# Patient Record
Sex: Male | Born: 1970
Health system: Southern US, Community
[De-identification: ages and names within clinical notes are randomized; demographics above are authoritative.]

## PROBLEM LIST (undated history)

## (undated) DIAGNOSIS — E119 Type 2 diabetes mellitus without complications: Secondary | ICD-10-CM

## (undated) DIAGNOSIS — F32A Depression, unspecified: Secondary | ICD-10-CM

## (undated) DIAGNOSIS — T7840XA Allergy, unspecified, initial encounter: Secondary | ICD-10-CM

## (undated) DIAGNOSIS — E785 Hyperlipidemia, unspecified: Secondary | ICD-10-CM

## (undated) DIAGNOSIS — F419 Anxiety disorder, unspecified: Secondary | ICD-10-CM

## (undated) DIAGNOSIS — G43909 Migraine, unspecified, not intractable, without status migrainosus: Secondary | ICD-10-CM

## (undated) DIAGNOSIS — F329 Major depressive disorder, single episode, unspecified: Secondary | ICD-10-CM

## (undated) DIAGNOSIS — F4000 Agoraphobia, unspecified: Secondary | ICD-10-CM

## (undated) HISTORY — DX: Migraine, unspecified, not intractable, without status migrainosus: G43.909

## (undated) HISTORY — DX: Allergy, unspecified, initial encounter: T78.40XA

## (undated) HISTORY — DX: Agoraphobia, unspecified: F40.00

## (undated) HISTORY — DX: Hyperlipidemia, unspecified: E78.5

## (undated) HISTORY — DX: Depression, unspecified: F32.A

## (undated) HISTORY — DX: Major depressive disorder, single episode, unspecified: F32.9

## (undated) HISTORY — DX: Anxiety disorder, unspecified: F41.9

## (undated) HISTORY — PX: TONSILLECTOMY: SUR1361

## (undated) HISTORY — DX: Type 2 diabetes mellitus without complications: E11.9

---

## 2013-11-13 ENCOUNTER — Ambulatory Visit: Payer: Self-pay | Admitting: Family Medicine

## 2014-10-04 ENCOUNTER — Ambulatory Visit (INDEPENDENT_AMBULATORY_CARE_PROVIDER_SITE_OTHER): Payer: BLUE CROSS/BLUE SHIELD | Admitting: Family Medicine

## 2014-10-04 ENCOUNTER — Encounter: Payer: Self-pay | Admitting: Family Medicine

## 2014-10-04 VITALS — BP 126/81 | HR 64 | Temp 98.7°F | Ht 71.5 in | Wt 263.0 lb

## 2014-10-04 DIAGNOSIS — M79601 Pain in right arm: Secondary | ICD-10-CM

## 2014-10-04 DIAGNOSIS — E669 Obesity, unspecified: Secondary | ICD-10-CM

## 2014-10-04 DIAGNOSIS — E785 Hyperlipidemia, unspecified: Secondary | ICD-10-CM | POA: Insufficient documentation

## 2014-10-04 DIAGNOSIS — E1165 Type 2 diabetes mellitus with hyperglycemia: Secondary | ICD-10-CM | POA: Insufficient documentation

## 2014-10-04 DIAGNOSIS — E119 Type 2 diabetes mellitus without complications: Secondary | ICD-10-CM

## 2014-10-04 DIAGNOSIS — E1129 Type 2 diabetes mellitus with other diabetic kidney complication: Secondary | ICD-10-CM | POA: Insufficient documentation

## 2014-10-04 MED ORDER — ESCITALOPRAM OXALATE 20 MG PO TABS: 20.0000 mg | ORAL_TABLET | Freq: Every day | ORAL | Status: DC

## 2014-10-04 MED ORDER — ALPRAZOLAM 0.5 MG PO TABS: 0.5000 mg | ORAL_TABLET | Freq: Two times a day (BID) | ORAL | Status: DC | PRN

## 2014-10-04 MED ORDER — TRAZODONE HCL 50 MG PO TABS: 50.0000 mg | ORAL_TABLET | Freq: Every day | ORAL | Status: DC

## 2014-10-04 NOTE — Assessment & Plan Note (Signed)
Pt does not want medicine, even though he's aware chol higher than goal; he wants to try weight loss and activity; check lipids at next appt

## 2014-10-04 NOTE — Assessment & Plan Note (Signed)
He will aim for ten pounds of weight loss by next visit

## 2014-10-04 NOTE — Patient Instructions (Addendum)
Check out the relaxation response by Dr. Remonia Richter [email or web address] Do tell your eye doctor about your new diagnosis of diabetes Activity goal:  You've told me you are going to start walking so good luck with that and stay motivated Aim for 6,000 steps a day (if you have a FitBit or other device) or walk 1.5 miles a day Let's have you return for visit and labs in 3 months Do check your feet every night Aim for ten pounds of weight loss between now and your next visit, that's less than 1 pound per week Good luck! I'll refer you to physical therapy for the right shoulder/arm for evaluation and teaching  Diabetes Mellitus and Food It is important for you to manage your blood sugar (glucose) level. Your blood glucose level can be greatly affected by what you eat. Eating healthier foods in the appropriate amounts throughout the day at about the same time each day will help you control your blood glucose level. It can also help slow or prevent worsening of your diabetes mellitus. Healthy eating may even help you improve the level of your blood pressure and reach or maintain a healthy weight.  HOW CAN FOOD AFFECT ME? Carbohydrates Carbohydrates affect your blood glucose level more than any other type of food. Your dietitian will help you determine how many carbohydrates to eat at each meal and teach you how to count carbohydrates. Counting carbohydrates is important to keep your blood glucose at a healthy level, especially if you are using insulin or taking certain medicines for diabetes mellitus. Alcohol Alcohol can cause sudden decreases in blood glucose (hypoglycemia), especially if you use insulin or take certain medicines for diabetes mellitus. Hypoglycemia can be a life-threatening condition. Symptoms of hypoglycemia (sleepiness, dizziness, and disorientation) are similar to symptoms of having too much alcohol.  If your health care provider has given you approval to  drink alcohol, do so in moderation and use the following guidelines:  Women should not have more than one drink per day, and men should not have more than two drinks per day. One drink is equal to:  12 oz of beer.  5 oz of wine.  1 oz of hard liquor.  Do not drink on an empty stomach.  Keep yourself hydrated. Have water, diet soda, or unsweetened iced tea.  Regular soda, juice, and other mixers might contain a lot of carbohydrates and should be counted. WHAT FOODS ARE NOT RECOMMENDED? As you make food choices, it is important to remember that all foods are not the same. Some foods have fewer nutrients per serving than other foods, even though they might have the same number of calories or carbohydrates. It is difficult to get your body what it needs when you eat foods with fewer nutrients. Examples of foods that you should avoid that are high in calories and carbohydrates but low in nutrients include:  Trans fats (most processed foods list trans fats on the Nutrition Facts label).  Regular soda.  Juice.  Candy.  Sweets, such as cake, pie, doughnuts, and cookies.  Fried foods. WHAT FOODS CAN I EAT? Have nutrient-rich foods, which will nourish your body and keep you healthy. The food you should eat also will depend on several factors, including:  The calories you need.  The medicines you take.  Your weight.  Your blood glucose level.  Your blood pressure level.  Your cholesterol level. You also should eat a variety of foods, including:  Protein, such as meat, poultry, fish, tofu,  nuts, and seeds (lean animal proteins are best).  Fruits.  Vegetables.  Dairy products, such as milk, cheese, and yogurt (low fat is best).  Breads, grains, pasta, cereal, rice, and beans.  Fats such as olive oil, trans fat-free margarine, canola oil, avocado, and olives. DOES EVERYONE WITH DIABETES MELLITUS HAVE THE SAME MEAL PLAN? Because every person with diabetes mellitus is  different, there is not one meal plan that works for everyone. It is very important that you meet with a dietitian who will help you create a meal plan that is just right for you. Document Released: 01/01/2005 Document Revised: 04/11/2013 Document Reviewed: 03/03/2013 [facility] Patient Information 2015 [place], [place]. This information is not intended to replace advice given to you by your health care provider. Make sure you discuss any questions you have with your health care provider.

## 2014-10-04 NOTE — Assessment & Plan Note (Signed)
Importance of foot exam discussed; foot exam by MD today done; pt will see his eye doctor in Sept or Oct and let him/her know of new diagnosis; A1C at next visit; he'll work on weight loss and activity and healthier eating

## 2014-10-04 NOTE — Progress Notes (Signed)
BP 126/81 mmHg  Pulse 64  Temp(Src) 98.7 F (37.1 C)  Ht 5' 11.5" (1.816 m)  Wt 263 lb (119.296 kg)  BMI 36.17 kg/m2  SpO2 96%   Subjective:    Patient ID: Cipriano Mile, male    DOB: 1971-04-13, 44 y.o.   MRN: [id number]  HPI: Raesean Bartoletti is a 44 y.o. male  Chief Complaint  Patient presents with  . Diabetes  . Depression   Diabetes mellitus type 2 Patient has had diabetes since March 2016 Does patient feel additional teaching/training would be helpful?  yes but expensive so he never went to the diabetic educator Trying to limit white bread, white rice, white potatoes, sweets?  no, it's hit or miss, likes mashed potatoes Trying to limit sweetened drinks like iced tea, soft drinks, sports drinks, fruit juices?  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 and doesn't want to work Last eye exam:  Wears contacts, due in Sept or October Diabetes-associated complications:  none Struggles include eating out, back to eating what he wants  High cholesterol; he does not want medicine; he wants to try weight loss and healthier eating  Anxiety; used to have a serious phobia of needles and didn't even have doctor's visits for over a decade; this past week with the sunshine was fine; was having more anxiety with working short-handed and more responsibility at work; went two or three days without medicine  Relevant past medical, surgical, family and social history reviewed and updated as indicated. Interim medical history since our last visit reviewed. Allergies and medications reviewed and updated.  Review of Systems  Constitutional: Negative for unexpected weight change.  Musculoskeletal:       Pain in the right arm, head of the tricep, sore with certain movements; going on for months; happened a  long time ago and hurt for a year; can't reach behind his back; can't reach up ahead; last time it happened was 2006 or so  Neurological: Negative for numbness.  Psychiatric/Behavioral: The patient is nervous/anxious.    Per HPI unless specifically indicated above     Objective:    BP 126/81 mmHg  Pulse 64  Temp(Src) 98.7 F (37.1 C)  Ht 5' 11.5" (1.816 m)  Wt 263 lb (119.296 kg)  BMI 36.17 kg/m2  SpO2 96%  Wt Readings from Last 3 Encounters:  10/04/14 263 lb (119.296 kg)  November 13, 2068 261 lb (118.389 kg) (100 %*, Z = 48.47)   * Growth percentiles are based on WHO (Boys, 0-2 years) data.    Body mass index is 36.17 kg/(m^2). Body mass index is 36.17 kg/(m^2). Body mass index is 36.17 kg/(m^2). Body mass index is 36.17 kg/(m^2).  Physical Exam  Constitutional: He appears well-developed and well-nourished. No distress.  HENT:  Head: Normocephalic and atraumatic.  Nose: No rhinorrhea.  Mouth/Throat: Mucous membranes are normal.  Eyes: EOM are normal. No scleral icterus.  Neck: No JVD present.  Cardiovascular: Normal rate, regular rhythm and normal heart sounds.   No murmur heard. Pulmonary/Chest: Effort normal and breath sounds normal. No respiratory distress. He has no wheezes.  Abdominal: Soft. Bowel sounds are normal. He exhibits no distension. There is no tenderness.  Musculoskeletal: He exhibits no edema.  Limited ROM with abduction of right arm and reaching behind back  Neurological: He is alert.  He displays no tremor. He exhibits normal muscle tone.  Strength UE 5/5  Skin: Skin is warm and dry. No rash noted. He is not diaphoretic. No cyanosis.  Psychiatric: He has a normal mood and affect. His behavior is normal. Judgment and thought content normal.  Nursing note and vitals reviewed.  Diabetic Foot Form - Detailed   Diabetic Foot Exam - detailed  Diabetic Foot exam was performed with the following findings:  Yes 10/04/2014  9:13 AM  Visual Foot Exam completed.:  Yes   Are the toenails long?:  No  Are the toenails thick?:  No    Pulse Foot Exam completed.:  Yes  Right Dorsalis Pedis:  Present Left Dorsalis Pedis:  Present  Sensory Foot Exam Completed.:  Yes  Semmes-Weinstein Monofilament Test  R Site 1-Great Toe:  Pos L Site 1-Great Toe:  Pos    Comments:  Normal to vibration testing as well     No results found for this or any previous visit.    Assessment & Plan:   Problem List Items Addressed This Visit      Endocrine   Type 2 diabetes mellitus - Primary    Importance of foot exam discussed; foot exam by MD today done; pt will see his eye doctor in Sept or Oct and let him/her know of new diagnosis; A1C at next visit; he'll work on weight loss and activity and healthier eating        Other   Hyperlipidemia    Pt does not want medicine, even though he's aware chol higher than goal; he wants to try weight loss and activity; check lipids at next appt      Obesity    He will aim for ten pounds of weight loss by next visit       Other Visit Diagnoses    Right arm pain        likely rotator cuff pathology; recommended referral to PT for evaluation and learning home exercises    Relevant Orders    Ambulatory referral to Physical Therapy        Follow up plan: Return in about 3 months (around 01/04/2015) for fasting labs and visit with Dr. Sanda Klein.

## 2014-10-31 ENCOUNTER — Ambulatory Visit: Payer: BLUE CROSS/BLUE SHIELD

## 2014-11-06 ENCOUNTER — Ambulatory Visit: Payer: BLUE CROSS/BLUE SHIELD

## 2015-01-03 ENCOUNTER — Encounter: Payer: Self-pay | Admitting: Family Medicine

## 2015-01-03 ENCOUNTER — Ambulatory Visit (INDEPENDENT_AMBULATORY_CARE_PROVIDER_SITE_OTHER): Payer: BLUE CROSS/BLUE SHIELD | Admitting: Family Medicine

## 2015-01-03 VITALS — BP 119/76 | HR 63 | Temp 98.4°F | Ht 70.3 in | Wt 258.0 lb

## 2015-01-03 DIAGNOSIS — E119 Type 2 diabetes mellitus without complications: Secondary | ICD-10-CM

## 2015-01-03 DIAGNOSIS — F419 Anxiety disorder, unspecified: Secondary | ICD-10-CM | POA: Diagnosis not present

## 2015-01-03 DIAGNOSIS — R0982 Postnasal drip: Secondary | ICD-10-CM

## 2015-01-03 DIAGNOSIS — B001 Herpesviral vesicular dermatitis: Secondary | ICD-10-CM

## 2015-01-03 DIAGNOSIS — E785 Hyperlipidemia, unspecified: Secondary | ICD-10-CM

## 2015-01-03 DIAGNOSIS — E669 Obesity, unspecified: Secondary | ICD-10-CM

## 2015-01-03 DIAGNOSIS — R059 Cough, unspecified: Secondary | ICD-10-CM

## 2015-01-03 DIAGNOSIS — Z23 Encounter for immunization: Secondary | ICD-10-CM

## 2015-01-03 DIAGNOSIS — Z5181 Encounter for therapeutic drug level monitoring: Secondary | ICD-10-CM

## 2015-01-03 DIAGNOSIS — R05 Cough: Secondary | ICD-10-CM | POA: Diagnosis not present

## 2015-01-03 MED ORDER — ALPRAZOLAM 0.5 MG PO TABS: 0.5000 mg | ORAL_TABLET | Freq: Two times a day (BID) | ORAL | Status: DC | PRN

## 2015-01-03 NOTE — Assessment & Plan Note (Signed)
Lingering after sinus infection; try nasal corticosteroid, but call if cough persists after 1-2 weeks and we'll get CXR; patient agrees

## 2015-01-03 NOTE — Assessment & Plan Note (Signed)
Congratulated patient on weight loss; five pounds since last visit; keep up the great job

## 2015-01-03 NOTE — Assessment & Plan Note (Signed)
Check lipids today; avoid saturated fats 

## 2015-01-03 NOTE — Assessment & Plan Note (Signed)
Consider nasal corticosteroid

## 2015-01-03 NOTE — Assessment & Plan Note (Signed)
Offered topical; he declined and will use up what he has from before

## 2015-01-03 NOTE — Assessment & Plan Note (Signed)
Continue SSRI and intermittent benzo; no concern at this time for misuse or diversion; no alcohol with benzo; may return in 6 months

## 2015-01-03 NOTE — Assessment & Plan Note (Addendum)
Check A1C today; weight loss encouraged; foot exam done by MD today; limit sweets and simple carbs; yearly eye exam; offered and gave flu shot and PPSV-23

## 2015-01-03 NOTE — Progress Notes (Signed)
BP 119/76 mmHg  Pulse 63  Temp(Src) 98.4 F (36.9 C)  Ht 5' 10.3" (1.786 m)  Wt 258 lb (117.028 kg)  BMI 36.69 kg/m2  SpO2 96%   Subjective:    Patient ID: Glenn Flynn, male    DOB: 03/06/1971, 44 y.o.   MRN: [id number]  HPI: Glenn Flynn is a 44 y.o. male  Chief Complaint  Patient presents with  . Diabetes  . Hyperlipidemia   Patient is here for follow-up of diabetes Diabetes mellitus type 2 Checking sugars?  no Does patient feel additional teaching/training would be helpful?  no; he just needs to discipline himself Trying to limit white bread, white rice, white potatoes, sweets?  yes, but girlfriend has a sweet tooth Trying to limit sweetened drinks like iced tea, soft drinks, sports drinks, fruit juices?  no; drinks sweet tea, that's his biggest thing he says, occasional soft drink Last eye exam:  One year ago, he'll schedule Diabetes-associated complications:  none  Both parents are diabetic  High cholesterol Dietary intake: Do you eat more than 3 eggs per week  no Do you try to limit saturated fats in your diet?  yes; does a variety of meals through the week, but does eat some a few times a week Exercise: Exercise/activity level:  seldom (a few times a month) Does high cholesterol run in your family?   YES, probably Weight:  If overweight or obese, are you trying to lose weight?  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 Interim medical history since our last visit  reviewed. Allergies and medications reviewed and updated.  Review of Systems  Constitutional: Negative for unexpected weight change.  HENT: Positive for postnasal drip.   Respiratory: Positive for cough.   Cardiovascular: Negative for chest pain.  Neurological: Negative for numbness.   Per HPI unless specifically indicated above     Objective:    BP 119/76 mmHg  Pulse 63  Temp(Src) 98.4 F (36.9 C)  Ht 5' 10.3" (1.786 m)  Wt 258 lb (117.028 kg)  BMI 36.69 kg/m2  SpO2 96%  Wt Readings from Last 3 Encounters:  01/03/15 258 lb (117.028 kg)  10/04/14 263 lb (119.296 kg)  03/09/69 261 lb (118.389 kg) (100 %*, Z = 48.47)   * Growth percentiles are based on WHO (Boys, 0-2 years) data.    Physical Exam  Constitutional: He appears well-developed and well-nourished. No distress.  HENT:  Head: Normocephalic and atraumatic.  Eyes: EOM are normal. No scleral icterus.  Neck: No thyromegaly present.  Cardiovascular: Normal rate and regular rhythm.   Pulmonary/Chest: Effort normal and breath sounds normal.  Abdominal: Soft. Bowel sounds are normal. He exhibits no distension.  Musculoskeletal: He exhibits no edema.  Neurological: Coordination normal.  Skin: Skin is warm and dry. Lesion (scabbed lesion left side lower lip) noted. No pallor.  Psychiatric: He has a normal mood and affect. His behavior is normal. Judgment and thought content normal.   Diabetic Foot Form - Detailed  Diabetic Foot Exam - detailed  Diabetic Foot exam was performed with the following findings:  Yes 01/03/2015  9:03 AM  Visual Foot Exam completed.:  Yes  Are the shoes appropriate in style and fit?:  Yes  Are the toenails long?:  No  Are the toenails thick?:  No  Is there a claw toe deformity?:  No  Are the toenails ingrown?:  No    Pulse Foot Exam completed.:  Yes  Right Dorsalis Pedis:  Present Left Dorsalis Pedis:  Present  Sensory Foot Exam Completed.:  Yes  Swelling:  No  Semmes-Weinstein  Monofilament Test  R Site 1-Great Toe:  Pos L Site 1-Great Toe:  Pos  R Site 4:  Pos L Site 4:  Pos  R Site 5:  Pos L Site 5:  Pos        No results found for this or any previous visit.    Assessment & Plan:   Problem List Items Addressed This Visit      Digestive   Fever blister    Offered topical; he declined and will use up what he has from before        Endocrine   Type 2 diabetes mellitus - Primary    Check A1C today; weight loss encouraged; foot exam done by MD today; limit sweets and simple carbs; yearly eye exam; offered and gave flu shot and PPSV-23      Relevant Orders   Hgb A1c w/o eAG     Other   Hyperlipidemia    Check lipids today; avoid saturated fats      Relevant Orders   Lipid Panel w/o Chol/HDL Ratio   Obesity    Congratulated patient on weight loss; five pounds since last visit; keep up the great job      Cough    Lingering after sinus infection; try nasal corticosteroid, but call if cough persists after 1-2 weeks and we'll get CXR; patient agrees      Postnasal drip    Consider nasal corticosteroid      Anxiety disorder    Continue SSRI and intermittent benzo; no concern at this time for misuse or diversion; no alcohol with benzo; may return in 6 months       Other Visit Diagnoses    Immunization due        Relevant Orders    Flu vaccine greater than or equal to 3yo preservative free IM (Completed)    Pneumococcal polysaccharide vaccine 23-valent greater than or equal to 2yo subcutaneous/IM (Completed)    Medication monitoring encounter        Relevant Orders    Comprehensive metabolic panel    Lipid Panel w/o Chol/HDL Ratio       Meds ordered this encounter  Medications  . ALPRAZolam (XANAX) 0.5 MG tablet    Sig: Take 1 tablet (0.5 mg total) by mouth 2 (two) times daily as needed for anxiety.    Dispense:  30 tablet    Refill:  2     Follow up plan: Return in about 6 months (around 07/03/2015) for diabetes, cholesterol,  anxiety.

## 2015-01-03 NOTE — Patient Instructions (Addendum)
PPSV-23 given today; pneumonia vaccine; you will not need another pneumonia vaccine until you turn 65 Flu shot given today; next due next fall, 2017 We'll contact you about the labs Keep up the good efforts and healthy lifestyle change Do schedule an eye exam and see your eye doctor yearly Call me for a chest xray if not getting better in 1-2 weeks Consider using nasal spray in case you are having postnasal drip Do not drink any alcohol within four hours of using your anxiety medicine (alprazolam) and do not mix that EVER with any narcotic pain medicine (in case you are given a narcotic for orthopaedic, dental, surgical issue); mixing benzodiazepines like alprazolam and narcotic pain medicine can result in respiratory depression and death Try to increase your activity level, building up gradually to 150 minutes per week of moderate intensity activity Return in 6 months

## 2015-01-04 LAB — COMPREHENSIVE METABOLIC PANEL
A/G RATIO: 1.6 (ref 1.1–2.5)
ALBUMIN: 4.2 g/dL (ref 3.5–5.5)
ALT: 41 IU/L (ref 0–44)
AST: 24 IU/L (ref 0–40)
Alkaline Phosphatase: 68 IU/L (ref 39–117)
BUN / CREAT RATIO: 19 (ref 9–20)
BUN: 18 mg/dL (ref 6–24)
Bilirubin Total: 0.6 mg/dL (ref 0.0–1.2)
CALCIUM: 9.7 mg/dL (ref 8.7–10.2)
CO2: 23 mmol/L (ref 18–29)
CREATININE: 0.93 mg/dL (ref 0.76–1.27)
Chloride: 100 mmol/L (ref 97–108)
GFR, EST AFRICAN AMERICAN: 114 mL/min/{1.73_m2} (ref >59)
GFR, EST NON AFRICAN AMERICAN: 99 mL/min/{1.73_m2} (ref >59)
GLOBULIN, TOTAL: 2.6 g/dL (ref 1.5–4.5)
Glucose: 142 mg/dL — ABNORMAL HIGH (ref 65–99)
POTASSIUM: 5.1 mmol/L (ref 3.5–5.2)
SODIUM: 138 mmol/L (ref 134–144)
Total Protein: 6.8 g/dL (ref 6.0–8.5)

## 2015-01-04 LAB — LIPID PANEL W/O CHOL/HDL RATIO
Cholesterol, Total: 220 mg/dL — ABNORMAL HIGH (ref 100–199)
HDL: 45 mg/dL (ref >39)
LDL CALC: 142 mg/dL — AB (ref 0–99)
TRIGLYCERIDES: 166 mg/dL — AB (ref 0–149)
VLDL Cholesterol Cal: 33 mg/dL (ref 5–40)

## 2015-01-04 LAB — HGB A1C W/O EAG: HEMOGLOBIN A1C: 6.7 % — AB (ref 4.8–5.6)

## 2015-01-15 ENCOUNTER — Telehealth: Payer: Self-pay | Admitting: Family Medicine

## 2015-01-15 NOTE — Telephone Encounter (Signed)
I called to discuss lab results; reached identified voicemail; will call him another time A1C shows good control at 6.7; suggest metformin to help with weight loss, glucose control LDL too high; suggest starting statin, LDL 142 CMP okay except for glucose

## 2015-01-23 NOTE — Telephone Encounter (Signed)
I left another message 

## 2015-01-25 NOTE — Telephone Encounter (Signed)
I left another message I keep missing him, so I asked him to call me Monday here at the office and staff may pull me from a room

## 2015-01-29 ENCOUNTER — Encounter: Payer: Self-pay | Admitting: Family Medicine

## 2015-01-29 MED ORDER — ATORVASTATIN CALCIUM 20 MG PO TABS: 20.0000 mg | ORAL_TABLET | Freq: Every day | ORAL | Status: DC

## 2015-01-29 MED ORDER — METFORMIN HCL ER 500 MG PO TB24: 500.0000 mg | ORAL_TABLET | Freq: Every day | ORAL | Status: DC

## 2015-01-29 NOTE — Telephone Encounter (Signed)
I have not heard back from this patient, so I am going to send him a letter

## 2015-02-01 NOTE — Telephone Encounter (Signed)
Left message; will call in rx incase he did not get the letter.

## 2015-02-01 NOTE — Telephone Encounter (Signed)
Pt would like to try the medication that was suggested and would like it to go to [facility].

## 2015-02-01 NOTE — Telephone Encounter (Signed)
I stapled the prescription to the back of his letter personally, so I'm not sure what happened; you can call it in to pharmacy if something happened to that Rx

## 2015-02-01 NOTE — Telephone Encounter (Signed)
Dr. Sanda Klein, patient called back, would like to start medicine.

## 2015-02-01 NOTE — Telephone Encounter (Signed)
rx called into pharmacy

## 2015-04-03 ENCOUNTER — Encounter: Payer: Self-pay | Admitting: Family Medicine

## 2015-04-03 NOTE — Telephone Encounter (Signed)
Routing to provider  

## 2015-04-18 ENCOUNTER — Ambulatory Visit (INDEPENDENT_AMBULATORY_CARE_PROVIDER_SITE_OTHER): Payer: BLUE CROSS/BLUE SHIELD | Admitting: Family Medicine

## 2015-04-18 ENCOUNTER — Encounter: Payer: Self-pay | Admitting: Family Medicine

## 2015-04-18 VITALS — BP 131/84 | HR 72 | Temp 97.2°F | Ht 71.0 in | Wt 263.0 lb

## 2015-04-18 DIAGNOSIS — E119 Type 2 diabetes mellitus without complications: Secondary | ICD-10-CM

## 2015-04-18 DIAGNOSIS — R200 Anesthesia of skin: Secondary | ICD-10-CM

## 2015-04-18 DIAGNOSIS — E785 Hyperlipidemia, unspecified: Secondary | ICD-10-CM

## 2015-04-18 DIAGNOSIS — E669 Obesity, unspecified: Secondary | ICD-10-CM

## 2015-04-18 DIAGNOSIS — F411 Generalized anxiety disorder: Secondary | ICD-10-CM

## 2015-04-18 DIAGNOSIS — R202 Paresthesia of skin: Secondary | ICD-10-CM

## 2015-04-18 MED ORDER — METFORMIN HCL ER 500 MG PO TB24: 500.0000 mg | ORAL_TABLET | Freq: Every day | ORAL | Status: DC

## 2015-04-18 MED ORDER — ALPRAZOLAM 0.5 MG PO TABS: 0.5000 mg | ORAL_TABLET | Freq: Two times a day (BID) | ORAL | Status: DC | PRN

## 2015-04-18 NOTE — Progress Notes (Signed)
BP 131/84 mmHg  Pulse 72  Temp(Src) 97.2 F (36.2 C)  Ht 5\' 11"  (1.803 m)  Wt 263 lb (119.296 kg)  BMI 36.70 kg/m2  SpO2 97%   Subjective:    Patient ID: Glenn Flynn, male    DOB: 05/04/1970, 44 y.o.   MRN: [id number]  HPI: Glenn Flynn is a 44 y.o. male  Chief Complaint  Patient presents with  . Diabetes    I need refills on my meds  . Hyperlipidemia  . Anxiety   Diabetes; not much dry mouth; not getting up at night to urinate; no nocturia; does try to limit sweets, but still drinks sweet tea with dinner, coffee with sugar substitutes, milk and carnation instant breakfast; not much bread, fiance buys the bread, he can go either way; we offered referral to diabetic educator, but he politely declined because of cost; taking metformin but forgets sometimes  High cholesterol; reviewed last labs; occasional eggs on the weekend; he did not refill the cholesterol medicine; he'd like to see the next labs  Tingling in the feet at times, might be positional; it seems to happen when he is sitting in higher chairs, may be more pressure against the backs of his legs/knees; no tingling around the mouth  Anxiety;  GAD 7 : Generalized Anxiety Score 04/18/2015  Nervous, Anxious, on Edge 3  Control/stop worrying 1  Worry too much - different things 1  Trouble relaxing 0  Restless 0  Easily annoyed or irritable 0  Afraid - awful might happen 0  Total GAD 7 Score 5  Anxiety Difficulty Somewhat difficult  anxiety seems to be holding steady, not any better, not any worse; sometimes dreading going to work, interactions with people He has tried counseling, did not work as he hoped He actually tried the rapid eye movement treatment, did not have good connection Taking medicine, no side effects from that Takes medicine every morning; he thinks it is as good as it can be now Using lexapro and trazodone and those are working well  Relevant past medical, surgical, family and social  history reviewed and updated as indicated. Interim medical history since our last visit reviewed. Allergies and medications reviewed and updated.  Review of Systems Per HPI unless specifically indicated above     Objective:    BP 131/84 mmHg  Pulse 72  Temp(Src) 97.2 F (36.2 C)  Ht 5\' 11"  (1.803 m)  Wt 263 lb (119.296 kg)  BMI 36.70 kg/m2  SpO2 97%  Wt Readings from Last 3 Encounters:  04/18/15 263 lb (119.296 kg)  01/03/15 258 lb (117.028 kg)  10/04/14 263 lb (119.296 kg)    Physical Exam  Constitutional: He appears well-developed and well-nourished. No distress.  Obese, weight gain of 5 pounds in last 3 months  HENT:  Head: Normocephalic and atraumatic.  Mouth/Throat: Mucous membranes are normal.  Eyes: EOM are normal. No scleral icterus.  Neck: No thyromegaly present.  Cardiovascular: Normal rate and regular rhythm.   Pulmonary/Chest: Effort normal and breath sounds normal.  Abdominal: Soft. Bowel sounds are normal. He exhibits no distension.  Musculoskeletal: He exhibits no edema.  Neurological: He displays no tremor. Coordination normal.  Skin: Skin is warm and dry. No rash noted. No pallor.  Psychiatric: He has a normal mood and affect. His behavior is normal. Judgment and thought content normal.  Good eye contact with examiner   Diabetic Foot Form - Detailed   Diabetic Foot Exam - detailed  Diabetic Foot exam  was performed with the following findings:  Yes 04/18/2015  5:13 PM  Visual Foot Exam completed.:  Yes  Are the toenails long?:  No  Are the toenails thick?:  No  Is there a claw toe deformity?:  No  Is there elevated skin temparature?:  No  Is there limited skin dorsiflexion?:  No  Are the toenails ingrown?:  No  Normal Range of Motion:  Yes    Pulse Foot Exam completed.:  Yes  Right Dorsalis Pedis:  Present Left Dorsalis Pedis:  Present  Sensory Foot Exam Completed.:  Yes  Swelling:  No  Semmes-Weinstein Monofilament Test  R Site 1-Great Toe:   Pos L Site 1-Great Toe:  Pos  R Site 4:  Pos L Site 4:  Pos  R Site 5:  Pos L Site 5:  Pos       Results for orders placed or performed in visit on 01/03/15  Comprehensive metabolic panel  Result Value Ref Range   Glucose 142 (H) 65 - 99 mg/dL   BUN 18 6 - 24 mg/dL   Creatinine, Ser 0.93 0.76 - 1.27 mg/dL   GFR calc non Af Amer 99 >59 mL/min/1.73   GFR calc Af Amer 114 >59 mL/min/1.73   BUN/Creatinine Ratio 19 9 - 20   Sodium 138 134 - 144 mmol/L   Potassium 5.1 3.5 - 5.2 mmol/L   Chloride 100 97 - 108 mmol/L   CO2 23 18 - 29 mmol/L   Calcium 9.7 8.7 - 10.2 mg/dL   Total Protein 6.8 6.0 - 8.5 g/dL   Albumin 4.2 3.5 - 5.5 g/dL   Globulin, Total 2.6 1.5 - 4.5 g/dL   Albumin/Globulin Ratio 1.6 1.1 - 2.5   Bilirubin Total 0.6 0.0 - 1.2 mg/dL   Alkaline Phosphatase 68 39 - 117 IU/L   AST 24 0 - 40 IU/L   ALT 41 0 - 44 IU/L  Lipid Panel w/o Chol/HDL Ratio  Result Value Ref Range   Cholesterol, Total 220 (H) 100 - 199 mg/dL   Triglycerides 166 (H) 0 - 149 mg/dL   HDL 45 >39 mg/dL   VLDL Cholesterol Cal 33 5 - 40 mg/dL   LDL Calculated 142 (H) 0 - 99 mg/dL  Hgb A1c w/o eAG  Result Value Ref Range   Hgb A1c MFr Bld 6.7 (H) 4.8 - 5.6 %      Assessment & Plan:   Problem List Items Addressed This Visit      Endocrine   Type 2 diabetes mellitus ([place])    Foot exam by MD today; patient to check feet nightly; start aspirin therapy 81 mg coated daily (msg left for patient about this, realized he was not on aspirin after visit was over); last A1c reviewed; next A1c in 3 months; weight loss and healthy eating encouraged      Relevant Medications   metFORMIN (GLUCOPHAGE-XR) 500 MG 24 hr tablet   aspirin EC 81 MG tablet     Other   Hyperlipidemia    Check fasting lipids at f/u appt in 3 months; goal LDL under 100 at least; goal HDL 40+, goal TG less than 150; weight loss and healthier eating should help      Relevant Medications   aspirin EC 81 MG tablet   Obesity     Encouragement given for patient to start to get his weight back down; if he begins to pick up his activity, start low and build up slowly  Relevant Medications   metFORMIN (GLUCOPHAGE-XR) 500 MG 24 hr tablet   Anxiety disorder - Primary    Patient seems to be stable on current medicine regimen; he is not particular interested in counseling; suggested other options such as yoga; okay for refills prior to next appt      Numbness and tingling of both legs below knees    This sounds positional, as it occurs after seated in higher chairs; distal sensation intact in both feet; not typical for diabetic neuropathy; try changing positions, see if improves         Follow up plan: Return in 3 months (on 07/04/2015) for thirty minute follow-up with fasting labs.  Meds ordered this encounter  Medications  . ALPRAZolam (XANAX) 0.5 MG tablet    Sig: Take 1 tablet (0.5 mg total) by mouth 2 (two) times daily as needed for anxiety.    Dispense:  30 tablet    Refill:  2  . metFORMIN (GLUCOPHAGE-XR) 500 MG 24 hr tablet    Sig: Take 1 tablet (500 mg total) by mouth daily with supper.    Dispense:  30 tablet    Refill:  2  . aspirin EC 81 MG tablet    Sig: Take 1 tablet (81 mg total) by mouth daily.

## 2015-04-18 NOTE — Patient Instructions (Addendum)
Try to limit saturated fats in your diet (bologna, hot dogs, barbeque, cheeseburgers, hamburgers, steak, bacon, sausage, cheese, etc.) and get more fresh fruits, vegetables, and whole grains Limit eggs to no more than three per week if you can Return for fasting labs and visit on July 04, 2015

## 2015-04-20 DIAGNOSIS — R202 Paresthesia of skin: Secondary | ICD-10-CM

## 2015-04-20 DIAGNOSIS — R200 Anesthesia of skin: Secondary | ICD-10-CM | POA: Insufficient documentation

## 2015-04-20 MED ORDER — ASPIRIN EC 81 MG PO TBEC: 81.0000 mg | DELAYED_RELEASE_TABLET | Freq: Every day | ORAL | Status: DC

## 2015-04-20 NOTE — Assessment & Plan Note (Signed)
Encouragement given for patient to start to get his weight back down; if he begins to pick up his activity, start low and build up slowly

## 2015-04-20 NOTE — Assessment & Plan Note (Signed)
This sounds positional, as it occurs after seated in higher chairs; distal sensation intact in both feet; not typical for diabetic neuropathy; try changing positions, see if improves

## 2015-04-20 NOTE — Assessment & Plan Note (Signed)
Foot exam by MD today; patient to check feet nightly; start aspirin therapy 81 mg coated daily (msg left for patient about this, realized he was not on aspirin after visit was over); last A1c reviewed; next A1c in 3 months; weight loss and healthy eating encouraged

## 2015-04-20 NOTE — Assessment & Plan Note (Signed)
Patient seems to be stable on current medicine regimen; he is not particular interested in counseling; suggested other options such as yoga; okay for refills prior to next appt

## 2015-04-20 NOTE — Assessment & Plan Note (Signed)
Check fasting lipids at f/u appt in 3 months; goal LDL under 100 at least; goal HDL 40+, goal TG less than 150; weight loss and healthier eating should help

## 2015-07-04 ENCOUNTER — Encounter: Payer: Self-pay | Admitting: Family Medicine

## 2015-07-04 ENCOUNTER — Ambulatory Visit (INDEPENDENT_AMBULATORY_CARE_PROVIDER_SITE_OTHER): Payer: BLUE CROSS/BLUE SHIELD | Admitting: Family Medicine

## 2015-07-04 VITALS — BP 135/83 | HR 67 | Temp 97.5°F | Ht 70.5 in | Wt 269.0 lb

## 2015-07-04 DIAGNOSIS — Z5181 Encounter for therapeutic drug level monitoring: Secondary | ICD-10-CM | POA: Diagnosis not present

## 2015-07-04 DIAGNOSIS — F411 Generalized anxiety disorder: Secondary | ICD-10-CM | POA: Diagnosis not present

## 2015-07-04 DIAGNOSIS — E119 Type 2 diabetes mellitus without complications: Secondary | ICD-10-CM | POA: Diagnosis not present

## 2015-07-04 DIAGNOSIS — E785 Hyperlipidemia, unspecified: Secondary | ICD-10-CM

## 2015-07-04 DIAGNOSIS — G4700 Insomnia, unspecified: Secondary | ICD-10-CM | POA: Diagnosis not present

## 2015-07-04 DIAGNOSIS — R5383 Other fatigue: Secondary | ICD-10-CM | POA: Diagnosis not present

## 2015-07-04 DIAGNOSIS — E669 Obesity, unspecified: Secondary | ICD-10-CM | POA: Diagnosis not present

## 2015-07-04 DIAGNOSIS — Z79899 Other long term (current) drug therapy: Secondary | ICD-10-CM | POA: Insufficient documentation

## 2015-07-04 MED ORDER — ALPRAZOLAM 0.5 MG PO TABS: 0.5000 mg | ORAL_TABLET | Freq: Two times a day (BID) | ORAL | Status: DC | PRN

## 2015-07-04 MED ORDER — TRAZODONE HCL 100 MG PO TABS: 100.0000 mg | ORAL_TABLET | Freq: Every day | ORAL | Status: DC

## 2015-07-04 NOTE — Assessment & Plan Note (Signed)
Continue SSRI and benzo

## 2015-07-04 NOTE — Assessment & Plan Note (Signed)
He will try to lose weight and increase activity level

## 2015-07-04 NOTE — Assessment & Plan Note (Signed)
Will check TSH, CBC, vit D; he has already had sleep study without significant weight gain he says; he'll check with fiance to see if any snoring or sleep apnea

## 2015-07-04 NOTE — Progress Notes (Signed)
BP 135/83 mmHg  Pulse 67  Temp(Src) 97.5 F (36.4 C)  Ht 5' 10.5" (1.791 m)  Wt 269 lb (122.018 kg)  BMI 38.04 kg/m2  SpO2 95%   Subjective:    Patient ID: Glenn Flynn, male    DOB: 1970/11/06, 45 y.o.   MRN: [id number]  HPI: Glenn Flynn is a 45 y.o. male  Chief Complaint  Patient presents with  . Diabetes    follow up and labs  . Hyperlipidemia    follow up and labs  . Obesity    he's gained 6 lbs since his last visit   Type 2 diabetes; not checking FSBS; he did not do the education classes; he is not doing a good job with eating; he thinks it is just ingrained and orders and buys what he wants; started on aspirin; no problems with  Blood pressure has gone up; does not add salt to anything much; does not look at sodium content; more of a hot sauce and pepper; not exercising regularly; will try to get out walking out again  Depression and anxiety have been elevated; not sure if putting on weight because of anxiety and depression, or added weight worsening the anxiety; stomach has been bothering him, more frequent stools, not eating healthy; on escitalopram 20 mg daily; takes the alprazolam every morning (works in [employer]); takes the second one only if needed; not sedating at all, very gradual; he has tried counseling in the past, and he'd like to work that back into the schedule; didn't get much out of it before; he has never slept well, trouble as a kid keeping his eyes open getting on the bus; trouble staying awake 1st period classes; trazodone helps but never wakes up rested; he has not been told he snores, but then says his fiance has mentioned it; he has already had a sleep study; did not find sleep apnea, no rapid leg movement, no difficulty breathing; more mental he says  Relevant past medical, surgical, family and social history reviewed and updated as indicated. Interim medical history since our last visit reviewed. Allergies and medications reviewed and  updated.  Review of Systems Per HPI unless specifically indicated above     Objective:    BP 135/83 mmHg  Pulse 67  Temp(Src) 97.5 F (36.4 C)  Ht 5' 10.5" (1.791 m)  Wt 269 lb (122.018 kg)  BMI 38.04 kg/m2  SpO2 95%  Wt Readings from Last 3 Encounters:  07/04/15 269 lb (122.018 kg)  04/18/15 263 lb (119.296 kg)  01/03/15 258 lb (117.028 kg)    Physical Exam  Constitutional: He appears well-developed and well-nourished. No distress.  HENT:  Head: Normocephalic and atraumatic.  Eyes: EOM are normal. No scleral icterus.  Neck: No thyromegaly present.  Cardiovascular: Normal rate and regular rhythm.   Pulmonary/Chest: Effort normal and breath sounds normal.  Abdominal: Soft. Bowel sounds are normal. He exhibits no distension.  Musculoskeletal: He exhibits no edema.  Neurological: Coordination normal.  Skin: Skin is warm and dry. No pallor.  Psychiatric: He has a normal mood and affect. His behavior is normal. Judgment and thought content normal.   Diabetic Foot Form - Detailed   Diabetic Foot Exam - detailed  Diabetic Foot exam was performed with the following findings:  Yes 07/04/2015  8:42 AM  Visual Foot Exam completed.:  Yes  Are the toenails long?:  No  Are the toenails thick?:  No  Are the toenails ingrown?:  No  Normal Range of Motion:  Yes    Pulse Foot Exam completed.:  Yes  Right Dorsalis Pedis:  Present Left Dorsalis Pedis:  Present  Sensory Foot Exam Completed.:  Yes  Swelling:  No  Semmes-Weinstein Monofilament Test  R Site 1-Great Toe:  Pos L Site 1-Great Toe:  Pos  R Site 4:  Pos L Site 4:  Pos  R Site 5:  Pos L Site 5:  Pos        Results for orders placed or performed in visit on 01/03/15  Comprehensive metabolic panel  Result Value Ref Range   Glucose 142 (H) 65 - 99 mg/dL   BUN 18 6 - 24 mg/dL   Creatinine, Ser 0.93 0.76 - 1.27 mg/dL   GFR calc non Af Amer 99 >59 mL/min/1.73   GFR calc Af Amer 114 >59 mL/min/1.73   BUN/Creatinine Ratio 19  9 - 20   Sodium 138 134 - 144 mmol/L   Potassium 5.1 3.5 - 5.2 mmol/L   Chloride 100 97 - 108 mmol/L   CO2 23 18 - 29 mmol/L   Calcium 9.7 8.7 - 10.2 mg/dL   Total Protein 6.8 6.0 - 8.5 g/dL   Albumin 4.2 3.5 - 5.5 g/dL   Globulin, Total 2.6 1.5 - 4.5 g/dL   Albumin/Globulin Ratio 1.6 1.1 - 2.5   Bilirubin Total 0.6 0.0 - 1.2 mg/dL   Alkaline Phosphatase 68 39 - 117 IU/L   AST 24 0 - 40 IU/L   ALT 41 0 - 44 IU/L  Lipid Panel w/o Chol/HDL Ratio  Result Value Ref Range   Cholesterol, Total 220 (H) 100 - 199 mg/dL   Triglycerides 166 (H) 0 - 149 mg/dL   HDL 45 >39 mg/dL   VLDL Cholesterol Cal 33 5 - 40 mg/dL   LDL Calculated 142 (H) 0 - 99 mg/dL  Hgb A1c w/o eAG  Result Value Ref Range   Hgb A1c MFr Bld 6.7 (H) 4.8 - 5.6 %      Assessment & Plan:   Problem List Items Addressed This Visit      Endocrine   Type 2 diabetes mellitus ([facility]) - Primary    Check A1c and microalbumin:creatinine; weight loss encouraged; he does not want to add ACE-I at this time; he declined offer for diabetic education      Relevant Orders   Hgb A1c w/o eAG     Other   Hyperlipidemia    Check lipids; limit saturated fats; he will try to lose weight and increase his activity level      Relevant Orders   Lipid Panel w/o Chol/HDL Ratio   Obesity    He will try to lose weight and increase activity level      Anxiety disorder    Continue SSRI and benzo      Insomnia    Increase trazodone; consider another sleep study      Fatigue    Will check TSH, CBC, vit D; he has already had sleep study without significant weight gain he says; he'll check with fiance to see if any snoring or sleep apnea      Relevant Orders   CBC with Differential/Platelet   TSH   VITAMIN D 25 Hydroxy (Vit-D Deficiency, Fractures)   Medication monitoring encounter    Check creatinine, liver function      Relevant Orders   Comprehensive metabolic panel   Controlled substance agreement signed    For benzo use;  no concern  for misuse or diversion; Rx given for three month supply of alprazolam         Follow up plan: Return in about 3 months (around 10/04/2015) for diabetes, blood pressure, etc.  An after-visit summary was printed and given to the patient at [name].  Please see the patient instructions which may contain other information and recommendations beyond what is mentioned above in the assessment and plan.  Orders Placed This Encounter  Procedures  . Lipid Panel w/o Chol/HDL Ratio  . CBC with Differential/Platelet  . Hgb A1c w/o eAG  . Comprehensive metabolic panel  . TSH  . VITAMIN D 25 Hydroxy (Vit-D Deficiency, Fractures)   Meds ordered this encounter  Medications  . traZODone (DESYREL) 100 MG tablet    Sig: Take 1 tablet (100 mg total) by mouth at bedtime.    Dispense:  30 tablet    Refill:  5    Pharmacist -- we're increasing the dose  . ALPRAZolam (XANAX) 0.5 MG tablet    Sig: Take 1 tablet (0.5 mg total) by mouth 2 (two) times daily as needed for anxiety.    Dispense:  30 tablet    Refill:  2

## 2015-07-04 NOTE — Patient Instructions (Addendum)
Check out diabetes information from American Diabetes Association and familydoctor.org Consider agave syrup in your coffee instead of sugar, for example or artificial sweeteners Let me know if you'd like to attend the education classes (recommended) Check out the information at familydoctor.org entitled "What It Takes to Lose Weight" Try to lose between 1-2 pounds per week by taking in fewer calories and burning off more calories You can succeed by limiting portions, limiting foods dense in calories and fat, becoming more active, and drinking 8 glasses of water a day (64 ounces) Don't skip meals, especially breakfast, as skipping meals may alter your metabolism Do not use over-the-counter weight loss pills or gimmicks that claim rapid weight loss A healthy BMI (or body mass index) is between 18.5 and 24.9 You can calculate your ideal BMI at the [place] website ClubMonetize.fr Check out http://vang.com/ if you want to try a calorie count Let's get labs today If you have not heard anything from my staff in a week about any orders/referrals/studies from today, please contact us here to follow-up [phone] Increase the trazodone to 100 mg at bedtime Your goal blood pressure is less than 130 mmHg on top. Try to follow the DASH guidelines (DASH stands for Dietary Approaches to Stop Hypertension) Try to limit the sodium in your diet.  Ideally, consume less than 1.5 grams (less than 1,500mg ) per day. Do not add salt when cooking or at the table.  Check the sodium amount on labels when shopping, and choose items lower in sodium when given a choice. Avoid or limit foods that already contain a lot of sodium. Eat a diet rich in fruits and vegetables and whole grains. Call if BP staying above 130 on top Try to limit saturated fats in your diet (bologna, hot dogs, barbeque, cheeseburgers, hamburgers, steak, bacon, sausage, cheese, etc.) and get more fresh  fruits, vegetables, and whole grains

## 2015-07-04 NOTE — Assessment & Plan Note (Signed)
Check creatinine, liver function

## 2015-07-04 NOTE — Assessment & Plan Note (Addendum)
Check A1c and microalbumin:creatinine; weight loss encouraged; he does not want to add ACE-I at this time; he declined offer for diabetic education

## 2015-07-04 NOTE — Assessment & Plan Note (Addendum)
Check lipids; limit saturated fats; he will try to lose weight and increase his activity level

## 2015-07-04 NOTE — Assessment & Plan Note (Signed)
For benzo use; no concern for misuse or diversion; Rx given for three month supply of alprazolam

## 2015-07-04 NOTE — Assessment & Plan Note (Signed)
Increase trazodone; consider another sleep study

## 2015-07-05 LAB — CBC WITH DIFFERENTIAL/PLATELET
Basophils Absolute: 0 10*3/uL (ref 0.0–0.2)
Basos: 0 %
EOS (ABSOLUTE): 0.4 10*3/uL (ref 0.0–0.4)
EOS: 5 %
HEMATOCRIT: 45.4 % (ref 37.5–51.0)
HEMOGLOBIN: 15.1 g/dL (ref 12.6–17.7)
IMMATURE GRANS (ABS): 0 10*3/uL (ref 0.0–0.1)
Immature Granulocytes: 0 %
LYMPHS ABS: 2.5 10*3/uL (ref 0.7–3.1)
Lymphs: 29 %
MCH: 29.4 pg (ref 26.6–33.0)
MCHC: 33.3 g/dL (ref 31.5–35.7)
MCV: 89 fL (ref 79–97)
MONOCYTES: 7 %
Monocytes Absolute: 0.6 10*3/uL (ref 0.1–0.9)
NEUTROS ABS: 4.9 10*3/uL (ref 1.4–7.0)
Neutrophils: 59 %
Platelets: 286 10*3/uL (ref 150–379)
RBC: 5.13 x10E6/uL (ref 4.14–5.80)
RDW: 13.7 % (ref 12.3–15.4)
WBC: 8.4 10*3/uL (ref 3.4–10.8)

## 2015-07-05 LAB — COMPREHENSIVE METABOLIC PANEL
A/G RATIO: 1.6 (ref 1.2–2.2)
ALBUMIN: 4.2 g/dL (ref 3.5–5.5)
ALT: 33 IU/L (ref 0–44)
AST: 18 IU/L (ref 0–40)
Alkaline Phosphatase: 66 IU/L (ref 39–117)
BILIRUBIN TOTAL: 0.3 mg/dL (ref 0.0–1.2)
BUN / CREAT RATIO: 20 (ref 9–20)
BUN: 18 mg/dL (ref 6–24)
CALCIUM: 9.6 mg/dL (ref 8.7–10.2)
CHLORIDE: 101 mmol/L (ref 96–106)
CO2: 23 mmol/L (ref 18–29)
Creatinine, Ser: 0.92 mg/dL (ref 0.76–1.27)
GFR, EST AFRICAN AMERICAN: 115 mL/min/{1.73_m2} (ref >59)
GFR, EST NON AFRICAN AMERICAN: 99 mL/min/{1.73_m2} (ref >59)
GLUCOSE: 169 mg/dL — AB (ref 65–99)
Globulin, Total: 2.7 g/dL (ref 1.5–4.5)
Potassium: 5.1 mmol/L (ref 3.5–5.2)
Sodium: 140 mmol/L (ref 134–144)
TOTAL PROTEIN: 6.9 g/dL (ref 6.0–8.5)

## 2015-07-05 LAB — TSH: TSH: 1.92 u[IU]/mL (ref 0.450–4.500)

## 2015-07-05 LAB — LIPID PANEL W/O CHOL/HDL RATIO
Cholesterol, Total: 183 mg/dL (ref 100–199)
HDL: 41 mg/dL (ref >39)
LDL Calculated: 106 mg/dL — ABNORMAL HIGH (ref 0–99)
Triglycerides: 179 mg/dL — ABNORMAL HIGH (ref 0–149)
VLDL CHOLESTEROL CAL: 36 mg/dL (ref 5–40)

## 2015-07-05 LAB — VITAMIN D 25 HYDROXY (VIT D DEFICIENCY, FRACTURES): Vit D, 25-Hydroxy: 18.5 ng/mL — ABNORMAL LOW (ref 30.0–100.0)

## 2015-07-05 LAB — HGB A1C W/O EAG: Hgb A1c MFr Bld: 7.4 % — ABNORMAL HIGH (ref 4.8–5.6)

## 2015-07-09 ENCOUNTER — Encounter: Payer: Self-pay | Admitting: Unknown Physician Specialty

## 2015-07-09 ENCOUNTER — Ambulatory Visit (INDEPENDENT_AMBULATORY_CARE_PROVIDER_SITE_OTHER): Payer: BLUE CROSS/BLUE SHIELD | Admitting: Unknown Physician Specialty

## 2015-07-09 VITALS — BP 123/80 | HR 73 | Temp 98.3°F | Ht 70.3 in | Wt 269.8 lb

## 2015-07-09 DIAGNOSIS — H5789 Other specified disorders of eye and adnexa: Secondary | ICD-10-CM

## 2015-07-09 DIAGNOSIS — H578 Other specified disorders of eye and adnexa: Secondary | ICD-10-CM

## 2015-07-09 NOTE — Progress Notes (Signed)
BP 123/80 mmHg  Pulse 73  Temp(Src) 98.3 F (36.8 C)  Ht 5' 10.3" (1.786 m)  Wt 269 lb 12.8 oz (122.38 kg)  BMI 38.37 kg/m2  SpO2 94%   Subjective:    Patient ID: Glenn Flynn, male    DOB: 07/02/1970, 45 y.o.   MRN: [id number]  HPI: Glenn Flynn is a 45 y.o. male  Chief Complaint  Patient presents with  . Eye Problem    pt states he felt pressure behind left eye Saturday and states it was red when he woke up Sunday   Pt states that he has an eye that is sensitive to light.  Hurts to move his eye.  This began with pressure behind his eye 4 days ago and redness started the next day.  No change in symptoms for the next 3 days    Lab review shows recent blood sugar is stable.    Relevant past medical, surgical, family and social history reviewed and updated as indicated. Interim medical history since our last visit reviewed. Allergies and medications reviewed and updated.  Review of Systems  Per HPI unless specifically indicated above     Objective:    BP 123/80 mmHg  Pulse 73  Temp(Src) 98.3 F (36.8 C)  Ht 5' 10.3" (1.786 m)  Wt 269 lb 12.8 oz (122.38 kg)  BMI 38.37 kg/m2  SpO2 94%  Wt Readings from Last 3 Encounters:  07/09/15 269 lb 12.8 oz (122.38 kg)  07/04/15 269 lb (122.018 kg)  04/18/15 263 lb (119.296 kg)    Physical Exam  Constitutional: He is oriented to person, place, and time. He appears well-developed and well-nourished. No distress.  HENT:  Head: Normocephalic and atraumatic.  Eyes: EOM are normal. Pupils are equal, round, and reactive to light. Right eye exhibits no discharge. Left eye exhibits discharge. Left conjunctiva is injected. No scleral icterus.  Cardiovascular: Normal rate.   Pulmonary/Chest: Effort normal.  Abdominal: Normal appearance. There is no splenomegaly or hepatomegaly.  Musculoskeletal: Normal range of motion.  Neurological: He is alert and oriented to person, place, and time.  Skin: Skin is intact. No rash  noted. No pallor.  Psychiatric: He has a normal mood and affect. His behavior is normal. Judgment and thought content normal.    Results for orders placed or performed in visit on 07/04/15  Lipid Panel w/o Chol/HDL Ratio  Result Value Ref Range   Cholesterol, Total 183 100 - 199 mg/dL   Triglycerides 179 (H) 0 - 149 mg/dL   HDL 41 >39 mg/dL   VLDL Cholesterol Cal 36 5 - 40 mg/dL   LDL Calculated 106 (H) 0 - 99 mg/dL  CBC with Differential/Platelet  Result Value Ref Range   WBC 8.4 3.4 - 10.8 x10E3/uL   RBC 5.13 4.14 - 5.80 x10E6/uL   Hemoglobin 15.1 12.6 - 17.7 g/dL   Hematocrit 45.4 37.5 - 51.0 %   MCV 89 79 - 97 fL   MCH 29.4 26.6 - 33.0 pg   MCHC 33.3 31.5 - 35.7 g/dL   RDW 13.7 12.3 - 15.4 %   Platelets 286 150 - 379 x10E3/uL   Neutrophils 59 %   Lymphs 29 %   Monocytes 7 %   Eos 5 %   Basos 0 %   Neutrophils Absolute 4.9 1.4 - 7.0 x10E3/uL   Lymphocytes Absolute 2.5 0.7 - 3.1 x10E3/uL   Monocytes Absolute 0.6 0.1 - 0.9 x10E3/uL   EOS (ABSOLUTE) 0.4 0.0 - 0.4  x10E3/uL   Basophils Absolute 0.0 0.0 - 0.2 x10E3/uL   Immature Granulocytes 0 %   Immature Grans (Abs) 0.0 0.0 - 0.1 x10E3/uL  Hgb A1c w/o eAG  Result Value Ref Range   Hgb A1c MFr Bld 7.4 (H) 4.8 - 5.6 %  Comprehensive metabolic panel  Result Value Ref Range   Glucose 169 (H) 65 - 99 mg/dL   BUN 18 6 - 24 mg/dL   Creatinine, Ser 0.92 0.76 - 1.27 mg/dL   GFR calc non Af Amer 99 >59 mL/min/1.73   GFR calc Af Amer 115 >59 mL/min/1.73   BUN/Creatinine Ratio 20 9 - 20   Sodium 140 134 - 144 mmol/L   Potassium 5.1 3.5 - 5.2 mmol/L   Chloride 101 96 - 106 mmol/L   CO2 23 18 - 29 mmol/L   Calcium 9.6 8.7 - 10.2 mg/dL   Total Protein 6.9 6.0 - 8.5 g/dL   Albumin 4.2 3.5 - 5.5 g/dL   Globulin, Total 2.7 1.5 - 4.5 g/dL   Albumin/Globulin Ratio 1.6 1.2 - 2.2   Bilirubin Total 0.3 0.0 - 1.2 mg/dL   Alkaline Phosphatase 66 39 - 117 IU/L   AST 18 0 - 40 IU/L   ALT 33 0 - 44 IU/L  TSH  Result Value Ref Range    TSH 1.920 0.450 - 4.500 uIU/mL  VITAMIN D 25 Hydroxy (Vit-D Deficiency, Fractures)  Result Value Ref Range   Vit D, 25-Hydroxy 18.5 (L) 30.0 - 100.0 ng/mL      Assessment & Plan:   Problem List Items Addressed This Visit    None    Visit Diagnoses    Redness of eye, left    -  Primary    Painful eye to light and accomodation.  Suspect acute glaucome with pressure behind eye.  Appt with [facility] today at 1 p        Follow up plan: Appt with [facility] today

## 2015-07-10 ENCOUNTER — Other Ambulatory Visit: Payer: Self-pay | Admitting: Family Medicine

## 2015-07-10 MED ORDER — METFORMIN HCL ER 500 MG PO TB24: 1000.0000 mg | ORAL_TABLET | Freq: Every day | ORAL | Status: DC

## 2015-10-08 ENCOUNTER — Ambulatory Visit (INDEPENDENT_AMBULATORY_CARE_PROVIDER_SITE_OTHER): Payer: BLUE CROSS/BLUE SHIELD | Admitting: Family Medicine

## 2015-10-08 ENCOUNTER — Encounter: Payer: Self-pay | Admitting: Family Medicine

## 2015-10-08 VITALS — BP 133/87 | HR 63 | Temp 98.5°F | Ht 70.7 in | Wt 268.0 lb

## 2015-10-08 DIAGNOSIS — Z79899 Other long term (current) drug therapy: Secondary | ICD-10-CM

## 2015-10-08 DIAGNOSIS — E119 Type 2 diabetes mellitus without complications: Secondary | ICD-10-CM | POA: Diagnosis not present

## 2015-10-08 DIAGNOSIS — F411 Generalized anxiety disorder: Secondary | ICD-10-CM

## 2015-10-08 LAB — MICROALBUMIN, URINE WAIVED
CREATININE, URINE WAIVED: 100 mg/dL (ref 10–300)
Microalb, Ur Waived: 30 mg/L — ABNORMAL HIGH (ref 0–19)

## 2015-10-08 LAB — BAYER DCA HB A1C WAIVED: HB A1C: 7.3 % — AB (ref <7.0)

## 2015-10-08 MED ORDER — ESCITALOPRAM OXALATE 20 MG PO TABS: 20.0000 mg | ORAL_TABLET | Freq: Every day | ORAL | Status: DC

## 2015-10-08 MED ORDER — ALPRAZOLAM 0.5 MG PO TABS: 0.5000 mg | ORAL_TABLET | Freq: Two times a day (BID) | ORAL | Status: DC | PRN

## 2015-10-08 MED ORDER — METFORMIN HCL ER 500 MG PO TB24: 500.0000 mg | ORAL_TABLET | Freq: Every day | ORAL | Status: DC

## 2015-10-08 NOTE — Assessment & Plan Note (Signed)
A1c slightly better at 7.3. Has been forgetting his medicine. Will take it in the AM. Will continue on 500mg  as that is all he had been taking. Recheck in 3 months and adjust as needed. Work on diet and exercise.

## 2015-10-08 NOTE — Assessment & Plan Note (Signed)
Stable on current regimen. Continue current regimen. Continue to monitor. Recheck 3 months.

## 2015-10-08 NOTE — Progress Notes (Signed)
BP 133/87 mmHg  Pulse 63  Temp(Src) 98.5 F (36.9 C)  Ht 5' 10.7" (1.796 m)  Wt 268 lb (121.564 kg)  BMI 37.69 kg/m2  SpO2 96%   Subjective:    Patient ID: Glenn Flynn, male    DOB: [birth year], 45 y.o.   MRN: [id number]  HPI: Glenn Flynn is a 45 y.o. male who presents today changing providers within the office.   Chief Complaint  Patient presents with  . Anxiety  . Diabetes   DIABETES- A1c going in the wrong direction last time, up to 7.4. Has not been paying attention to his sugars. Has not been taking his medicine all the time because he forgets. 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 He notes that he is waiting for it to get better. Wakes up anxious and then gets better as the day goes on. Takes the xanax daily during the week, Very rarely takes two daily Duration: Chronic Anxious mood: yes  Excessive worrying: yes Irritability: no  Sweating: no Nausea: no Palpitations:yes Hyperventilation: yes Panic attacks: yes Agoraphobia: no  Obscessions/compulsions: no Depressed mood: no   GAD 7 : Generalized Anxiety Score 10/08/2015 04/18/2015  Nervous, Anxious, on Edge 3 3  Control/stop worrying 2 1  Worry too much - different things 2 1  Trouble relaxing 0 0  Restless 0 0  Easily annoyed or irritable 0 0  Afraid - awful might happen 0 0  Total GAD 7 Score 7 5  Anxiety Difficulty Not difficult at all Somewhat difficult   Anhedonia: no Weight changes:  no Insomnia: no   Hypersomnia: no Fatigue/loss of energy: no Feelings of worthlessness: no Feelings of guilt: no Impaired concentration/indecisiveness: no Suicidal ideations: no  Crying spells: no Recent Stressors/Life Changes: yes   Relationship problems: no   Family stress: yes     Financial stress: no    Job stress: yes    Recent death/loss: no  Relevant past medical, surgical, family and social history reviewed and updated as indicated. Interim medical history since our last visit reviewed. Allergies and medications reviewed and updated.  Review of Systems  Constitutional: Negative.   Respiratory: Negative.   Cardiovascular: Negative.   Psychiatric/Behavioral: Negative.     Per HPI unless specifically indicated above     Objective:    BP 133/87 mmHg  Pulse 63  Temp(Src) 98.5 F (36.9 C)  Ht 5' 10.7" (1.796 m)  Wt 268 lb (121.564 kg)  BMI 37.69 kg/m2  SpO2 96%  Wt Readings from Last 3 Encounters:  10/08/15 268 lb (121.564 kg)  07/09/15 269 lb 12.8 oz (122.38 kg)  07/04/15 269 lb (122.018 kg)    Physical Exam  Constitutional: He is oriented to person, place, and time. He appears well-developed and well-nourished. No distress.  HENT:  Head: Normocephalic and atraumatic.  Right Ear: Hearing normal.  Left Ear: Hearing normal.  Nose: Nose normal.  Eyes: Conjunctivae and lids are normal. Right eye exhibits no discharge. Left eye exhibits no discharge. No scleral icterus.  Pulmonary/Chest: Effort normal. No  respiratory distress.  Musculoskeletal: Normal range of motion.  Neurological: He is alert and oriented to person, place, and time.  Skin: Skin is warm, dry and intact. No rash noted. No erythema. No pallor.  Psychiatric: He has a normal mood and affect. His speech is normal and behavior is normal. Judgment and thought content normal. Cognition and memory are normal.  Nursing note and vitals reviewed.   Results for orders placed or performed in visit on  07/04/15  Lipid Panel w/o Chol/HDL Ratio  Result Value Ref Range   Cholesterol, Total 183 100 - 199 mg/dL   Triglycerides 179 (H) 0 - 149 mg/dL   HDL 41 >39 mg/dL   VLDL Cholesterol Cal 36 5 - 40 mg/dL   LDL Calculated 106 (H) 0 - 99 mg/dL  CBC with Differential/Platelet  Result Value Ref Range   WBC 8.4 3.4 - 10.8 x10E3/uL   RBC 5.13 4.14 - 5.80 x10E6/uL   Hemoglobin 15.1 12.6 - 17.7 g/dL   Hematocrit 45.4 37.5 - 51.0 %   MCV 89 79 - 97 fL   MCH 29.4 26.6 - 33.0 pg   MCHC 33.3 31.5 - 35.7 g/dL   RDW 13.7 12.3 - 15.4 %   Platelets 286 150 - 379 x10E3/uL   Neutrophils 59 %   Lymphs 29 %   Monocytes 7 %   Eos 5 %   Basos 0 %   Neutrophils Absolute 4.9 1.4 - 7.0 x10E3/uL   Lymphocytes Absolute 2.5 0.7 - 3.1 x10E3/uL   Monocytes Absolute 0.6 0.1 - 0.9 x10E3/uL   EOS (ABSOLUTE) 0.4 0.0 - 0.4 x10E3/uL   Basophils Absolute 0.0 0.0 - 0.2 x10E3/uL   Immature Granulocytes 0 %   Immature Grans (Abs) 0.0 0.0 - 0.1 x10E3/uL  Hgb A1c w/o eAG  Result Value Ref Range   Hgb A1c MFr Bld 7.4 (H) 4.8 - 5.6 %  Comprehensive metabolic panel  Result Value Ref Range   Glucose 169 (H) 65 - 99 mg/dL   BUN 18 6 - 24 mg/dL   Creatinine, Ser 0.92 0.76 - 1.27 mg/dL   GFR calc non Af Amer 99 >59 mL/min/1.73   GFR calc Af Amer 115 >59 mL/min/1.73   BUN/Creatinine Ratio 20 9 - 20   Sodium 140 134 - 144 mmol/L   Potassium 5.1 3.5 - 5.2 mmol/L   Chloride 101 96 - 106 mmol/L   CO2 23 18 - 29 mmol/L   Calcium 9.6 8.7 - 10.2 mg/dL   Total Protein 6.9 6.0 - 8.5 g/dL   Albumin 4.2 3.5 - 5.5 g/dL   Globulin, Total 2.7 1.5 - 4.5 g/dL   Albumin/Globulin Ratio 1.6 1.2 - 2.2   Bilirubin Total 0.3 0.0 - 1.2 mg/dL   Alkaline Phosphatase 66 39 - 117 IU/L   AST 18 0 - 40 IU/L   ALT 33 0 - 44 IU/L  TSH  Result Value Ref Range   TSH 1.920 0.450 - 4.500 uIU/mL  VITAMIN D 25 Hydroxy (Vit-D Deficiency, Fractures)  Result Value Ref Range   Vit D, 25-Hydroxy 18.5 (L) 30.0 - 100.0 ng/mL      Assessment & Plan:    Problem List Items Addressed This Visit      Endocrine   Type 2 diabetes mellitus (HCC) - Primary    A1c slightly better at 7.3. Has been forgetting his medicine. Will take it in the AM. Will continue on 500mg  as that is all he had been taking. Recheck in 3  months and adjust as needed. Work on diet and exercise.       Relevant Medications   metFORMIN (GLUCOPHAGE-XR) 500 MG 24 hr tablet   Other Relevant Orders   Bayer DCA Hb A1c Waived   Microalbumin, Urine Waived     Other   Anxiety disorder    Stable on current regimen. Continue current regimen. Continue to monitor. Recheck 3 months.       Controlled substance agreement signed    Resigned today with me as prescriber.           Follow up plan: Return in about 3 months (around 01/08/2016) for Physical.

## 2015-10-08 NOTE — Assessment & Plan Note (Signed)
Resigned today with me as prescriber.

## 2016-01-14 ENCOUNTER — Ambulatory Visit: Payer: BLUE CROSS/BLUE SHIELD | Admitting: Family Medicine

## 2016-01-16 ENCOUNTER — Encounter: Payer: Self-pay | Admitting: Family Medicine

## 2016-01-16 ENCOUNTER — Ambulatory Visit (INDEPENDENT_AMBULATORY_CARE_PROVIDER_SITE_OTHER): Payer: BLUE CROSS/BLUE SHIELD | Admitting: Family Medicine

## 2016-01-16 VITALS — BP 128/85 | HR 65 | Temp 99.1°F | Ht 70.5 in | Wt 264.0 lb

## 2016-01-16 DIAGNOSIS — E785 Hyperlipidemia, unspecified: Secondary | ICD-10-CM

## 2016-01-16 DIAGNOSIS — F411 Generalized anxiety disorder: Secondary | ICD-10-CM

## 2016-01-16 DIAGNOSIS — E119 Type 2 diabetes mellitus without complications: Secondary | ICD-10-CM | POA: Diagnosis not present

## 2016-01-16 DIAGNOSIS — G4700 Insomnia, unspecified: Secondary | ICD-10-CM

## 2016-01-16 DIAGNOSIS — Z0000 Encounter for general adult medical examination without abnormal findings: Secondary | ICD-10-CM

## 2016-01-16 DIAGNOSIS — Z23 Encounter for immunization: Secondary | ICD-10-CM | POA: Diagnosis not present

## 2016-01-16 LAB — UA/M W/RFLX CULTURE, ROUTINE
Bilirubin, UA: NEGATIVE
GLUCOSE, UA: NEGATIVE
KETONES UA: NEGATIVE
Leukocytes, UA: NEGATIVE
NITRITE UA: NEGATIVE
SPEC GRAV UA: 1.025 (ref 1.005–1.030)
UUROB: 0.2 mg/dL (ref 0.2–1.0)
pH, UA: 5 (ref 5.0–7.5)

## 2016-01-16 LAB — MICROALBUMIN, URINE WAIVED
Creatinine, Urine Waived: 200 mg/dL (ref 10–300)
Microalb, Ur Waived: 80 mg/L — ABNORMAL HIGH (ref 0–19)

## 2016-01-16 LAB — MICROSCOPIC EXAMINATION

## 2016-01-16 LAB — HEMOGLOBIN A1C: HEMOGLOBIN A1C: 7.2

## 2016-01-16 LAB — BAYER DCA HB A1C WAIVED: HB A1C: 7.2 % — AB (ref <7.0)

## 2016-01-16 LAB — MICROALBUMIN, URINE: Microalb, Ur: 80

## 2016-01-16 MED ORDER — ALPRAZOLAM 0.5 MG PO TABS: 0.5000 mg | ORAL_TABLET | Freq: Two times a day (BID) | ORAL | 2 refills | Status: DC | PRN

## 2016-01-16 MED ORDER — TRAZODONE HCL 100 MG PO TABS: 100.0000 mg | ORAL_TABLET | Freq: Every day | ORAL | 6 refills | Status: DC

## 2016-01-16 NOTE — Assessment & Plan Note (Signed)
Stable. Refills of medicine given today. Follow up 3 months.

## 2016-01-16 NOTE — Progress Notes (Addendum)
BP 128/85 (BP Location: Left Arm, Patient Position: Sitting, Cuff Size: Large)   Pulse 65   Temp 99.1 F (37.3 C)   Ht 5' 10.5" (1.791 m)   Wt 264 lb (119.7 kg)   SpO2 96%   BMI 37.34 kg/m    Subjective:    Patient ID: Glenn Flynn, male    DOB: October 26, 1970, 44 y.o.   MRN: [id number]  HPI: Glenn Flynn is a 45 y.o. male presenting on 01/16/2016 for comprehensive medical examination. Current medical complaints include:  DIABETES Hypoglycemic episodes:no Polydipsia/polyuria: no Visual disturbance: no Chest pain: no Paresthesias: no Glucose Monitoring: no Taking Insulin?: no Blood Pressure Monitoring: not checking Retinal Examination: Up to Date Foot Exam: Up to Date Diabetic Education: Completed Pneumovax: Up to Date Influenza: Up to Date Aspirin: no  ANXIETY/STRESS Duration:stable Anxious mood: yes  Excessive worrying: yes Irritability: no  Sweating: yes Nausea: no Palpitations:no Hyperventilation: no Panic attacks: no Agoraphobia: no  Obscessions/compulsions: no Depressed mood: no Depression screen PHQ 2/9 01/16/2016  Decreased Interest 0  Down, Depressed, Hopeless 0  PHQ - 2 Score 0   Anhedonia: no Weight changes: no Insomnia: yes hard to stay asleep  Hypersomnia: no Fatigue/loss of energy: yes Feelings of worthlessness: no Feelings of guilt: no Impaired concentration/indecisiveness: no Suicidal ideations: no  Crying spells: no Recent Stressors/Life Changes: no  He currently lives with: wife Interim Problems from his last visit: no  Past Medical History:  Past Medical History:  Diagnosis Date  . Agoraphobia   . Allergy   . Anxiety   . Depression   . Diabetes mellitus without complication ([place])   . Hyperlipidemia   . Migraines     Surgical History:  Past Surgical History:  Procedure Laterality Date  . TONSILLECTOMY  Age 73    Medications:  Current Outpatient Prescriptions on File Prior to Visit  Medication Sig  .  escitalopram (LEXAPRO) 20 MG tablet Take 1 tablet (20 mg total) by mouth daily.  Marland Kitchen glucose monitoring kit (FREESTYLE) monitoring kit 1 each by Does not apply route as needed for other.  . metFORMIN (GLUCOPHAGE-XR) 500 MG 24 hr tablet Take 1 tablet (500 mg total) by mouth daily with supper.   No current facility-administered medications on file prior to visit.     Allergies:  No Known Allergies  Social History:  Social History   Social History  . Marital status: Single    Spouse name: N/A  . Number of children: N/A  . Years of education: N/A   Occupational History  . Not on file.   Social History Main Topics  . Smoking status: Never Smoker  . Smokeless tobacco: Never Used  . Alcohol use No     Comment: rare  . Drug use: No  . Sexual activity: Not on file   Other Topics Concern  . Not on file   Social History Narrative  . No narrative on file   History  Smoking Status  . Never Smoker  Smokeless Tobacco  . Never Used   History  Alcohol Use No    Comment: rare    Family History:  Family History  Problem Relation Age of Onset  . Diabetes Mother   . Hypertension Mother   . Diabetes Father   . Hypertension Father   . Heart disease Maternal Grandmother   . Heart disease Maternal Grandfather   . Stroke Paternal Grandfather   . Cancer Neg Hx   . COPD Neg Hx  Past medical history, surgical history, medications, allergies, family history and social history reviewed with patient today and changes made to appropriate areas of the chart.   Review of Systems  Constitutional: Negative.        Has less hunger at night, no early satiety- still eating normal portions   HENT: Negative.   Eyes: Negative.   Respiratory: Positive for cough (was around for 2 months, now resolved). Negative for hemoptysis, sputum production, shortness of breath and wheezing.   Cardiovascular: Negative.   Gastrointestinal: Positive for diarrhea and heartburn (usually with coffee ).  Negative for abdominal pain, blood in stool, constipation, melena, nausea and vomiting.  Genitourinary: Negative.   Musculoskeletal: Negative.   Skin: Negative.   Neurological: Negative.   Endo/Heme/Allergies: Positive for environmental allergies. Negative for polydipsia. Does not bruise/bleed easily.  Psychiatric/Behavioral: Negative.     All other ROS negative except what is listed above and in the HPI.      Objective:    BP 128/85 (BP Location: Left Arm, Patient Position: Sitting, Cuff Size: Large)   Pulse 65   Temp 99.1 F (37.3 C)   Ht 5' 10.5" (1.791 m)   Wt 264 lb (119.7 kg)   SpO2 96%   BMI 37.34 kg/m   Wt Readings from Last 3 Encounters:  01/16/16 264 lb (119.7 kg)  10/08/15 268 lb (121.6 kg)  07/09/15 269 lb 12.8 oz (122.4 kg)    Physical Exam  Constitutional: He is oriented to person, place, and time. He appears well-developed and well-nourished. No distress.  HENT:  Head: Normocephalic and atraumatic.  Right Ear: Hearing, tympanic membrane, external ear and ear canal normal.  Left Ear: Hearing, tympanic membrane, external ear and ear canal normal.  Nose: Nose normal.  Mouth/Throat: Uvula is midline, oropharynx is clear and moist and mucous membranes are normal. No oropharyngeal exudate.  Eyes: Conjunctivae, EOM and lids are normal. Pupils are equal, round, and reactive to light. Right eye exhibits no discharge. Left eye exhibits no discharge. No scleral icterus.  Neck: Normal range of motion. Neck supple. No JVD present. No tracheal deviation present. No thyromegaly present.  Cardiovascular: Normal rate, regular rhythm, normal heart sounds and intact distal pulses.  Exam reveals no gallop and no friction rub.   No murmur heard. Pulmonary/Chest: Effort normal. No stridor. No respiratory distress. He has no wheezes. He has no rales. He exhibits no tenderness.  Abdominal: Soft. Bowel sounds are normal. He exhibits no distension and no mass. There is no tenderness.  There is no rebound and no guarding. Hernia confirmed negative in the right inguinal area and confirmed negative in the left inguinal area.  Genitourinary: Testes normal and penis normal. Right testis shows no mass, no swelling and no tenderness. Right testis is descended. Cremasteric reflex is not absent on the right side. Left testis shows no mass, no swelling and no tenderness. Left testis is descended. Cremasteric reflex is not absent on the left side. Circumcised. No penile tenderness.  Musculoskeletal: Normal range of motion. He exhibits no edema, tenderness or deformity.  Lymphadenopathy:    He has no cervical adenopathy.  Neurological: He is alert and oriented to person, place, and time. He has normal reflexes. He displays normal reflexes. No cranial nerve deficit. He exhibits normal muscle tone. Coordination normal.  Skin: Skin is warm, dry and intact. No rash noted. He is not diaphoretic. No erythema. No pallor.  Psychiatric: He has a normal mood and affect. His speech is normal and behavior  is normal. Judgment and thought content normal. Cognition and memory are normal.  Nursing note and vitals reviewed.   Results for orders placed or performed in visit on 01/16/16  Hemoglobin A1c  Result Value Ref Range   Hemoglobin A1C 7.2       Assessment & Plan:   Problem List Items Addressed This Visit      Endocrine   Type 2 diabetes mellitus (HCC)    A1c down to 7.2. Continue to work on diet and exercise. Continue current regimen.       Relevant Orders   CBC with Differential/Platelet   Comprehensive metabolic panel   Microalbumin, Urine Waived   UA/M w/rflx Culture, Routine   Bayer DCA Hb A1c Waived     Other   Hyperlipidemia    Rechecking labs today. Await results. Treat as needed.       Relevant Orders   CBC with Differential/Platelet   Comprehensive metabolic panel   Lipid Panel w/o Chol/HDL Ratio   Anxiety disorder    Stable. Refills of medicine given today. Follow up  3 months.       Relevant Orders   CBC with Differential/Platelet   Comprehensive metabolic panel   TSH   Insomnia    Stable on his trazodone. Refill given today. Continue to monitor.        Other Visit Diagnoses    Routine general medical examination at a health care facility    -  Primary   Up to date on vaccines. Screening labs checked today. Continue diet and exercise.    Relevant Orders   CBC with Differential/Platelet   Comprehensive metabolic panel   Lipid Panel w/o Chol/HDL Ratio   Microalbumin, Urine Waived   TSH   UA/M w/rflx Culture, Routine   Bayer DCA Hb A1c Waived   Immunization due       Relevant Orders   Flu Vaccine QUAD 36+ mos PF IM (Fluarix & Fluzone Quad PF) (Completed)       Discussed aspirin prophylaxis for myocardial infarction prevention and decision was it was not indicated  LABORATORY TESTING:  Health maintenance labs ordered today as discussed above.   IMMUNIZATIONS:   - Tdap: Tetanus vaccination status reviewed: last tetanus booster within 10 years. - Influenza: Administered today - Pneumovax: Up to date - Prevnar: Not applicable - Zostavax vaccine: Not applicable  PATIENT COUNSELING:    Sexuality: Discussed sexually transmitted diseases, partner selection, use of condoms, avoidance of unintended pregnancy  and contraceptive alternatives.   Advised to avoid cigarette smoking.  I discussed with the patient that most people either abstain from alcohol or drink within safe limits (<=14/week and <=4 drinks/occasion for males, <=7/weeks and <= 3 drinks/occasion for females) and that the risk for alcohol disorders and other health effects rises proportionally with the number of drinks per week and how often a drinker exceeds daily limits.  Discussed cessation/primary prevention of drug use and availability of treatment for abuse.   Diet: Encouraged to adjust caloric intake to maintain  or achieve ideal body weight, to reduce intake of dietary  saturated fat and total fat, to limit sodium intake by avoiding high sodium foods and not adding table salt, and to maintain adequate dietary potassium and calcium preferably from fresh fruits, vegetables, and low-fat dairy products.    stressed the importance of regular exercise  Injury prevention: Discussed safety belts, safety helmets, smoke detector, smoking near bedding or upholstery.   Dental health: Discussed importance of regular tooth brushing,  flossing, and dental visits.   Follow up plan: NEXT PREVENTATIVE PHYSICAL DUE IN 1 YEAR. Return in about 3 months (around 04/16/2016) for DM and Anxiety.

## 2016-01-16 NOTE — Assessment & Plan Note (Signed)
Stable on his trazodone. Refill given today. Continue to monitor.

## 2016-01-16 NOTE — Assessment & Plan Note (Signed)
A1c down to 7.2. Continue to work on diet and exercise. Continue current regimen.

## 2016-01-16 NOTE — Patient Instructions (Addendum)
Health Maintenance, Male A healthy lifestyle and preventative care can promote health and wellness.  Maintain regular health, dental, and eye exams.  Eat a healthy diet. Foods like vegetables, fruits, whole grains, low-fat dairy products, and lean protein foods contain the nutrients you need and are low in calories. Decrease your intake of foods high in solid fats, added sugars, and salt. Get information about a proper diet from your health care provider, if necessary.  Regular physical exercise is one of the most important things you can do for your health. Most adults should get at least 150 minutes of moderate-intensity exercise (any activity that increases your heart rate and causes you to sweat) each week. In addition, most adults need muscle-strengthening exercises on 2 or more days a week.   Maintain a healthy weight. The body mass index (BMI) is a screening tool to identify possible weight problems. It provides an estimate of body fat based on height and weight. Your health care provider can find your BMI and can help you achieve or maintain a healthy weight. For males 20 years and older:  A BMI below 18.5 is considered underweight.  A BMI of 18.5 to 24.9 is normal.  A BMI of 25 to 29.9 is considered overweight.  A BMI of 30 and above is considered obese.  Maintain normal blood lipids and cholesterol by exercising and minimizing your intake of saturated fat. Eat a balanced diet with plenty of fruits and vegetables. Blood tests for lipids and cholesterol should begin at age 20 and be repeated every 5 years. If your lipid or cholesterol levels are high, you are over age 50, or you are at high risk for heart disease, you may need your cholesterol levels checked more frequently.Ongoing high lipid and cholesterol levels should be treated with medicines if diet and exercise are not working.  If you smoke, find out from your health care provider how to quit. If you do not use tobacco, do not  start.  Lung cancer screening is recommended for adults aged 55-80 years who are at high risk for developing lung cancer because of a history of smoking. A yearly low-dose CT scan of the lungs is recommended for people who have at least a 30-pack-year history of smoking and are current smokers or have quit within the past 15 years. A pack year of smoking is smoking an average of 1 pack of cigarettes a day for 1 year (for example, a 30-pack-year history of smoking could mean smoking 1 pack a day for 30 years or 2 packs a day for 15 years). Yearly screening should continue until the smoker has stopped smoking for at least 15 years. Yearly screening should be stopped for people who develop a health problem that would prevent them from having lung cancer treatment.  If you choose to drink alcohol, do not have more than 2 drinks per day. One drink is considered to be 12 oz (360 mL) of beer, 5 oz (150 mL) of wine, or 1.5 oz (45 mL) of liquor.  Avoid the use of street drugs. Do not share needles with anyone. Ask for help if you need support or instructions about stopping the use of drugs.  High blood pressure causes heart disease and increases the risk of stroke. High blood pressure is more likely to develop in:  People who have blood pressure in the end of the normal range (100-139/85-89 mm Hg).  People who are overweight or obese.  People who are African American.    If you are 18-39 years of age, have your blood pressure checked every 3-5 years. If you are 40 years of age or older, have your blood pressure checked every year. You should have your blood pressure measured twice--once when you are at a hospital or clinic, and once when you are not at a hospital or clinic. Record the average of the two measurements. To check your blood pressure when you are not at a hospital or clinic, you can use:  An automated blood pressure machine at a pharmacy.  A home blood pressure monitor.  If you are 45-79 years  old, ask your health care provider if you should take aspirin to prevent heart disease.  Diabetes screening involves taking a blood sample to check your fasting blood sugar level. This should be done once every 3 years after age 45 if you are at a normal weight and without risk factors for diabetes. Testing should be considered at a younger age or be carried out more frequently if you are overweight and have at least 1 risk factor for diabetes.  Colorectal cancer can be detected and often prevented. Most routine colorectal cancer screening begins at the age of 50 and continues through age 75. However, your health care provider may recommend screening at an earlier age if you have risk factors for colon cancer. On a yearly basis, your health care provider may provide home test kits to check for hidden blood in the stool. A small camera at the end of a tube may be used to directly examine the colon (sigmoidoscopy or colonoscopy) to detect the earliest forms of colorectal cancer. Talk to your health care provider about this at age 50 when routine screening begins. A direct exam of the colon should be repeated every 5-10 years through age 75, unless early forms of precancerous polyps or small growths are found.  People who are at an increased risk for hepatitis B should be screened for this virus. You are considered at high risk for hepatitis B if:  You were born in a country where hepatitis B occurs often. Talk with your health care provider about which countries are considered high risk.  Your parents were born in a high-risk country and you have not received a shot to protect against hepatitis B (hepatitis B vaccine).  You have HIV or AIDS.  You use needles to inject street drugs.  You live with, or have sex with, someone who has hepatitis B.  You are a man who has sex with other men (MSM).  You get hemodialysis treatment.  You take certain medicines for conditions like cancer, organ  transplantation, and autoimmune conditions.  Hepatitis C blood testing is recommended for all people born from 1945 through 1965 and any individual with known risk factors for hepatitis C.  Healthy men should no longer receive prostate-specific antigen (PSA) blood tests as part of routine cancer screening. Talk to your health care provider about prostate cancer screening.  Testicular cancer screening is not recommended for adolescents or adult males who have no symptoms. Screening includes self-exam, a health care provider exam, and other screening tests. Consult with your health care provider about any symptoms you have or any concerns you have about testicular cancer.  Practice safe sex. Use condoms and avoid high-risk sexual practices to reduce the spread of sexually transmitted infections (STIs).  You should be screened for STIs, including gonorrhea and chlamydia if:  You are sexually active and are younger than 24 years.  You   are older than 24 years, and your health care provider tells you that you are at risk for this type of infection.  Your sexual activity has changed since you were last screened, and you are at an increased risk for chlamydia or gonorrhea. Ask your health care provider if you are at risk.  If you are at risk of being infected with HIV, it is recommended that you take a prescription medicine daily to prevent HIV infection. This is called pre-exposure prophylaxis (PrEP). You are considered at risk if:  You are a man who has sex with other men (MSM).  You are a heterosexual man who is sexually active with multiple partners.  You take drugs by injection.  You are sexually active with a partner who has HIV.  Talk with your health care provider about whether you are at high risk of being infected with HIV. If you choose to begin PrEP, you should first be tested for HIV. You should then be tested every 3 months for as long as you are taking PrEP.  Use sunscreen. Apply  sunscreen liberally and repeatedly throughout the day. You should seek shade when your shadow is shorter than you. Protect yourself by wearing long sleeves, pants, a wide-brimmed hat, and sunglasses year round whenever you are outdoors.  Tell your health care provider of new moles or changes in moles, especially if there is a change in shape or color. Also, tell your health care provider if a mole is larger than the size of a pencil eraser.  A one-time screening for abdominal aortic aneurysm (AAA) and surgical repair of large AAAs by ultrasound is recommended for men aged 68-75 years who are current or former smokers.  Stay current with your vaccines (immunizations).   This information is not intended to replace advice given to you by your health care provider. Make sure you discuss any questions you have with your health care provider.   Document Released: 10/03/2007 Document Revised: 04/27/2014 Document Reviewed: 09/01/2010 Elsevier Interactive Patient Education 2016 Elsevier Inc. Influenza (Flu) Vaccine (Inactivated or Recombinant):  1. Why get vaccinated? Influenza ("flu") is a contagious disease that spreads around the [place] every year, usually between October and May. Flu is caused by influenza viruses, and is spread mainly by coughing, sneezing, and close contact. Anyone can get flu. Flu strikes suddenly and can last several days. Symptoms vary by age, but can include:  fever/chills  sore throat  muscle aches  fatigue  cough  headache  runny or stuffy nose Flu can also lead to pneumonia and blood infections, and cause diarrhea and seizures in children. If you have a medical condition, such as heart or lung disease, flu can make it worse. Flu is more dangerous for some people. Infants and young children, people 24 years of age and older, pregnant women, and people with certain health conditions or a weakened immune system are at greatest risk. Each year thousands of  people in the [place] die from flu, and many more are hospitalized. Flu vaccine can:  keep you from getting flu,  make flu less severe if you do get it, and  keep you from spreading flu to your family and other people. 2. Inactivated and recombinant flu vaccines A dose of flu vaccine is recommended every flu season. Children 6 months through 60 years of age may need two doses during the same flu season. Everyone else needs only one dose each flu season. Some inactivated flu vaccines contain a very  small amount of a mercury-based preservative called thimerosal. Studies have not shown thimerosal in vaccines to be harmful, but flu vaccines that do not contain thimerosal are available. There is no live flu virus in flu shots. They cannot cause the flu. There are many flu viruses, and they are always changing. Each year a new flu vaccine is made to protect against three or four viruses that are likely to cause disease in the upcoming flu season. But even when the vaccine doesn't exactly match these viruses, it may still provide some protection. Flu vaccine cannot prevent:  flu that is caused by a virus not covered by the vaccine, or  illnesses that look like flu but are not. It takes about 2 weeks for protection to develop after vaccination, and protection lasts through the flu season. 3. Some people should not get this vaccine Tell the person who is giving you the vaccine:  If you have any severe, life-threatening allergies. If you ever had a life-threatening allergic reaction after a dose of flu vaccine, or have a severe allergy to any part of this vaccine, you may be advised not to get vaccinated. Most, but not all, types of flu vaccine contain a small amount of egg protein.  If you ever had Guillain-Barre Syndrome (also called GBS). Some people with a history of GBS should not get this vaccine. This should be discussed with your doctor.  If you are not feeling well. It is usually okay  to get flu vaccine when you have a mild illness, but you might be asked to come back when you feel better. 4. Risks of a vaccine reaction With any medicine, including vaccines, there is a chance of reactions. These are usually mild and go away on their own, but serious reactions are also possible. Most people who get a flu shot do not have any problems with it. Minor problems following a flu shot include:  soreness, redness, or swelling where the shot was given  hoarseness  sore, red or itchy eyes  cough  fever  aches  headache  itching  fatigue If these problems occur, they usually begin soon after the shot and last 1 or 2 days. More serious problems following a flu shot can include the following:  There may be a small increased risk of Guillain-Barre Syndrome (GBS) after inactivated flu vaccine. This risk has been estimated at 1 or 2 additional cases per million people vaccinated. This is much lower than the risk of severe complications from flu, which can be prevented by flu vaccine.  Young children who get the flu shot along with pneumococcal vaccine (PCV13) and/or DTaP vaccine at the same time might be slightly more likely to have a seizure caused by fever. Ask your doctor for more information. Tell your doctor if a child who is getting flu vaccine has ever had a seizure. Problems that could happen after any injected vaccine:  People sometimes faint after a medical procedure, including vaccination. Sitting or lying down for about 15 minutes can help prevent fainting, and injuries caused by a fall. Tell your doctor if you feel dizzy, or have vision changes or ringing in the ears.  Some people get severe pain in the shoulder and have difficulty moving the arm where a shot was given. This happens very rarely.  Any medication can cause a severe allergic reaction. Such reactions from a vaccine are very rare, estimated at about 1 in a million doses, and would happen within a few  minutes  to a few hours after the vaccination. As with any medicine, there is a very remote chance of a vaccine causing a serious injury or death. The safety of vaccines is always being monitored. For more information, visit: http://www.aguilar.org/ 5. What if there is a serious reaction? What should I look for?  Look for anything that concerns you, such as signs of a severe allergic reaction, very high fever, or unusual behavior. Signs of a severe allergic reaction can include hives, swelling of the face and throat, difficulty breathing, a fast heartbeat, dizziness, and weakness. These would start a few minutes to a few hours after the vaccination. What should I do?  If you think it is a severe allergic reaction or other emergency that can't wait, call 9-1-1 and get the person to the nearest hospital. Otherwise, call your doctor.  Reactions should be reported to the Vaccine Adverse Event Reporting System (VAERS). Your doctor should file this report, or you can do it yourself through the VAERS web site at www.vaers.SamedayNews.es, or by calling 850-277-1596. VAERS does not give medical advice. 6. The National Vaccine Injury Compensation Program The [employer] Vaccine Injury Compensation Program (VICP) is a federal program that was created to compensate people who may have been injured by certain vaccines. Persons who believe they may have been injured by a vaccine can learn about the program and about filing a claim by calling 4126492020 or visiting the [place] website at GoldCloset.com.ee. There is a time limit to file a claim for compensation. 7. How can I learn more?  Ask your healthcare provider. He or she can give you the vaccine package insert or suggest other sources of information.  Call your local or state health department.  Contact the Centers for Disease Control and Prevention (CDC):  Call 873-533-4037 (1-800-CDC-INFO) or  Visit CDC's website at  https://gibson.com/ Vaccine Information Statement Inactivated Influenza Vaccine (11/24/2013)   This information is not intended to replace advice given to you by your health care provider. Make sure you discuss any questions you have with your health care provider.   Document Released: 01/29/2006 Document Revised: 04/27/2014 Document Reviewed: 11/27/2013 Elsevier Interactive Patient Education [employer].

## 2016-01-16 NOTE — Assessment & Plan Note (Signed)
Rechecking labs today. Await results. Treat as needed.  °

## 2016-01-17 ENCOUNTER — Encounter: Payer: Self-pay | Admitting: Family Medicine

## 2016-01-17 LAB — COMPREHENSIVE METABOLIC PANEL
A/G RATIO: 1.4 (ref 1.2–2.2)
ALBUMIN: 4.2 g/dL (ref 3.5–5.5)
ALT: 48 IU/L — ABNORMAL HIGH (ref 0–44)
AST: 20 IU/L (ref 0–40)
Alkaline Phosphatase: 71 IU/L (ref 39–117)
BILIRUBIN TOTAL: 0.5 mg/dL (ref 0.0–1.2)
BUN / CREAT RATIO: 21 — AB (ref 9–20)
BUN: 21 mg/dL (ref 6–24)
CALCIUM: 9.6 mg/dL (ref 8.7–10.2)
CHLORIDE: 99 mmol/L (ref 96–106)
CO2: 24 mmol/L (ref 18–29)
Creatinine, Ser: 1 mg/dL (ref 0.76–1.27)
GFR, EST AFRICAN AMERICAN: 104 mL/min/{1.73_m2} (ref >59)
GFR, EST NON AFRICAN AMERICAN: 90 mL/min/{1.73_m2} (ref >59)
GLUCOSE: 168 mg/dL — AB (ref 65–99)
Globulin, Total: 3 g/dL (ref 1.5–4.5)
Potassium: 5.3 mmol/L — ABNORMAL HIGH (ref 3.5–5.2)
Sodium: 137 mmol/L (ref 134–144)
TOTAL PROTEIN: 7.2 g/dL (ref 6.0–8.5)

## 2016-01-17 LAB — CBC WITH DIFFERENTIAL/PLATELET
Basophils Absolute: 0 10*3/uL (ref 0.0–0.2)
Basos: 0 %
EOS (ABSOLUTE): 0.4 10*3/uL (ref 0.0–0.4)
EOS: 6 %
HEMOGLOBIN: 15.7 g/dL (ref 12.6–17.7)
Hematocrit: 47.2 % (ref 37.5–51.0)
IMMATURE GRANS (ABS): 0 10*3/uL (ref 0.0–0.1)
IMMATURE GRANULOCYTES: 0 %
Lymphocytes Absolute: 2.6 10*3/uL (ref 0.7–3.1)
Lymphs: 34 %
MCH: 29.5 pg (ref 26.6–33.0)
MCHC: 33.3 g/dL (ref 31.5–35.7)
MCV: 89 fL (ref 79–97)
MONOS ABS: 0.8 10*3/uL (ref 0.1–0.9)
Monocytes: 10 %
NEUTROS PCT: 50 %
Neutrophils Absolute: 3.9 10*3/uL (ref 1.4–7.0)
Platelets: 270 10*3/uL (ref 150–379)
RBC: 5.33 x10E6/uL (ref 4.14–5.80)
RDW: 14.3 % (ref 12.3–15.4)
WBC: 7.8 10*3/uL (ref 3.4–10.8)

## 2016-01-17 LAB — TSH: TSH: 2.5 u[IU]/mL (ref 0.450–4.500)

## 2016-01-17 LAB — LIPID PANEL W/O CHOL/HDL RATIO
Cholesterol, Total: 203 mg/dL — ABNORMAL HIGH (ref 100–199)
HDL: 42 mg/dL (ref >39)
LDL Calculated: 113 mg/dL — ABNORMAL HIGH (ref 0–99)
Triglycerides: 242 mg/dL — ABNORMAL HIGH (ref 0–149)
VLDL Cholesterol Cal: 48 mg/dL — ABNORMAL HIGH (ref 5–40)

## 2016-01-20 LAB — HM DIABETES EYE EXAM

## 2016-04-13 ENCOUNTER — Other Ambulatory Visit: Payer: Self-pay | Admitting: Family Medicine

## 2016-04-15 NOTE — Telephone Encounter (Signed)
I'm seeing him tomorrow- does he need this today?

## 2016-04-15 NOTE — Telephone Encounter (Signed)
Left message letting patient know that this medication will be refilled at his appt scheduled for 04/16/16, and to call me with any questions or concerns.

## 2016-04-16 ENCOUNTER — Encounter: Payer: Self-pay | Admitting: Family Medicine

## 2016-04-16 ENCOUNTER — Ambulatory Visit (INDEPENDENT_AMBULATORY_CARE_PROVIDER_SITE_OTHER): Payer: BLUE CROSS/BLUE SHIELD | Admitting: Family Medicine

## 2016-04-16 VITALS — BP 138/84 | HR 66 | Temp 98.3°F | Wt 267.8 lb

## 2016-04-16 DIAGNOSIS — E119 Type 2 diabetes mellitus without complications: Secondary | ICD-10-CM | POA: Diagnosis not present

## 2016-04-16 DIAGNOSIS — F411 Generalized anxiety disorder: Secondary | ICD-10-CM | POA: Diagnosis not present

## 2016-04-16 LAB — BAYER DCA HB A1C WAIVED: HB A1C (BAYER DCA - WAIVED): 8.4 % — ABNORMAL HIGH (ref <7.0)

## 2016-04-16 LAB — HEMOGLOBIN A1C: HEMOGLOBIN A1C: 8.4

## 2016-04-16 MED ORDER — METFORMIN HCL ER 500 MG PO TB24: 500.0000 mg | ORAL_TABLET | Freq: Two times a day (BID) | ORAL | 1 refills | Status: DC

## 2016-04-16 MED ORDER — TRAZODONE HCL 100 MG PO TABS: 100.0000 mg | ORAL_TABLET | Freq: Every day | ORAL | 1 refills | Status: DC

## 2016-04-16 MED ORDER — ALPRAZOLAM 0.5 MG PO TABS: 0.5000 mg | ORAL_TABLET | Freq: Two times a day (BID) | ORAL | 2 refills | Status: DC | PRN

## 2016-04-16 MED ORDER — ESCITALOPRAM OXALATE 20 MG PO TABS: 20.0000 mg | ORAL_TABLET | Freq: Every day | ORAL | 1 refills | Status: DC

## 2016-04-16 NOTE — Assessment & Plan Note (Signed)
Stable. Under good control. Continue current regimen. Continue to monitor. Call with any concerns.

## 2016-04-16 NOTE — Patient Instructions (Addendum)
How to Avoid Diabetes Mellitus Problems You can take action to prevent or slow down problems that are caused by diabetes (diabetes mellitus). Following your diabetes plan and taking care of yourself can reduce your risk of serious or life-threatening complications. Manage your diabetes  Follow instructions from your health care providers about managing your diabetes. Your diabetes may be managed by a team of health care providers who can teach you how to care for yourself and can answer questions that you have.  Educate yourself about your condition so you can make healthy choices about eating and physical activity.  Check your blood sugar (glucose) levels as often as directed. Your health care provider will help you decide how often to check your blood glucose level depending on your treatment goals and how well you are meeting them.  Ask your health care provider if you should take low-dose aspirin daily and what dose is recommended for you. Taking low-dose aspirin daily is recommended to help prevent cardiovascular disease. Do not use nicotine or tobacco Do not use any products that contain nicotine or tobacco, such as cigarettes and e-cigarettes. If you need help quitting, ask your health care provider. Nicotine raises your risk for diabetes problems. If you quit using nicotine:  You will lower your risk for heart attack, stroke, nerve disease, and kidney disease.  Your cholesterol and blood pressure may improve.  Your blood circulation will improve. Keep your blood pressure under control To control your blood pressure:  Follow instructions from your health care provider about meal planning, exercise, and medicines.  Make sure your health care provider checks your blood pressure at every medical visit. A blood pressure reading consists of two numbers. Generally, the goal is to keep your top number (systolic pressure) at or below 140, and your bottom number (diastolic pressure) at or  below 90. Your health care provider may recommend a lower target blood pressure. Your individualized target blood pressure is determined based on:  Your age.  Your medicines.  How long you have had diabetes.  Any other medical conditions you have. Keep your cholesterol under control To control your cholesterol:  Follow instructions from your health care provider about meal planning, exercise, and medicines.  Have your cholesterol checked at least once a year.  You may be prescribed medicine to lower cholesterol (statin). If you are not taking a statin, ask your health care provider if you should be. Controlling your cholesterol may:  Help prevent heart disease and stroke. These are the most common health problems for people with diabetes.  Improve your blood flow. Schedule and keep yearly physical exams and eye exams Your health care provider will tell you how often you need medical visits depending on your diabetes management plan. Keep all follow-up visits as directed. This is important so possible problems can be identified early and complications can be avoided or treated.  Every visit with your health care provider should include measuring your:  Weight.  Blood pressure.  Blood glucose control.  Your A1c (hemoglobin A1c) level should be checked:  At least 2 times a year, if you are meeting your treatment goals.  4 times a year, if you are not meeting treatment goals or if your treatment goals have changed.  Your blood lipids (lipid profile) should be checked yearly. You should also be checked yearly for protein in your urine (urine microalbumin).  If you have type 1 diabetes, get an eye exam 3-5 years after you are diagnosed, and then once  a year after your first exam.  If you have type 2 diabetes, get an eye exam as soon as you are diagnosed, and then once a year after your first exam. Keep your vaccines current It is recommended that you receive:  A flu  (influenza) vaccine every year.  A pneumonia (pneumococcal) vaccine and a hepatitis B vaccine. If you are age 85 or older, you may get the pneumonia vaccine as a series of two separate shots. Ask your health care provider which other vaccines may be recommended. Take care of your feet Diabetes may cause you to have poor blood circulation to your legs and feet. Because of this, taking care of your feet is very important. Diabetes can cause:  The skin on the feet to get thinner, break more easily, and heal more slowly.  Nerve damage in your legs and feet, which results in decreased feeling. You may not notice minor injuries that could lead to serious problems. To avoid foot problems:  Check your skin and feet every day for cuts, bruises, redness, blisters, or sores.  Schedule a foot exam with your health care provider once every year. This exam includes:  Inspecting of the structure and skin of your feet.  Checking the pulses and sensation in your feet.  Make sure that your health care provider performs a visual foot exam at every medical visit. Take care of your teeth People with poorly controlled diabetes are more likely to have gum (periodontal) disease. Diabetes can make periodontal diseases harder to control. If not treated, periodontal diseases can lead to tooth loss. To prevent this:  Brush your teeth twice a day.  Floss at least once a day.  Visit your dentist 2 times a year. Drink responsibly Limit alcohol intake to no more than 1 drink a day for nonpregnant women and 2 drinks a day for men. One drink equals 12 oz of beer, 5 oz of wine, or 1 oz of hard liquor. It is important to eat food when you drink alcohol to avoid low blood glucose (hypoglycemia). Avoid alcohol if you:  Have a history of alcohol abuse or dependence.  Are pregnant.  Have liver disease, pancreatitis, advanced neuropathy, or severe hypertriglyceridemia. Lessen stress Living with diabetes can be  stressful. When you are experiencing stress, your blood glucose may be affected in two ways:  Stress hormones may cause your blood glucose to rise.  You may be distracted from taking good care of yourself. Be aware of your stress level and make changes to help you manage challenging situations. To lower your stress levels:  Consider joining a support group.  Do planned relaxation or meditation.  Do a hobby that you enjoy.  Maintain healthy relationships.  Exercise regularly.  Work with your health care provider or a mental health professional. Summary  You can take action to prevent or slow down problems that are caused by diabetes (diabetes mellitus). Following your diabetes plan and taking care of yourself can reduce your risk of serious or life-threatening complications.  Follow instructions from your health care providers about managing your diabetes. Your diabetes may be managed by a team of health care providers who can teach you how to care for yourself and can answer questions that you have.  Your health care provider will tell you how often you need medical visits depending on your diabetes management plan. Keep all follow-up visits as directed. This is important so possible problems can be identified early and complications can be avoided or  treated. This information is not intended to replace advice given to you by your health care provider. Make sure you discuss any questions you have with your health care provider. Document Released: 12/23/2010 Document Revised: 01/04/2016 Document Reviewed: 01/04/2016 Elsevier Interactive Patient Education  2017 [employer].

## 2016-04-16 NOTE — Assessment & Plan Note (Signed)
A1c going in wrong direction. Up to 8.4 will increase metformin to BID and recheck 3 months. Watch diet and exercise.

## 2016-04-16 NOTE — Progress Notes (Signed)
BP 138/84   Pulse 66   Temp 98.3 F (36.8 C)   Wt 267 lb 12.8 oz (121.5 kg)   SpO2 94%   BMI 37.88 kg/m    Subjective:    Patient ID: Glenn Flynn, male    DOB: 1970/10/29, 45 y.o.   MRN: [id number]  HPI: Glenn Flynn is a 45 y.o. male  Chief Complaint  Patient presents with  . Diabetes  . Anxiety   DIABETES Hypoglycemic episodes:no Polydipsia/polyuria: no Visual disturbance: yes Chest pain: no Paresthesias: no Glucose Monitoring: no Taking Insulin?: no Blood Pressure Monitoring: not checking Retinal Examination: Not up to Date Foot Exam: Up to Date Diabetic Education: Completed Pneumovax: Up to Date Influenza: Up to Date Aspirin: no  ANXIETY/STRESS Duration:stable Anxious mood: yes  Excessive worrying: yes Irritability: no  Sweating: no Nausea: no Palpitations:no Hyperventilation: no Panic attacks: no Agoraphobia: no  Obscessions/compulsions: no Depressed mood: no Depression screen [facility] 2/9 01/16/2016  Decreased Interest 0  Down, Depressed, Hopeless 0  PHQ - 2 Score 0   GAD 7 : Generalized Anxiety Score 04/16/2016 01/16/2016 10/08/2015 04/18/2015  Nervous, Anxious, on Edge 3 3 3 3   Control/stop worrying 1 1 2 1   Worry too much - different things 1 1 2 1   Trouble relaxing 2 1 0 0  Restless 0 0 0 0  Easily annoyed or irritable 0 0 0 0  Afraid - awful might happen 0 0 0 0  Total GAD 7 Score 7 6 7 5   Anxiety Difficulty Somewhat difficult Somewhat difficult Not difficult at all Somewhat difficult   Anhedonia: no Weight changes: no Insomnia: no   Hypersomnia: no Fatigue/loss of energy: yes Feelings of worthlessness: no Feelings of guilt: no Impaired concentration/indecisiveness: no Suicidal ideations: no  Crying spells: no Recent Stressors/Life Changes: no  Relevant past medical, surgical, family and social history reviewed and updated as indicated. Interim medical history since our last visit reviewed. Allergies and medications  reviewed and updated.  Review of Systems  Constitutional: Negative.   Respiratory: Negative.   Cardiovascular: Negative.   Psychiatric/Behavioral: Negative for agitation, behavioral problems, confusion, decreased concentration, dysphoric mood, hallucinations, self-injury, sleep disturbance and suicidal ideas. The patient is nervous/anxious. The patient is not hyperactive.     Per HPI unless specifically indicated above     Objective:    BP 138/84   Pulse 66   Temp 98.3 F (36.8 C)   Wt 267 lb 12.8 oz (121.5 kg)   SpO2 94%   BMI 37.88 kg/m   Wt Readings from Last 3 Encounters:  04/16/16 267 lb 12.8 oz (121.5 kg)  01/16/16 264 lb (119.7 kg)  10/08/15 268 lb (121.6 kg)    Physical Exam  Constitutional: He is oriented to person, place, and time. He appears well-developed and well-nourished. No distress.  HENT:  Head: Normocephalic and atraumatic.  Right Ear: Hearing normal.  Left Ear: Hearing normal.  Nose: Nose normal.  Eyes: Conjunctivae and lids are normal. Right eye exhibits no discharge. Left eye exhibits no discharge. No scleral icterus.  Cardiovascular: Normal rate, regular rhythm, normal heart sounds and intact distal pulses.  Exam reveals no gallop and no friction rub.   No murmur heard. Pulmonary/Chest: Effort normal and breath sounds normal. No respiratory distress. He has no wheezes. He has no rales. He exhibits no tenderness.  Musculoskeletal: Normal range of motion.  Neurological: He is alert and oriented to person, place, and time.  Skin: Skin is warm, dry and intact.  No rash noted. He is not diaphoretic. No erythema. No pallor.  Psychiatric: He has a normal mood and affect. His speech is normal and behavior is normal. Judgment and thought content normal. Cognition and memory are normal.  Nursing note and vitals reviewed.   Results for orders placed or performed in visit on 04/16/16  Hemoglobin A1c  Result Value Ref Range   Hemoglobin A1C 8.4         Assessment & Plan:   Problem List Items Addressed This Visit      Endocrine   Type 2 diabetes mellitus ([place]) - Primary    A1c going in wrong direction. Up to 8.4 will increase metformin to BID and recheck 3 months. Watch diet and exercise.       Relevant Medications   metFORMIN (GLUCOPHAGE-XR) 500 MG 24 hr tablet   Other Relevant Orders   Bayer DCA Hb A1c Waived     Other   Anxiety disorder    Stable. Under good control. Continue current regimen. Continue to monitor. Call with any concerns.           Follow up plan: Return in about 3 months (around 07/15/2016) for DM/Chol/Anxiety follow up.

## 2016-05-12 ENCOUNTER — Other Ambulatory Visit: Payer: Self-pay | Admitting: Family Medicine

## 2016-05-15 ENCOUNTER — Telehealth: Payer: Self-pay | Admitting: Family Medicine

## 2016-05-15 NOTE — Telephone Encounter (Signed)
Dieu-ha called from [place] to let Dr Wynetta Emery know that the script for this patients Metformin from 12/28 they filled incorrectly but the error was found and they have corrected it and wanted to let Dr Wynetta Emery know so she would be aware of this situation.  Santiago Glad

## 2016-05-15 NOTE — Telephone Encounter (Signed)
Forward to provider

## 2016-07-15 ENCOUNTER — Encounter: Payer: Self-pay | Admitting: Family Medicine

## 2016-07-15 ENCOUNTER — Ambulatory Visit (INDEPENDENT_AMBULATORY_CARE_PROVIDER_SITE_OTHER): Payer: BLUE CROSS/BLUE SHIELD | Admitting: Family Medicine

## 2016-07-15 VITALS — BP 129/80 | HR 70 | Temp 98.3°F | Resp 17 | Ht 70.5 in | Wt 266.0 lb

## 2016-07-15 DIAGNOSIS — E119 Type 2 diabetes mellitus without complications: Secondary | ICD-10-CM | POA: Diagnosis not present

## 2016-07-15 DIAGNOSIS — E782 Mixed hyperlipidemia: Secondary | ICD-10-CM

## 2016-07-15 DIAGNOSIS — F411 Generalized anxiety disorder: Secondary | ICD-10-CM | POA: Diagnosis not present

## 2016-07-15 DIAGNOSIS — R002 Palpitations: Secondary | ICD-10-CM

## 2016-07-15 LAB — BAYER DCA HB A1C WAIVED: HB A1C (BAYER DCA - WAIVED): 8.1 % — ABNORMAL HIGH (ref <7.0)

## 2016-07-15 MED ORDER — ALPRAZOLAM 0.5 MG PO TABS: 0.5000 mg | ORAL_TABLET | Freq: Two times a day (BID) | ORAL | 2 refills | Status: DC | PRN

## 2016-07-15 MED ORDER — TRAZODONE HCL 100 MG PO TABS: 100.0000 mg | ORAL_TABLET | Freq: Every day | ORAL | 1 refills | Status: DC

## 2016-07-15 MED ORDER — METFORMIN HCL ER 500 MG PO TB24: 1000.0000 mg | ORAL_TABLET | Freq: Two times a day (BID) | ORAL | 1 refills | Status: DC

## 2016-07-15 MED ORDER — ESCITALOPRAM OXALATE 20 MG PO TABS: 20.0000 mg | ORAL_TABLET | Freq: Every day | ORAL | 1 refills | Status: DC

## 2016-07-15 NOTE — Assessment & Plan Note (Signed)
A1c down to 8.1- but still elevated. Will increase metformin to 1000mg  BID and recheck in 3 months.

## 2016-07-15 NOTE — Patient Instructions (Addendum)
Palpitations A palpitation is the feeling that your heartbeat is irregular or is faster than normal. It may feel like your heart is fluttering or skipping a beat. Palpitations are usually not a serious problem. They may be caused by many things, including smoking, caffeine, alcohol, stress, and certain medicines. Although most causes of palpitations are not serious, palpitations can be a sign of a serious medical problem. In some cases, you may need further medical evaluation. Follow these instructions at home: Pay attention to any changes in your symptoms. Take these actions to help with your condition:  Avoid the following: ? Caffeinated coffee, tea, soft drinks, diet pills, and energy drinks. ? Chocolate. ? Alcohol.  Do not use any tobacco products, such as cigarettes, chewing tobacco, and e-cigarettes. If you need help quitting, ask your health care provider.  Try to reduce your stress and anxiety. Things that can help you relax include: ? Yoga. ? Meditation. ? Physical activity, such as swimming, jogging, or walking. ? Biofeedback. This is a method that helps you learn to use your mind to control things in your body, such as your heartbeats.  Get plenty of rest and sleep.  Take over-the-counter and prescription medicines only as told by your health care provider.  Keep all follow-up visits as told by your health care provider. This is important.  Contact a health care provider if:  You continue to have a fast or irregular heartbeat after 24 hours.  Your palpitations occur more often. Get help right away if:  You have chest pain or shortness of breath.  You have a severe headache.  You feel dizzy or you faint. This information is not intended to replace advice given to you by your health care provider. Make sure you discuss any questions you have with your health care provider. Document Released: 04/03/2000 Document Revised: 09/09/2015 Document Reviewed: 12/20/2014 Elsevier  Interactive Patient Education  2017 [employer] Inc.  

## 2016-07-15 NOTE — Progress Notes (Signed)
Pt scheduled for Friday 8 a.m. @ [facility] @ [address] [place] [address] [facility]: [phone]) for 24 hour Holter monitor.

## 2016-07-15 NOTE — Assessment & Plan Note (Signed)
Rechecking levels today. Await results and treat as needed.  

## 2016-07-15 NOTE — Progress Notes (Signed)
BP 129/80 (BP Location: Left Arm, Patient Position: Sitting, Cuff Size: Large)   Pulse 70   Temp 98.3 F (36.8 C) (Oral)   Resp 17   Ht 5' 10.5" (1.791 m)   Wt 266 lb (120.7 kg)   SpO2 97%   BMI 37.63 kg/m    Subjective:    Patient ID: Glenn Flynn, male    DOB: 02/17/1971, 46 y.o.   MRN: [id number]  HPI: Glenn Flynn is a 46 y.o. male  Chief Complaint  Patient presents with  . Diabetes  . Hyperlipidemia  . Anxiety   PALPITATIONS Duration: Couple of months Symptom description: pounding or pulsing in his chest going into his neck Duration of episode: few minutes to 1/2 hour usually- yesterday made it last 1-2 hours Frequency: recurrent Activity when event occurred: at random times, occasionally after eating,  Related to exertion: no Dyspnea: yes Chest pain: no Syncope: no Anxiety/stress: yes Nausea/vomiting: no Diaphoresis: no Coronary artery disease: no Congestive heart failure: no Arrhythmia:no Thyroid disease: no Caffeine intake: 1-2 cups of coffee a day, 1/2 cup of tea at lunch and dinner Status:  worse Treatments attempted:none  DIABETES Hypoglycemic episodes:no Polydipsia/polyuria: no Visual disturbance: no Chest pain: no Paresthesias: no Glucose Monitoring: no Taking Insulin?: no Blood Pressure Monitoring: not checking Retinal Examination: Up to Date Foot Exam: Up to Date Diabetic Education: Completed Pneumovax: Up to Date Influenza: Up to Date Aspirin: yes  HYPERLIPIDEMIA Hyperlipidemia status: Stable Satisfied with current treatment?  yes Side effects:  Not on anything Past cholesterol meds: none Supplements: none Chest pain:  no Coronary artery disease:  no Family history CAD:  yes  ANXIETY/STRESS- hasn't been taking his xanax because he hasn't needed it Duration:better Anxious mood: yes  Excessive worrying: no Irritability: no  Sweating: no Nausea: yes Palpitations:yes Hyperventilation: no Panic attacks:  no Agoraphobia: no  Obscessions/compulsions: no Depressed mood: no Depression screen PHQ 2/9 01/16/2016  Decreased Interest 0  Down, Depressed, Hopeless 0  PHQ - 2 Score 0   Anhedonia: no Weight changes: no Insomnia: no   Hypersomnia: no Fatigue/loss of energy: yes Feelings of worthlessness: no Feelings of guilt: no Impaired concentration/indecisiveness: no Suicidal ideations: no  Crying spells: no Recent Stressors/Life Changes: no   Relevant past medical, surgical, family and social history reviewed and updated as indicated. Interim medical history since our last visit reviewed. Allergies and medications reviewed and updated.  Review of Systems  Constitutional: Negative.   Respiratory: Negative.   Cardiovascular: Positive for palpitations. Negative for chest pain and leg swelling.  Gastrointestinal: Positive for diarrhea and nausea. Negative for abdominal distention, abdominal pain, anal bleeding, blood in stool, constipation, rectal pain and vomiting.  Neurological: Positive for dizziness and light-headedness.  Psychiatric/Behavioral: Negative for agitation, behavioral problems, confusion, decreased concentration, dysphoric mood, hallucinations, self-injury, sleep disturbance and suicidal ideas. The patient is nervous/anxious. The patient is not hyperactive.     Per HPI unless specifically indicated above     Objective:    BP 129/80 (BP Location: Left Arm, Patient Position: Sitting, Cuff Size: Large)   Pulse 70   Temp 98.3 F (36.8 C) (Oral)   Resp 17   Ht 5' 10.5" (1.791 m)   Wt 266 lb (120.7 kg)   SpO2 97%   BMI 37.63 kg/m   Wt Readings from Last 3 Encounters:  07/15/16 266 lb (120.7 kg)  04/16/16 267 lb 12.8 oz (121.5 kg)  01/16/16 264 lb (119.7 kg)    Physical Exam  Constitutional:  He is oriented to person, place, and time. He appears well-developed and well-nourished. No distress.  HENT:  Head: Normocephalic and atraumatic.  Right Ear: Hearing normal.   Left Ear: Hearing normal.  Nose: Nose normal.  Eyes: Conjunctivae and lids are normal. Right eye exhibits no discharge. Left eye exhibits no discharge. No scleral icterus.  Cardiovascular: Normal rate, regular rhythm, normal heart sounds and intact distal pulses.  Exam reveals no gallop and no friction rub.   No murmur heard. Pulmonary/Chest: Effort normal and breath sounds normal. No respiratory distress. He has no wheezes. He has no rales. He exhibits no tenderness.  Musculoskeletal: Normal range of motion.  Neurological: He is alert and oriented to person, place, and time.  Skin: Skin is warm, dry and intact. No rash noted. No erythema. No pallor.  Psychiatric: He has a normal mood and affect. His speech is normal and behavior is normal. Judgment and thought content normal. Cognition and memory are normal.  Nursing note and vitals reviewed.   Results for orders placed or performed in visit on 07/15/16  Microalbumin, urine  Result Value Ref Range   Microalb, Ur 80      EKG reviewed- NSR mildly bradycardic. Normal.   Assessment & Plan:   Problem List Items Addressed This Visit      Endocrine   Type 2 diabetes mellitus ([place])    A1c down to 8.1- but still elevated. Will increase metformin to 1000mg  BID and recheck in 3 months.       Relevant Medications   aspirin EC 81 MG tablet   metFORMIN (GLUCOPHAGE-XR) 500 MG 24 hr tablet   Other Relevant Orders   Bayer DCA Hb A1c Waived   Comprehensive metabolic panel     Other   Hyperlipidemia    Rechecking levels today. Await results and treat as needed.       Relevant Medications   aspirin EC 81 MG tablet   Other Relevant Orders   Lipid Panel w/o Chol/HDL Ratio   Comprehensive metabolic panel   Anxiety disorder    Improved- he is feeling better, but now with palpitations. Continue current regimen, keep journal of palpitations. Refill given. Recheck 3 months.       Relevant Orders   Thyroid Panel With TSH    Other Visit  Diagnoses    Heart palpitations    -  Primary   EKG normal. ?PVCs with anxiety. Will keep journal and arrange for holter monitor. Call with any concerns.    Relevant Orders   Bayer DCA Hb A1c Waived   Comprehensive metabolic panel   Thyroid Panel With TSH   EKG 12-Lead (Completed)   Holter monitor - 24 hour       Follow up plan: Return in about 3 months (around 10/15/2016) for DM and anxiety follow up.

## 2016-07-15 NOTE — Assessment & Plan Note (Signed)
Improved- he is feeling better, but now with palpitations. Continue current regimen, keep journal of palpitations. Refill given. Recheck 3 months.

## 2016-07-16 ENCOUNTER — Encounter: Payer: Self-pay | Admitting: Family Medicine

## 2016-07-16 LAB — THYROID PANEL WITH TSH
Free Thyroxine Index: 1.8 (ref 1.2–4.9)
T3 Uptake Ratio: 24 % (ref 24–39)
T4, Total: 7.3 ug/dL (ref 4.5–12.0)
TSH: 2.13 u[IU]/mL (ref 0.450–4.500)

## 2016-07-16 LAB — COMPREHENSIVE METABOLIC PANEL
A/G RATIO: 1.5 (ref 1.2–2.2)
ALBUMIN: 4.3 g/dL (ref 3.5–5.5)
ALT: 45 IU/L — ABNORMAL HIGH (ref 0–44)
AST: 22 IU/L (ref 0–40)
Alkaline Phosphatase: 67 IU/L (ref 39–117)
BUN/Creatinine Ratio: 17 (ref 9–20)
BUN: 17 mg/dL (ref 6–24)
Bilirubin Total: 0.5 mg/dL (ref 0.0–1.2)
CALCIUM: 9.8 mg/dL (ref 8.7–10.2)
CO2: 22 mmol/L (ref 18–29)
Chloride: 99 mmol/L (ref 96–106)
Creatinine, Ser: 1.02 mg/dL (ref 0.76–1.27)
GFR, EST AFRICAN AMERICAN: 101 mL/min/{1.73_m2} (ref >59)
GFR, EST NON AFRICAN AMERICAN: 87 mL/min/{1.73_m2} (ref >59)
GLOBULIN, TOTAL: 2.8 g/dL (ref 1.5–4.5)
Glucose: 173 mg/dL — ABNORMAL HIGH (ref 65–99)
POTASSIUM: 4.8 mmol/L (ref 3.5–5.2)
SODIUM: 140 mmol/L (ref 134–144)
TOTAL PROTEIN: 7.1 g/dL (ref 6.0–8.5)

## 2016-07-16 LAB — LIPID PANEL W/O CHOL/HDL RATIO
Cholesterol, Total: 192 mg/dL (ref 100–199)
HDL: 42 mg/dL (ref >39)
LDL CALC: 108 mg/dL — AB (ref 0–99)
Triglycerides: 208 mg/dL — ABNORMAL HIGH (ref 0–149)
VLDL Cholesterol Cal: 42 mg/dL — ABNORMAL HIGH (ref 5–40)

## 2016-07-17 ENCOUNTER — Ambulatory Visit (INDEPENDENT_AMBULATORY_CARE_PROVIDER_SITE_OTHER): Payer: BLUE CROSS/BLUE SHIELD

## 2016-07-17 DIAGNOSIS — R002 Palpitations: Secondary | ICD-10-CM | POA: Diagnosis not present

## 2016-07-24 ENCOUNTER — Ambulatory Visit
Admission: RE | Admit: 2016-07-24 | Discharge: 2016-07-24 | Disposition: A | Discharge status: Home / Self Care | Payer: BLUE CROSS/BLUE SHIELD | Source: Ambulatory Visit | Attending: Family Medicine | Admitting: Family Medicine

## 2016-07-24 DIAGNOSIS — R002 Palpitations: Secondary | ICD-10-CM | POA: Insufficient documentation

## 2016-08-25 ENCOUNTER — Encounter: Payer: Self-pay | Admitting: Family Medicine

## 2016-08-25 MED ORDER — IBUPROFEN 800 MG PO TABS: 800.0000 mg | ORAL_TABLET | Freq: Three times a day (TID) | ORAL | 0 refills | Status: DC | PRN

## 2016-08-25 MED ORDER — CYCLOBENZAPRINE HCL 10 MG PO TABS: 10.0000 mg | ORAL_TABLET | Freq: Every day | ORAL | 0 refills | Status: DC

## 2016-08-25 NOTE — Telephone Encounter (Signed)
Routing to provider  

## 2016-10-15 ENCOUNTER — Ambulatory Visit: Payer: BLUE CROSS/BLUE SHIELD | Admitting: Family Medicine

## 2016-10-26 ENCOUNTER — Encounter: Payer: Self-pay | Admitting: Family Medicine

## 2016-10-26 ENCOUNTER — Ambulatory Visit (INDEPENDENT_AMBULATORY_CARE_PROVIDER_SITE_OTHER): Payer: BLUE CROSS/BLUE SHIELD | Admitting: Family Medicine

## 2016-10-26 VITALS — BP 122/82 | HR 58 | Temp 98.4°F | Wt 262.6 lb

## 2016-10-26 DIAGNOSIS — E782 Mixed hyperlipidemia: Secondary | ICD-10-CM

## 2016-10-26 DIAGNOSIS — F411 Generalized anxiety disorder: Secondary | ICD-10-CM | POA: Diagnosis not present

## 2016-10-26 DIAGNOSIS — E119 Type 2 diabetes mellitus without complications: Secondary | ICD-10-CM | POA: Diagnosis not present

## 2016-10-26 MED ORDER — ALPRAZOLAM 0.5 MG PO TABS: 0.5000 mg | ORAL_TABLET | Freq: Two times a day (BID) | ORAL | 2 refills | Status: DC | PRN

## 2016-10-26 NOTE — Progress Notes (Signed)
BP 122/82   Pulse (!) 58   Temp 98.4 F (36.9 C)   Wt 262 lb 9 oz (119.1 kg)   SpO2 97%   BMI 37.14 kg/m    Subjective:    Patient ID: Glenn Flynn, male    DOB: 27-Apr-1970, 46 y.o.   MRN: [id number]  HPI: Glenn Flynn is a 47 y.o. male  Chief Complaint  Patient presents with  . Diabetes  . Anxiety   DIABETES Hypoglycemic episodes:no Polydipsia/polyuria: no Visual disturbance: no Chest pain: no Paresthesias: no Glucose Monitoring: no Taking Insulin?: no Blood Pressure Monitoring: not checking Retinal Examination: Up to Date Foot Exam: Up to Date Diabetic Education: Completed Pneumovax: Up to Date Influenza: Up to Date Aspirin: yes  ANXIETY/STRESS Duration:exacerbated Anxious mood: yes  Excessive worrying: yes Irritability: no  Sweating: no Nausea: no Palpitations:no Hyperventilation: no Panic attacks: no Agoraphobia: no  Obscessions/compulsions: no Depressed mood: no Depression screen [facility] 2/9 10/26/2016 01/16/2016  Decreased Interest 0 0  Down, Depressed, Hopeless 1 0  PHQ - 2 Score 1 0  Altered sleeping 1 -  Tired, decreased energy 1 -  Change in appetite 1 -  Feeling bad or failure about yourself  1 -  Trouble concentrating 0 -  Moving slowly or fidgety/restless 0 -  Suicidal thoughts 0 -  PHQ-9 Score 5 -   GAD 7 : Generalized Anxiety Score 10/26/2016 04/16/2016 01/16/2016 10/08/2015  Nervous, Anxious, on Edge 2 3 3 3   Control/stop worrying 1 1 1 2   Worry too much - different things 1 1 1 2   Trouble relaxing 0 2 1 0  Restless 0 0 0 0  Easily annoyed or irritable 0 0 0 0  Afraid - awful might happen 0 0 0 0  Total GAD 7 Score 4 7 6 7   Anxiety Difficulty Not difficult at all Somewhat difficult Somewhat difficult Not difficult at all   Anhedonia: no Weight changes: no Insomnia: no   Hypersomnia: no Fatigue/loss of energy: no Feelings of worthlessness: no Feelings of guilt: no Impaired concentration/indecisiveness: no Suicidal  ideations: no  Crying spells: no Recent Stressors/Life Changes: yes   Relationship problems: no   Family stress: no     Financial stress: no    Job stress: yes    Recent death/loss: no   Relevant past medical, surgical, family and social history reviewed and updated as indicated. Interim medical history since our last visit reviewed. Allergies and medications reviewed and updated.  Review of Systems  Constitutional: Negative.   Respiratory: Negative.   Cardiovascular: Negative.   Gastrointestinal: Positive for abdominal distention (after he ate for a couple of weeks, feels like more of a tightness, started after riding bikes). Negative for abdominal pain, anal bleeding, blood in stool, constipation, diarrhea, nausea, rectal pain and vomiting.  Psychiatric/Behavioral: Negative.     Per HPI unless specifically indicated above     Objective:    BP 122/82   Pulse (!) 58   Temp 98.4 F (36.9 C)   Wt 262 lb 9 oz (119.1 kg)   SpO2 97%   BMI 37.14 kg/m   Wt Readings from Last 3 Encounters:  10/26/16 262 lb 9 oz (119.1 kg)  07/15/16 266 lb (120.7 kg)  04/16/16 267 lb 12.8 oz (121.5 kg)    Physical Exam  Constitutional: He is oriented to person, place, and time. He appears well-developed and well-nourished. No distress.  HENT:  Head: Normocephalic and atraumatic.  Right Ear: Hearing normal.  Left Ear: Hearing normal.  Nose: Nose normal.  Eyes: Conjunctivae and lids are normal. Right eye exhibits no discharge. Left eye exhibits no discharge. No scleral icterus.  Cardiovascular: Normal rate, regular rhythm, normal heart sounds and intact distal pulses.  Exam reveals no gallop and no friction rub.   No murmur heard. Pulmonary/Chest: Effort normal and breath sounds normal. No respiratory distress. He has no wheezes. He has no rales. He exhibits no tenderness.  Abdominal: Soft. Bowel sounds are normal. He exhibits no distension and no mass. There is no tenderness. There is no  rebound and no guarding.  Musculoskeletal: Normal range of motion.  Neurological: He is alert and oriented to person, place, and time.  Skin: Skin is warm, dry and intact. No rash noted. He is not diaphoretic. No erythema. No pallor.  Psychiatric: He has a normal mood and affect. His speech is normal and behavior is normal. Judgment and thought content normal. Cognition and memory are normal.  Nursing note and vitals reviewed.   Results for orders placed or performed in visit on 07/15/16  Bayer DCA Hb A1c Waived  Result Value Ref Range   Bayer DCA Hb A1c Waived 8.1 (H) <7.0 %  Thyroid Panel With TSH  Result Value Ref Range   TSH 2.130 0.450 - 4.500 uIU/mL   T4, Total 7.3 4.5 - 12.0 ug/dL   T3 Uptake Ratio 24 24 - 39 %   Free Thyroxine Index 1.8 1.2 - 4.9  Microalbumin, urine  Result Value Ref Range   Microalb, Ur 80   Comprehensive metabolic panel  Result Value Ref Range   Glucose 173 (H) 65 - 99 mg/dL   BUN 17 6 - 24 mg/dL   Creatinine, Ser 1.02 0.76 - 1.27 mg/dL   GFR calc non Af Amer 87 >59 mL/min/1.73   GFR calc Af Amer 101 >59 mL/min/1.73   BUN/Creatinine Ratio 17 9 - 20   Sodium 140 134 - 144 mmol/L   Potassium 4.8 3.5 - 5.2 mmol/L   Chloride 99 96 - 106 mmol/L   CO2 22 18 - 29 mmol/L   Calcium 9.8 8.7 - 10.2 mg/dL   Total Protein 7.1 6.0 - 8.5 g/dL   Albumin 4.3 3.5 - 5.5 g/dL   Globulin, Total 2.8 1.5 - 4.5 g/dL   Albumin/Globulin Ratio 1.5 1.2 - 2.2   Bilirubin Total 0.5 0.0 - 1.2 mg/dL   Alkaline Phosphatase 67 39 - 117 IU/L   AST 22 0 - 40 IU/L   ALT 45 (H) 0 - 44 IU/L  Lipid Panel w/o Chol/HDL Ratio  Result Value Ref Range   Cholesterol, Total 192 100 - 199 mg/dL   Triglycerides 208 (H) 0 - 149 mg/dL   HDL 42 >39 mg/dL   VLDL Cholesterol Cal 42 (H) 5 - 40 mg/dL   LDL Calculated 108 (H) 0 - 99 mg/dL      Assessment & Plan:   Problem List Items Addressed This Visit      Endocrine   Type 2 diabetes mellitus ([place]) - Primary    Improved with A1c of 7.1!  Continue current regimen. Recheck 3 months.       Relevant Orders   Bayer DCA Hb A1c Waived   Comprehensive metabolic panel     Other   Hyperlipidemia    Rechecking levels today. Await results. Call with any concerns.       Relevant Orders   Comprehensive metabolic panel   Lipid Panel w/o Chol/HDL Ratio  Anxiety disorder    Under good control. Taking xanax rarely. Refill given today. Call with any concerns. Continue current regimen.           Follow up plan: Return in about 3 months (around 01/26/2017) for Physical and DM visit.

## 2016-10-26 NOTE — Assessment & Plan Note (Signed)
Rechecking levels today. Await results. Call with any concerns.  

## 2016-10-26 NOTE — Assessment & Plan Note (Signed)
Improved with A1c of 7.1! Continue current regimen. Recheck 3 months.

## 2016-10-26 NOTE — Assessment & Plan Note (Signed)
Under good control. Taking xanax rarely. Refill given today. Call with any concerns. Continue current regimen.

## 2016-10-27 LAB — COMPREHENSIVE METABOLIC PANEL
A/G RATIO: 1.6 (ref 1.2–2.2)
ALBUMIN: 4.4 g/dL (ref 3.5–5.5)
ALT: 53 IU/L — AB (ref 0–44)
AST: 29 IU/L (ref 0–40)
Alkaline Phosphatase: 70 IU/L (ref 39–117)
BUN / CREAT RATIO: 19 (ref 9–20)
BUN: 17 mg/dL (ref 6–24)
Bilirubin Total: 0.4 mg/dL (ref 0.0–1.2)
CALCIUM: 10.1 mg/dL (ref 8.7–10.2)
CO2: 23 mmol/L (ref 20–29)
Chloride: 97 mmol/L (ref 96–106)
Creatinine, Ser: 0.91 mg/dL (ref 0.76–1.27)
GFR calc non Af Amer: 100 mL/min/{1.73_m2} (ref >59)
GFR, EST AFRICAN AMERICAN: 116 mL/min/{1.73_m2} (ref >59)
Globulin, Total: 2.8 g/dL (ref 1.5–4.5)
Glucose: 149 mg/dL — ABNORMAL HIGH (ref 65–99)
POTASSIUM: 4.9 mmol/L (ref 3.5–5.2)
Sodium: 136 mmol/L (ref 134–144)
TOTAL PROTEIN: 7.2 g/dL (ref 6.0–8.5)

## 2016-10-27 LAB — LIPID PANEL W/O CHOL/HDL RATIO
Cholesterol, Total: 190 mg/dL (ref 100–199)
HDL: 41 mg/dL (ref >39)
LDL Calculated: 88 mg/dL (ref 0–99)
Triglycerides: 306 mg/dL — ABNORMAL HIGH (ref 0–149)
VLDL Cholesterol Cal: 61 mg/dL — ABNORMAL HIGH (ref 5–40)

## 2016-10-27 LAB — BAYER DCA HB A1C WAIVED: HB A1C: 7.1 % — AB (ref <7.0)

## 2017-01-20 ENCOUNTER — Ambulatory Visit: Payer: BLUE CROSS/BLUE SHIELD | Admitting: Family Medicine

## 2017-01-22 ENCOUNTER — Ambulatory Visit (INDEPENDENT_AMBULATORY_CARE_PROVIDER_SITE_OTHER): Payer: BLUE CROSS/BLUE SHIELD | Admitting: Family Medicine

## 2017-01-22 ENCOUNTER — Encounter: Payer: Self-pay | Admitting: Family Medicine

## 2017-01-22 VITALS — BP 128/85 | HR 76 | Temp 98.7°F | Ht 70.7 in | Wt 259.4 lb

## 2017-01-22 DIAGNOSIS — E119 Type 2 diabetes mellitus without complications: Secondary | ICD-10-CM

## 2017-01-22 DIAGNOSIS — R3121 Asymptomatic microscopic hematuria: Secondary | ICD-10-CM

## 2017-01-22 DIAGNOSIS — Z23 Encounter for immunization: Secondary | ICD-10-CM

## 2017-01-22 DIAGNOSIS — F411 Generalized anxiety disorder: Secondary | ICD-10-CM

## 2017-01-22 DIAGNOSIS — G4700 Insomnia, unspecified: Secondary | ICD-10-CM | POA: Diagnosis not present

## 2017-01-22 DIAGNOSIS — E782 Mixed hyperlipidemia: Secondary | ICD-10-CM | POA: Diagnosis not present

## 2017-01-22 DIAGNOSIS — Z0000 Encounter for general adult medical examination without abnormal findings: Secondary | ICD-10-CM | POA: Diagnosis not present

## 2017-01-22 DIAGNOSIS — R0981 Nasal congestion: Secondary | ICD-10-CM

## 2017-01-22 LAB — BAYER DCA HB A1C WAIVED: HB A1C: 7.8 % — AB (ref <7.0)

## 2017-01-22 LAB — MICROSCOPIC EXAMINATION: BACTERIA UA: NONE SEEN

## 2017-01-22 LAB — UA/M W/RFLX CULTURE, ROUTINE
Bilirubin, UA: NEGATIVE
GLUCOSE, UA: NEGATIVE
LEUKOCYTES UA: NEGATIVE
NITRITE UA: NEGATIVE
PROTEIN UA: NEGATIVE
Urobilinogen, Ur: 0.2 mg/dL (ref 0.2–1.0)
pH, UA: 5 (ref 5.0–7.5)

## 2017-01-22 LAB — MICROALBUMIN, URINE WAIVED
CREATININE, URINE WAIVED: 300 mg/dL (ref 10–300)
MICROALB, UR WAIVED: 80 mg/L — AB (ref 0–19)

## 2017-01-22 MED ORDER — ESCITALOPRAM OXALATE 20 MG PO TABS: 20.0000 mg | ORAL_TABLET | Freq: Every day | ORAL | 1 refills | Status: DC

## 2017-01-22 MED ORDER — TRAZODONE HCL 100 MG PO TABS: 100.0000 mg | ORAL_TABLET | Freq: Every day | ORAL | 1 refills | Status: DC

## 2017-01-22 MED ORDER — PREDNISONE 50 MG PO TABS: 50.0000 mg | ORAL_TABLET | Freq: Every day | ORAL | 0 refills | Status: DC

## 2017-01-22 MED ORDER — METFORMIN HCL ER 500 MG PO TB24: 1000.0000 mg | ORAL_TABLET | Freq: Two times a day (BID) | ORAL | 1 refills | Status: DC

## 2017-01-22 MED ORDER — BUPROPION HCL ER (SR) 150 MG PO TB12: ORAL_TABLET | ORAL | 3 refills | Status: DC

## 2017-01-22 NOTE — Assessment & Plan Note (Signed)
Slightly exacerbated at this time. Will add wellbutrin and recheck in 1 month. Call with any concerns.

## 2017-01-22 NOTE — Assessment & Plan Note (Signed)
Under good control on current regimen. Continue current regimen. Call with any concerns.

## 2017-01-22 NOTE — Patient Instructions (Addendum)
Influenza (Flu) Vaccine (Inactivated or Recombinant): What You Need to Know 1. Why get vaccinated? Influenza ("flu") is a contagious disease that spreads around the [place] every year, usually between October and May. Flu is caused by influenza viruses, and is spread mainly by coughing, sneezing, and close contact. Anyone can get flu. Flu strikes suddenly and can last several days. Symptoms vary by age, but can include:  fever/chills  sore throat  muscle aches  fatigue  cough  headache  runny or stuffy nose  Flu can also lead to pneumonia and blood infections, and cause diarrhea and seizures in children. If you have a medical condition, such as heart or lung disease, flu can make it worse. Flu is more dangerous for some people. Infants and young children, people 23 years of age and older, pregnant women, and people with certain health conditions or a weakened immune system are at greatest risk. Each year thousands of people in the [place] die from flu, and many more are hospitalized. Flu vaccine can:  keep you from getting flu,  make flu less severe if you do get it, and  keep you from spreading flu to your family and other people. 2. Inactivated and recombinant flu vaccines A dose of flu vaccine is recommended every flu season. Children 6 months through 91 years of age may need two doses during the same flu season. Everyone else needs only one dose each flu season. Some inactivated flu vaccines contain a very small amount of a mercury-based preservative called thimerosal. Studies have not shown thimerosal in vaccines to be harmful, but flu vaccines that do not contain thimerosal are available. There is no live flu virus in flu shots. They cannot cause the flu. There are many flu viruses, and they are always changing. Each year a new flu vaccine is made to protect against three or four viruses that are likely to cause disease in the upcoming flu season. But even when the  vaccine doesn't exactly match these viruses, it may still provide some protection. Flu vaccine cannot prevent:  flu that is caused by a virus not covered by the vaccine, or  illnesses that look like flu but are not.  It takes about 2 weeks for protection to develop after vaccination, and protection lasts through the flu season. 3. Some people should not get this vaccine Tell the person who is giving you the vaccine:  If you have any severe, life-threatening allergies. If you ever had a life-threatening allergic reaction after a dose of flu vaccine, or have a severe allergy to any part of this vaccine, you may be advised not to get vaccinated. Most, but not all, types of flu vaccine contain a small amount of egg protein.  If you ever had Guillain-Barr Syndrome (also called GBS). Some people with a history of GBS should not get this vaccine. This should be discussed with your doctor.  If you are not feeling well. It is usually okay to get flu vaccine when you have a mild illness, but you might be asked to come back when you feel better.  4. Risks of a vaccine reaction With any medicine, including vaccines, there is a chance of reactions. These are usually mild and go away on their own, but serious reactions are also possible. Most people who get a flu shot do not have any problems with it. Minor problems following a flu shot include:  soreness, redness, or swelling where the shot was given  hoarseness  sore,  red or itchy eyes  cough  fever  aches  headache  itching  fatigue  If these problems occur, they usually begin soon after the shot and last 1 or 2 days. More serious problems following a flu shot can include the following:  There may be a small increased risk of Guillain-Barre Syndrome (GBS) after inactivated flu vaccine. This risk has been estimated at 1 or 2 additional cases per million people vaccinated. This is much lower than the risk of severe complications from  flu, which can be prevented by flu vaccine.  Young children who get the flu shot along with pneumococcal vaccine (PCV13) and/or DTaP vaccine at the same time might be slightly more likely to have a seizure caused by fever. Ask your doctor for more information. Tell your doctor if a child who is getting flu vaccine has ever had a seizure.  Problems that could happen after any injected vaccine:  People sometimes faint after a medical procedure, including vaccination. Sitting or lying down for about 15 minutes can help prevent fainting, and injuries caused by a fall. Tell your doctor if you feel dizzy, or have vision changes or ringing in the ears.  Some people get severe pain in the shoulder and have difficulty moving the arm where a shot was given. This happens very rarely.  Any medication can cause a severe allergic reaction. Such reactions from a vaccine are very rare, estimated at about 1 in a million doses, and would happen within a few minutes to a few hours after the vaccination. As with any medicine, there is a very remote chance of a vaccine causing a serious injury or death. The safety of vaccines is always being monitored. For more information, visit: http://www.aguilar.org/ 5. What if there is a serious reaction? What should I look for? Look for anything that concerns you, such as signs of a severe allergic reaction, very high fever, or unusual behavior. Signs of a severe allergic reaction can include hives, swelling of the face and throat, difficulty breathing, a fast heartbeat, dizziness, and weakness. These would start a few minutes to a few hours after the vaccination. What should I do?  If you think it is a severe allergic reaction or other emergency that can't wait, call 9-1-1 and get the person to the nearest hospital. Otherwise, call your doctor.  Reactions should be reported to the Vaccine Adverse Event Reporting System (VAERS). Your doctor should file this report, or you  can do it yourself through the VAERS web site at www.vaers.SamedayNews.es, or by calling 6094730752. ? VAERS does not give medical advice. 6. The National Vaccine Injury Compensation Program The [employer] Vaccine Injury Compensation Program (VICP) is a federal program that was created to compensate people who may have been injured by certain vaccines. Persons who believe they may have been injured by a vaccine can learn about the program and about filing a claim by calling 458-267-6070 or visiting the [employer] website at GoldCloset.com.ee. There is a time limit to file a claim for compensation. 7. How can I learn more?  Ask your healthcare provider. He or she can give you the vaccine package insert or suggest other sources of information.  Call your local or state health department.  Contact the Centers for Disease Control and Prevention (CDC): ? Call (540)164-9661 (1-800-CDC-INFO) or ? Visit CDC's website at https://gibson.com/ Vaccine Information Statement, Inactivated Influenza Vaccine (11/24/2013) This information is not intended to replace advice given to you by your health care provider. Make sure  you discuss any questions you have with your health care provider. Document Released: 01/29/2006 Document Revised: 12/26/2015 Document Reviewed: 12/26/2015 Elsevier Interactive Patient Education  2017 [employer] Inc.  Health Maintenance, Male A healthy lifestyle and preventive care is important for your health and wellness. Ask your health care provider about what schedule of regular examinations is right for you. What should I know about weight and diet? Eat a Healthy Diet  Eat plenty of vegetables, fruits, whole grains, low-fat dairy products, and lean protein.  Do not eat a lot of foods high in solid fats, added sugars, or salt.  Maintain a Healthy Weight Regular exercise can help you achieve or maintain a healthy weight. You should:  Do at least 150 minutes of exercise each  week. The exercise should increase your heart rate and make you sweat (moderate-intensity exercise).  Do strength-training exercises at least twice a week.  Watch Your Levels of Cholesterol and Blood Lipids  Have your blood tested for lipids and cholesterol every 5 years starting at 48 years of age. If you are at high risk for heart disease, you should start having your blood tested when you are 48 years old. You may need to have your cholesterol levels checked more often if: ? Your lipid or cholesterol levels are high. ? You are older than 48 years of age. ? You are at high risk for heart disease.  What should I know about cancer screening? Many types of cancers can be detected early and may often be prevented. Lung Cancer  You should be screened every year for lung cancer if: ? You are a current smoker who has smoked for at least 30 years. ? You are a former smoker who has quit within the past 15 years.  Talk to your health care provider about your screening options, when you should start screening, and how often you should be screened.  Colorectal Cancer  Routine colorectal cancer screening usually begins at 48 years of age and should be repeated every 5-10 years until you are 48 years old. You may need to be screened more often if early forms of precancerous polyps or small growths are found. Your health care provider may recommend screening at an earlier age if you have risk factors for colon cancer.  Your health care provider may recommend using home test kits to check for hidden blood in the stool.  A small camera at the end of a tube can be used to examine your colon (sigmoidoscopy or colonoscopy). This checks for the earliest forms of colorectal cancer.  Prostate and Testicular Cancer  Depending on your age and overall health, your health care provider may do certain tests to screen for prostate and testicular cancer.  Talk to your health care provider about any symptoms or  concerns you have about testicular or prostate cancer.  Skin Cancer  Check your skin from head to toe regularly.  Tell your health care provider about any new moles or changes in moles, especially if: ? There is a change in a mole's size, shape, or color. ? You have a mole that is larger than a pencil eraser.  Always use sunscreen. Apply sunscreen liberally and repeat throughout the day.  Protect yourself by wearing long sleeves, pants, a wide-brimmed hat, and sunglasses when outside.  What should I know about heart disease, diabetes, and high blood pressure?  If you are 18-39 years of age, have your blood pressure checked every 3-5 years. If you are 40 years   of age or older, have your blood pressure checked every year. You should have your blood pressure measured twice-once when you are at a hospital or clinic, and once when you are not at a hospital or clinic. Record the average of the two measurements. To check your blood pressure when you are not at a hospital or clinic, you can use: ? An automated blood pressure machine at a pharmacy. ? A home blood pressure monitor.  Talk to your health care provider about your target blood pressure.  If you are between 45-79 years old, ask your health care provider if you should take aspirin to prevent heart disease.  Have regular diabetes screenings by checking your fasting blood sugar level. ? If you are at a normal weight and have a low risk for diabetes, have this test once every three years after the age of 45. ? If you are overweight and have a high risk for diabetes, consider being tested at a younger age or more often.  A one-time screening for abdominal aortic aneurysm (AAA) by ultrasound is recommended for men aged 65-75 years who are current or former smokers. What should I know about preventing infection? Hepatitis B If you have a higher risk for hepatitis B, you should be screened for this virus. Talk with your health care provider  to find out if you are at risk for hepatitis B infection. Hepatitis C Blood testing is recommended for:  Everyone born from 1945 through 1965.  Anyone with known risk factors for hepatitis C.  Sexually Transmitted Diseases (STDs)  You should be screened each year for STDs including gonorrhea and chlamydia if: ? You are sexually active and are younger than 48 years of age. ? You are older than 48 years of age and your health care provider tells you that you are at risk for this type of infection. ? Your sexual activity has changed since you were last screened and you are at an increased risk for chlamydia or gonorrhea. Ask your health care provider if you are at risk.  Talk with your health care provider about whether you are at high risk of being infected with HIV. Your health care provider may recommend a prescription medicine to help prevent HIV infection.  What else can I do?  Schedule regular health, dental, and eye exams.  Stay current with your vaccines (immunizations).  Do not use any tobacco products, such as cigarettes, chewing tobacco, and e-cigarettes. If you need help quitting, ask your health care provider.  Limit alcohol intake to no more than 2 drinks per day. One drink equals 12 ounces of beer, 5 ounces of wine, or 1 ounces of hard liquor.  Do not use street drugs.  Do not share needles.  Ask your health care provider for help if you need support or information about quitting drugs.  Tell your health care provider if you often feel depressed.  Tell your health care provider if you have ever been abused or do not feel safe at home. This information is not intended to replace advice given to you by your health care provider. Make sure you discuss any questions you have with your health care provider. Document Released: 10/03/2007 Document Revised: 12/04/2015 Document Reviewed: 01/08/2015 Elsevier Interactive Patient Education  2018 [employer] Inc.  

## 2017-01-22 NOTE — Progress Notes (Signed)
BP 128/85 (BP Location: Left Arm, Patient Position: Sitting, Cuff Size: Large)   Pulse 76   Temp 98.7 F (37.1 C)   Ht 5' 10.7" (1.796 m)   Wt 259 lb 6 oz (117.7 kg)   SpO2 97%   BMI 36.48 kg/m    Subjective:    Patient ID: Glenn Flynn, male    DOB: July 30, 1970, 46 y.o.   MRN: [id number]  HPI: Glenn Flynn is a 47 y.o. male presenting on 01/22/2017 for comprehensive medical examination. Current medical complaints include:  DIABETES Hypoglycemic episodes:no Polydipsia/polyuria: no Visual disturbance: no Chest pain: no Paresthesias: no Glucose Monitoring: no  Accucheck frequency: Not Checking Taking Insulin?: no Blood Pressure Monitoring: not checking Retinal Examination: Up to Date Foot Exam: Up to Date Diabetic Education: Completed Pneumovax: Up to Date Influenza: Up to Date Aspirin: yes  HYPERLIPIDEMIA Hyperlipidemia status: Monitoring Satisfied with current treatment?  yes Side effects:  Not on anything Supplements: none Aspirin:  yes The 10-year ASCVD risk score [name], et al., 2013) is: 5.6%   Values used to calculate the score:     Age: 28 years     Sex: Male     Is Non-Hispanic African American: No     Diabetic: Yes     Tobacco smoker: No     Systolic Blood Pressure: 025 mmHg     Is BP treated: No     HDL Cholesterol: 41 mg/dL     Total Cholesterol: 190 mg/dL Chest pain:  no Coronary artery disease:  no Family history CAD:  yes  ANXIETY/STRESS- feels a little worse, work is tough, hasn't needed to take the xanax all that often Duration:exacerbated Anxious mood: yes  Excessive worrying: yes Irritability: no  Sweating: no Nausea: no Palpitations:no Hyperventilation: no Panic attacks: no Agoraphobia: no  Obscessions/compulsions: no Depressed mood: yes Depression screen [facility] 2/9 01/22/2017 10/26/2016 01/16/2016  Decreased Interest 1 0 0  Down, Depressed, Hopeless 1 1 0  PHQ - 2 Score 2 1 0  Altered sleeping 1 1 -  Tired,  decreased energy 2 1 -  Change in appetite 0 1 -  Feeling bad or failure about yourself  1 1 -  Trouble concentrating 1 0 -  Moving slowly or fidgety/restless 0 0 -  Suicidal thoughts 0 0 -  PHQ-9 Score 7 5 -  Difficult doing work/chores Somewhat difficult - -  Anhedonia: no Weight changes: no Insomnia: yes hard to fall asleep  Hypersomnia: no Fatigue/loss of energy: yes Feelings of worthlessness: no Feelings of guilt: no Impaired concentration/indecisiveness: no Suicidal ideations: no  Crying spells: no Recent Stressors/Life Changes: yes   Relationship problems: no   Family stress: no     Financial stress: no    Job stress: yes    Recent death/loss: no  UPPER RESPIRATORY TRACT INFECTION Duration 3-4 weeks Worst symptom: cough, congestion Fever: no Cough: yes Shortness of breath: no Wheezing: no Chest pain: no Chest tightness: no Chest congestion: yes Nasal congestion: yes Runny nose: yes Post nasal drip: no Sneezing: no Sore throat: yes Swollen glands: no Sinus pressure: yes Headache: no Face pain: no Toothache: no Ear pain:  no Ear pressure: no  Eyes red/itching:no Eye drainage/crusting: no  Vomiting: no Rash: no Fatigue: yes Sick contacts: no Strep contacts: no  Context: better Recurrent sinusitis: no Relief with OTC cold/cough medications: no  Treatments attempted: flonase   Interim Problems from his last visit: URI  Past Medical History:  Past Medical  History:  Diagnosis Date  . Agoraphobia   . Allergy   . Anxiety   . Depression   . Diabetes mellitus without complication ([place])   . Hyperlipidemia   . Migraines     Surgical History:  Past Surgical History:  Procedure Laterality Date  . TONSILLECTOMY  Age 56    Medications:  Current Outpatient Prescriptions on File Prior to Visit  Medication Sig  . ALPRAZolam (XANAX) 0.5 MG tablet Take 1 tablet (0.5 mg total) by mouth 2 (two) times daily as needed for anxiety.  Marland Kitchen aspirin EC 81 MG  tablet Take 81 mg by mouth daily.  . Multiple Vitamins-Minerals (MULTIVITAMIN WITH MINERALS) tablet Take 1 tablet by mouth daily.   No current facility-administered medications on file prior to visit.     Allergies:  No Known Allergies  Social History:  Social History   Social History  . Marital status: Single    Spouse name: N/A  . Number of children: N/A  . Years of education: N/A   Occupational History  . Not on file.   Social History Main Topics  . Smoking status: Never Smoker  . Smokeless tobacco: Never Used  . Alcohol use No     Comment: rare  . Drug use: No  . Sexual activity: Yes   Other Topics Concern  . Not on file   Social History Narrative  . No narrative on file   History  Smoking Status  . Never Smoker  Smokeless Tobacco  . Never Used   History  Alcohol Use No    Comment: rare    Family History:  Family History  Problem Relation Age of Onset  . Diabetes Mother   . Hypertension Mother   . Diabetes Father   . Hypertension Father   . Heart disease Maternal Grandmother   . Heart disease Maternal Grandfather   . Stroke Paternal Grandfather   . Cancer Neg Hx   . COPD Neg Hx     Past medical history, surgical history, medications, allergies, family history and social history reviewed with patient today and changes made to appropriate areas of the chart.   Review of Systems  Constitutional: Negative.   HENT: Negative.   Eyes: Negative.   Respiratory: Positive for cough. Negative for hemoptysis, sputum production, shortness of breath and wheezing.   Cardiovascular: Negative.   Gastrointestinal: Positive for diarrhea and heartburn. Negative for abdominal pain, blood in stool, constipation, melena, nausea and vomiting.  Genitourinary: Negative.   Musculoskeletal: Negative.   Skin: Negative.   Endo/Heme/Allergies: Negative.   Psychiatric/Behavioral: Positive for depression. Negative for hallucinations, memory loss, substance abuse and  suicidal ideas. The patient is nervous/anxious. The patient does not have insomnia.     All other ROS negative except what is listed above and in the HPI.      Objective:    BP 128/85 (BP Location: Left Arm, Patient Position: Sitting, Cuff Size: Large)   Pulse 76   Temp 98.7 F (37.1 C)   Ht 5' 10.7" (1.796 m)   Wt 259 lb 6 oz (117.7 kg)   SpO2 97%   BMI 36.48 kg/m   Wt Readings from Last 3 Encounters:  01/22/17 259 lb 6 oz (117.7 kg)  10/26/16 262 lb 9 oz (119.1 kg)  07/15/16 266 lb (120.7 kg)    Physical Exam  Constitutional: He is oriented to person, place, and time. He appears well-developed and well-nourished. No distress.  HENT:  Head: Normocephalic and atraumatic.  Right  Ear: Hearing, tympanic membrane, external ear and ear canal normal.  Left Ear: Hearing, tympanic membrane, external ear and ear canal normal.  Nose: Nose normal.  Mouth/Throat: Uvula is midline, oropharynx is clear and moist and mucous membranes are normal. No oropharyngeal exudate.  Eyes: Pupils are equal, round, and reactive to light. Conjunctivae, EOM and lids are normal. Right eye exhibits no discharge. Left eye exhibits no discharge. No scleral icterus.  Neck: Normal range of motion. Neck supple. No JVD present. No tracheal deviation present. No thyromegaly present.  Cardiovascular: Normal rate, regular rhythm, normal heart sounds and intact distal pulses.  Exam reveals no gallop and no friction rub.   No murmur heard. Pulmonary/Chest: Effort normal and breath sounds normal. No stridor. No respiratory distress. He has no wheezes. He has no rales. He exhibits no tenderness.  Abdominal: Soft. Bowel sounds are normal. He exhibits no distension and no mass. There is no tenderness. There is no rebound and no guarding. Hernia confirmed negative in the right inguinal area and confirmed negative in the left inguinal area.  Genitourinary: Testes normal and penis normal. Cremasteric reflex is present. Right  testis shows no mass, no swelling and no tenderness. Right testis is descended. Cremasteric reflex is not absent on the right side. Left testis shows no mass, no swelling and no tenderness. Left testis is descended. Cremasteric reflex is not absent on the left side. Circumcised. No phimosis, paraphimosis, hypospadias, penile erythema or penile tenderness. No discharge found.  Musculoskeletal: Normal range of motion. He exhibits no edema, tenderness or deformity.  Lymphadenopathy:    He has no cervical adenopathy.       Right: No inguinal adenopathy present.       Left: No inguinal adenopathy present.  Neurological: He is alert and oriented to person, place, and time. He has normal reflexes. He displays normal reflexes. No cranial nerve deficit. He exhibits normal muscle tone. Coordination normal.  Skin: Skin is warm, dry and intact. No rash noted. He is not diaphoretic. No erythema. No pallor.  Psychiatric: He has a normal mood and affect. His speech is normal and behavior is normal. Judgment and thought content normal. Cognition and memory are normal.  Nursing note and vitals reviewed.   Results for orders placed or performed in visit on 01/22/17  Microscopic Examination  Result Value Ref Range   WBC, UA 0-5 0 - 5 /hpf   RBC, UA 0-2 0 - 2 /hpf   Epithelial Cells (non renal) CANCELED    Mucus, UA Present (A) Not Estab.   Bacteria, UA None seen None seen/Few  Bayer DCA Hb A1c Waived  Result Value Ref Range   Bayer DCA Hb A1c Waived 7.8 (H) <7.0 %  Microalbumin, Urine Waived  Result Value Ref Range   Microalb, Ur Waived 80 (H) 0 - 19 mg/L   Creatinine, Urine Waived 300 10 - 300 mg/dL   Microalb/Creat Ratio 30-300 (H) <30 mg/g  UA/M w/rflx Culture, Routine  Result Value Ref Range   Specific Gravity, UA >1.030 (H) 1.005 - 1.030   pH, UA 5.0 5.0 - 7.5   Color, UA Yellow Yellow   Appearance Ur Clear Clear   Leukocytes, UA Negative Negative   Protein, UA Negative Negative/Trace    Glucose, UA Negative Negative   Ketones, UA Trace (A) Negative   RBC, UA Trace (A) Negative   Bilirubin, UA Negative Negative   Urobilinogen, Ur 0.2 0.2 - 1.0 mg/dL   Nitrite, UA Negative Negative  Microscopic Examination See below:   HM DIABETES EYE EXAM  Result Value Ref Range   HM Diabetic Eye Exam No Retinopathy No Retinopathy      Assessment & Plan:   Problem List Items Addressed This Visit      Endocrine   Type 2 diabetes mellitus ([place])    Not under great control with A1c of 7.8. Continue current regimen. Continue to monitor. Recheck 3 months.       Relevant Medications   metFORMIN (GLUCOPHAGE-XR) 500 MG 24 hr tablet   Other Relevant Orders   Bayer DCA Hb A1c Waived (Completed)   CBC with Differential/Platelet   Comprehensive metabolic panel   Microalbumin, Urine Waived (Completed)   TSH   UA/M w/rflx Culture, Routine (Completed)     Other   Hyperlipidemia    Rechecking levels. Await results. Call with any concerns.       Relevant Orders   CBC with Differential/Platelet   Comprehensive metabolic panel   Lipid Panel w/o Chol/HDL Ratio   UA/M w/rflx Culture, Routine (Completed)   Anxiety disorder    Slightly exacerbated at this time. Will add wellbutrin and recheck in 1 month. Call with any concerns.       Relevant Medications   traZODone (DESYREL) 100 MG tablet   escitalopram (LEXAPRO) 20 MG tablet   buPROPion (WELLBUTRIN SR) 150 MG 12 hr tablet   Other Relevant Orders   CBC with Differential/Platelet   Comprehensive metabolic panel   TSH   UA/M w/rflx Culture, Routine (Completed)   Insomnia    Under good control on current regimen. Continue current regimen. Call with any concerns.        Other Visit Diagnoses    Routine general medical examination at a health care facility    -  Primary   Vaccines up to date. Screening labs checked today. Continue diet and exercise. Call with any concerns.    Relevant Orders   CBC with Differential/Platelet    Comprehensive metabolic panel   UA/M w/rflx Culture, Routine (Completed)   Immunization due       Flu shot given today.   Relevant Orders   Flu Vaccine QUAD 6+ mos PF IM (Fluarix Quad PF) (Completed)   Nasal congestion       No sign of infection. Will treat with burst of prednisone. Let us know if not getting better or getting worse.       LABORATORY TESTING:  Health maintenance labs ordered today as discussed above.   IMMUNIZATIONS:   - Tdap: Tetanus vaccination status reviewed: last tetanus booster within 10 years. - Influenza: Administered today - Pneumovax: Up to date  PATIENT COUNSELING:    Sexuality: Discussed sexually transmitted diseases, partner selection, use of condoms, avoidance of unintended pregnancy  and contraceptive alternatives.   Advised to avoid cigarette smoking.  I discussed with the patient that most people either abstain from alcohol or drink within safe limits (<=14/week and <=4 drinks/occasion for males, <=7/weeks and <= 3 drinks/occasion for females) and that the risk for alcohol disorders and other health effects rises proportionally with the number of drinks per week and how often a drinker exceeds daily limits.  Discussed cessation/primary prevention of drug use and availability of treatment for abuse.   Diet: Encouraged to adjust caloric intake to maintain  or achieve ideal body weight, to reduce intake of dietary saturated fat and total fat, to limit sodium intake by avoiding high sodium foods and not adding table salt, and to maintain  adequate dietary potassium and calcium preferably from fresh fruits, vegetables, and low-fat dairy products.    stressed the importance of regular exercise  Injury prevention: Discussed safety belts, safety helmets, smoke detector, smoking near bedding or upholstery.   Dental health: Discussed importance of regular tooth brushing, flossing, and dental visits.   Follow up plan: NEXT PREVENTATIVE PHYSICAL DUE IN 1  YEAR. Return in about 4 weeks (around 02/19/2017).

## 2017-01-22 NOTE — Assessment & Plan Note (Signed)
Rechecking levels. Await results. Call with any concerns.  

## 2017-01-22 NOTE — Assessment & Plan Note (Addendum)
Not under great control with A1c of 7.8. Continue current regimen. Continue to monitor. Recheck 3 months.

## 2017-01-23 LAB — CBC WITH DIFFERENTIAL/PLATELET
BASOS ABS: 0 10*3/uL (ref 0.0–0.2)
Basos: 0 %
EOS (ABSOLUTE): 0.6 10*3/uL — AB (ref 0.0–0.4)
Eos: 7 %
HEMOGLOBIN: 15 g/dL (ref 13.0–17.7)
Hematocrit: 45.9 % (ref 37.5–51.0)
Immature Grans (Abs): 0 10*3/uL (ref 0.0–0.1)
Immature Granulocytes: 0 %
LYMPHS ABS: 2.4 10*3/uL (ref 0.7–3.1)
Lymphs: 29 %
MCH: 29.8 pg (ref 26.6–33.0)
MCHC: 32.7 g/dL (ref 31.5–35.7)
MCV: 91 fL (ref 79–97)
Monocytes Absolute: 0.7 10*3/uL (ref 0.1–0.9)
Monocytes: 8 %
NEUTROS ABS: 4.7 10*3/uL (ref 1.4–7.0)
Neutrophils: 56 %
PLATELETS: 276 10*3/uL (ref 150–379)
RBC: 5.03 x10E6/uL (ref 4.14–5.80)
RDW: 13.5 % (ref 12.3–15.4)
WBC: 8.4 10*3/uL (ref 3.4–10.8)

## 2017-01-23 LAB — COMPREHENSIVE METABOLIC PANEL
A/G RATIO: 1.7 (ref 1.2–2.2)
ALBUMIN: 4.4 g/dL (ref 3.5–5.5)
ALK PHOS: 67 IU/L (ref 39–117)
ALT: 39 IU/L (ref 0–44)
AST: 20 IU/L (ref 0–40)
BILIRUBIN TOTAL: 0.3 mg/dL (ref 0.0–1.2)
BUN / CREAT RATIO: 19 (ref 9–20)
BUN: 17 mg/dL (ref 6–24)
CHLORIDE: 102 mmol/L (ref 96–106)
CO2: 21 mmol/L (ref 20–29)
Calcium: 9.6 mg/dL (ref 8.7–10.2)
Creatinine, Ser: 0.9 mg/dL (ref 0.76–1.27)
GFR calc Af Amer: 117 mL/min/{1.73_m2} (ref >59)
GFR calc non Af Amer: 101 mL/min/{1.73_m2} (ref >59)
GLUCOSE: 184 mg/dL — AB (ref 65–99)
Globulin, Total: 2.6 g/dL (ref 1.5–4.5)
POTASSIUM: 4.7 mmol/L (ref 3.5–5.2)
Sodium: 137 mmol/L (ref 134–144)
Total Protein: 7 g/dL (ref 6.0–8.5)

## 2017-01-23 LAB — LIPID PANEL W/O CHOL/HDL RATIO
CHOLESTEROL TOTAL: 203 mg/dL — AB (ref 100–199)
HDL: 46 mg/dL (ref >39)
LDL Calculated: 105 mg/dL — ABNORMAL HIGH (ref 0–99)
Triglycerides: 260 mg/dL — ABNORMAL HIGH (ref 0–149)
VLDL CHOLESTEROL CAL: 52 mg/dL — AB (ref 5–40)

## 2017-01-23 LAB — TSH: TSH: 2.59 u[IU]/mL (ref 0.450–4.500)

## 2017-02-11 ENCOUNTER — Other Ambulatory Visit: Payer: Self-pay | Admitting: Family Medicine

## 2017-02-11 NOTE — Telephone Encounter (Signed)
Medication refill

## 2017-02-11 NOTE — Telephone Encounter (Signed)
Routing to provider  

## 2017-02-11 NOTE — Telephone Encounter (Signed)
Can you please check to see if he's totally out- he told me he wasn't using it all that often, and this would mean he's taking about 1x a day if he's totally out

## 2017-02-11 NOTE — Telephone Encounter (Signed)
Called patient, no answer, left a message for patient to return my call.  

## 2017-02-12 NOTE — Telephone Encounter (Signed)
Patient does not need a refill

## 2017-02-22 ENCOUNTER — Encounter: Payer: Self-pay | Admitting: Family Medicine

## 2017-02-22 ENCOUNTER — Ambulatory Visit (INDEPENDENT_AMBULATORY_CARE_PROVIDER_SITE_OTHER): Payer: BLUE CROSS/BLUE SHIELD | Admitting: Family Medicine

## 2017-02-22 VITALS — BP 126/81 | HR 75 | Temp 98.9°F | Wt 259.5 lb

## 2017-02-22 DIAGNOSIS — J0100 Acute maxillary sinusitis, unspecified: Secondary | ICD-10-CM | POA: Diagnosis not present

## 2017-02-22 DIAGNOSIS — F411 Generalized anxiety disorder: Secondary | ICD-10-CM

## 2017-02-22 MED ORDER — AZITHROMYCIN 250 MG PO TABS: ORAL_TABLET | ORAL | 0 refills | Status: DC

## 2017-02-22 MED ORDER — ALPRAZOLAM 0.5 MG PO TABS: 0.5000 mg | ORAL_TABLET | Freq: Two times a day (BID) | ORAL | 2 refills | Status: DC | PRN

## 2017-02-22 MED ORDER — BUPROPION HCL ER (SR) 150 MG PO TB12: 150.0000 mg | ORAL_TABLET | Freq: Two times a day (BID) | ORAL | 1 refills | Status: DC

## 2017-02-22 NOTE — Progress Notes (Signed)
BP 126/81 (BP Location: Left Arm, Patient Position: Sitting, Cuff Size: Large)   Pulse 75   Temp 98.9 F (37.2 C)   Wt 259 lb 8 oz (117.7 kg)   SpO2 96%   BMI 36.50 kg/m    Subjective:    Patient ID: Glenn Flynn, male    DOB: 1970-12-03, 46 y.o.   MRN: [id number]  HPI: Glenn Flynn is a 46 y.o. male  Chief Complaint  Patient presents with  . Anxiety   ANXIETY/STRESS- feeling better. He notes that anxiety hits him in different ways, but seems better.  Duration:better Anxious mood: yes  Excessive worrying: no Irritability: no  Sweating: no Nausea: no Palpitations:no Hyperventilation: no Panic attacks: no Agoraphobia: no  Obscessions/compulsions: no Depressed mood: no Depression screen [facility] 2/9 02/22/2017 01/22/2017 10/26/2016 01/16/2016  Decreased Interest 1 1 0 0  Down, Depressed, Hopeless 1 1 1  0  PHQ - 2 Score 2 2 1  0  Altered sleeping - 1 1 -  Tired, decreased energy - 2 1 -  Change in appetite - 0 1 -  Feeling bad or failure about yourself  - 1 1 -  Trouble concentrating - 1 0 -  Moving slowly or fidgety/restless - 0 0 -  Suicidal thoughts - 0 0 -  PHQ-9 Score - 7 5 -  Difficult doing work/chores - Somewhat difficult - -   GAD 7 : Generalized Anxiety Score 02/22/2017 10/26/2016 04/16/2016 01/16/2016  Nervous, Anxious, on Edge 1 2 3 3   Control/stop worrying 1 1 1 1   Worry too much - different things 0 1 1 1   Trouble relaxing 0 0 2 1  Restless 0 0 0 0  Easily annoyed or irritable 0 0 0 0  Afraid - awful might happen 0 0 0 0  Total GAD 7 Score 2 4 7 6   Anxiety Difficulty Somewhat difficult Not difficult at all Somewhat difficult Somewhat difficult   Anhedonia: no Weight changes: no Insomnia: no  Hypersomnia: no Fatigue/loss of energy: no Feelings of worthlessness: no Feelings of guilt: no Impaired concentration/indecisiveness: no Suicidal ideations: no  Crying spells: no Recent Stressors/Life Changes: no   Relationship problems: no   Family  stress: no     Financial stress: yes    Job stress: yes    Recent death/loss: no  UPPER RESPIRATORY TRACT INFECTION Duration: 3-4 weeks Worst symptom: cough Fever: no Cough: yes Shortness of breath: no Wheezing: no Chest pain: no Chest tightness: no Chest congestion: yes Nasal congestion: no Runny nose: yes Post nasal drip: no Sneezing: yes Sore throat: no Swollen glands: yes Sinus pressure: no Headache: no Face pain: no Toothache: yes Ear pain: no  Ear pressure: no  Eyes red/itching:no Eye drainage/crusting: no  Vomiting: no Rash: no Fatigue: yes Sick contacts: no Strep contacts: no  Context: stable Recurrent sinusitis: no Relief with OTC cold/cough medications: no  Treatments attempted: none   Relevant past medical, surgical, family and social history reviewed and updated as indicated. Interim medical history since our last visit reviewed. Allergies and medications reviewed and updated.  Review of Systems  Constitutional: Negative.   HENT: Positive for congestion, postnasal drip, rhinorrhea, sneezing and sore throat. Negative for dental problem, drooling, ear discharge, ear pain, facial swelling, hearing loss, mouth sores, nosebleeds, sinus pressure, sinus pain, tinnitus, trouble swallowing and voice change.   Respiratory: Negative.   Cardiovascular: Negative.   Psychiatric/Behavioral: Negative.     Per HPI unless specifically indicated above  Objective:    BP 126/81 (BP Location: Left Arm, Patient Position: Sitting, Cuff Size: Large)   Pulse 75   Temp 98.9 F (37.2 C)   Wt 259 lb 8 oz (117.7 kg)   SpO2 96%   BMI 36.50 kg/m   Wt Readings from Last 3 Encounters:  02/22/17 259 lb 8 oz (117.7 kg)  01/22/17 259 lb 6 oz (117.7 kg)  10/26/16 262 lb 9 oz (119.1 kg)    Physical Exam  Constitutional: He is oriented to person, place, and time. He appears well-developed and well-nourished. No distress.  HENT:  Head: Normocephalic and atraumatic.    Right Ear: Hearing, tympanic membrane, external ear and ear canal normal.  Left Ear: Hearing, tympanic membrane, external ear and ear canal normal.  Nose: Mucosal edema and rhinorrhea present. Right sinus exhibits maxillary sinus tenderness. Right sinus exhibits no frontal sinus tenderness. Left sinus exhibits no maxillary sinus tenderness and no frontal sinus tenderness.  Mouth/Throat: Uvula is midline, oropharynx is clear and moist and mucous membranes are normal. No oropharyngeal exudate.  Significant PND  Eyes: Conjunctivae and lids are normal. Right eye exhibits no discharge. Left eye exhibits no discharge. No scleral icterus.  Pulmonary/Chest: Effort normal. No respiratory distress.  Musculoskeletal: Normal range of motion.  Neurological: He is alert and oriented to person, place, and time.  Skin: Skin is intact. No rash noted. He is not diaphoretic.  Psychiatric: He has a normal mood and affect. His speech is normal and behavior is normal. Judgment and thought content normal. Cognition and memory are normal.    Results for orders placed or performed in visit on 01/22/17  Microscopic Examination  Result Value Ref Range   WBC, UA 0-5 0 - 5 /hpf   RBC, UA 0-2 0 - 2 /hpf   Epithelial Cells (non renal) CANCELED    Mucus, UA Present (A) Not Estab.   Bacteria, UA None seen None seen/Few  Bayer DCA Hb A1c Waived  Result Value Ref Range   Bayer DCA Hb A1c Waived 7.8 (H) <7.0 %  CBC with Differential/Platelet  Result Value Ref Range   WBC 8.4 3.4 - 10.8 x10E3/uL   RBC 5.03 4.14 - 5.80 x10E6/uL   Hemoglobin 15.0 13.0 - 17.7 g/dL   Hematocrit 45.9 37.5 - 51.0 %   MCV 91 79 - 97 fL   MCH 29.8 26.6 - 33.0 pg   MCHC 32.7 31.5 - 35.7 g/dL   RDW 13.5 12.3 - 15.4 %   Platelets 276 150 - 379 x10E3/uL   Neutrophils 56 Not Estab. %   Lymphs 29 Not Estab. %   Monocytes 8 Not Estab. %   Eos 7 Not Estab. %   Basos 0 Not Estab. %   Neutrophils Absolute 4.7 1.4 - 7.0 x10E3/uL   Lymphocytes  Absolute 2.4 0.7 - 3.1 x10E3/uL   Monocytes Absolute 0.7 0.1 - 0.9 x10E3/uL   EOS (ABSOLUTE) 0.6 (H) 0.0 - 0.4 x10E3/uL   Basophils Absolute 0.0 0.0 - 0.2 x10E3/uL   Immature Granulocytes 0 Not Estab. %   Immature Grans (Abs) 0.0 0.0 - 0.1 x10E3/uL  Comprehensive metabolic panel  Result Value Ref Range   Glucose 184 (H) 65 - 99 mg/dL   BUN 17 6 - 24 mg/dL   Creatinine, Ser 0.90 0.76 - 1.27 mg/dL   GFR calc non Af Amer 101 >59 mL/min/1.73   GFR calc Af Amer 117 >59 mL/min/1.73   BUN/Creatinine Ratio 19 9 - 20   Sodium  137 134 - 144 mmol/L   Potassium 4.7 3.5 - 5.2 mmol/L   Chloride 102 96 - 106 mmol/L   CO2 21 20 - 29 mmol/L   Calcium 9.6 8.7 - 10.2 mg/dL   Total Protein 7.0 6.0 - 8.5 g/dL   Albumin 4.4 3.5 - 5.5 g/dL   Globulin, Total 2.6 1.5 - 4.5 g/dL   Albumin/Globulin Ratio 1.7 1.2 - 2.2   Bilirubin Total 0.3 0.0 - 1.2 mg/dL   Alkaline Phosphatase 67 39 - 117 IU/L   AST 20 0 - 40 IU/L   ALT 39 0 - 44 IU/L  Lipid Panel w/o Chol/HDL Ratio  Result Value Ref Range   Cholesterol, Total 203 (H) 100 - 199 mg/dL   Triglycerides 260 (H) 0 - 149 mg/dL   HDL 46 >39 mg/dL   VLDL Cholesterol Cal 52 (H) 5 - 40 mg/dL   LDL Calculated 105 (H) 0 - 99 mg/dL  Microalbumin, Urine Waived  Result Value Ref Range   Microalb, Ur Waived 80 (H) 0 - 19 mg/L   Creatinine, Urine Waived 300 10 - 300 mg/dL   Microalb/Creat Ratio 30-300 (H) <30 mg/g  TSH  Result Value Ref Range   TSH 2.590 0.450 - 4.500 uIU/mL  UA/M w/rflx Culture, Routine  Result Value Ref Range   Specific Gravity, UA >1.030 (H) 1.005 - 1.030   pH, UA 5.0 5.0 - 7.5   Color, UA Yellow Yellow   Appearance Ur Clear Clear   Leukocytes, UA Negative Negative   Protein, UA Negative Negative/Trace   Glucose, UA Negative Negative   Ketones, UA Trace (A) Negative   RBC, UA Trace (A) Negative   Bilirubin, UA Negative Negative   Urobilinogen, Ur 0.2 0.2 - 1.0 mg/dL   Nitrite, UA Negative Negative   Microscopic Examination See below:    HM DIABETES EYE EXAM  Result Value Ref Range   HM Diabetic Eye Exam No Retinopathy No Retinopathy      Assessment & Plan:   Problem List Items Addressed This Visit      Other   Anxiety disorder    Better on current regimen. Continue current regimen. Call with any concerns.       Relevant Medications   buPROPion (WELLBUTRIN SR) 150 MG 12 hr tablet   ALPRAZolam (XANAX) 0.5 MG tablet    Other Visit Diagnoses    Acute non-recurrent maxillary sinusitis    -  Primary   Will treat with azithromycin. Start OTC allergy medicine. Call with any concerns.    Relevant Medications   azithromycin (ZITHROMAX) 250 MG tablet       Follow up plan: Return in about 2 months (around 04/24/2017) for DM follow up.

## 2017-02-22 NOTE — Assessment & Plan Note (Signed)
Better on current regimen. Continue current regimen. Call with any concerns.

## 2017-04-26 ENCOUNTER — Encounter: Payer: Self-pay | Admitting: Family Medicine

## 2017-04-26 ENCOUNTER — Ambulatory Visit (INDEPENDENT_AMBULATORY_CARE_PROVIDER_SITE_OTHER): Payer: BLUE CROSS/BLUE SHIELD | Admitting: Family Medicine

## 2017-04-26 VITALS — BP 129/81 | HR 64 | Temp 97.7°F | Wt 255.4 lb

## 2017-04-26 DIAGNOSIS — E119 Type 2 diabetes mellitus without complications: Secondary | ICD-10-CM | POA: Diagnosis not present

## 2017-04-26 LAB — BAYER DCA HB A1C WAIVED: HB A1C: 7.7 % — AB (ref <7.0)

## 2017-04-26 MED ORDER — EMPAGLIFLOZIN 25 MG PO TABS: 25.0000 mg | ORAL_TABLET | Freq: Every day | ORAL | 3 refills | Status: DC

## 2017-04-26 NOTE — Progress Notes (Signed)
BP 129/81 (BP Location: Left Arm, Patient Position: Sitting, Cuff Size: Large)   Pulse 64   Temp 97.7 F (36.5 C)   Wt 255 lb 6 oz (115.8 kg)   SpO2 97%   BMI 35.92 kg/m    Subjective:    Patient ID: Glenn Flynn, male    DOB: 19-Jan-1971, 47 y.o.   MRN: [id number]  HPI: Glenn Flynn is a 47 y.o. male  Chief Complaint  Patient presents with  . Diabetes   DIABETES Hypoglycemic episodes:no Polydipsia/polyuria: no Visual disturbance: no Chest pain: no Paresthesias: no Glucose Monitoring: no Taking Insulin?: no Blood Pressure Monitoring: not checking Retinal Examination: Not up to Date Foot Exam: Up to Date Diabetic Education: Completed Pneumovax: Up to Date Influenza: Up to Date Aspirin: no  Relevant past medical, surgical, family and social history reviewed and updated as indicated. Interim medical history since our last visit reviewed. Allergies and medications reviewed and updated.  Review of Systems  Constitutional: Negative.   Respiratory: Negative.   Cardiovascular: Negative.   Psychiatric/Behavioral: Negative.     Per HPI unless specifically indicated above     Objective:    BP 129/81 (BP Location: Left Arm, Patient Position: Sitting, Cuff Size: Large)   Pulse 64   Temp 97.7 F (36.5 C)   Wt 255 lb 6 oz (115.8 kg)   SpO2 97%   BMI 35.92 kg/m   Wt Readings from Last 3 Encounters:  04/26/17 255 lb 6 oz (115.8 kg)  02/22/17 259 lb 8 oz (117.7 kg)  01/22/17 259 lb 6 oz (117.7 kg)    Physical Exam  Constitutional: He is oriented to person, place, and time. He appears well-developed and well-nourished. No distress.  HENT:  Head: Normocephalic and atraumatic.  Right Ear: Hearing normal.  Left Ear: Hearing normal.  Nose: Nose normal.  Eyes: Conjunctivae and lids are normal. Right eye exhibits no discharge. Left eye exhibits no discharge. No scleral icterus.  Cardiovascular: Normal rate, regular rhythm, normal heart sounds and intact  distal pulses. Exam reveals no gallop and no friction rub.  No murmur heard. Pulmonary/Chest: Effort normal and breath sounds normal. No respiratory distress. He has no wheezes. He has no rales. He exhibits no tenderness.  Musculoskeletal: Normal range of motion.  Neurological: He is alert and oriented to person, place, and time.  Skin: Skin is warm, dry and intact. No rash noted. He is not diaphoretic. No erythema. No pallor.  Psychiatric: He has a normal mood and affect. His speech is normal and behavior is normal. Judgment and thought content normal. Cognition and memory are normal.  Nursing note and vitals reviewed.   Results for orders placed or performed in visit on 01/22/17  Microscopic Examination  Result Value Ref Range   WBC, UA 0-5 0 - 5 /hpf   RBC, UA 0-2 0 - 2 /hpf   Epithelial Cells (non renal) CANCELED    Mucus, UA Present (A) Not Estab.   Bacteria, UA None seen None seen/Few  Bayer DCA Hb A1c Waived  Result Value Ref Range   Bayer DCA Hb A1c Waived 7.8 (H) <7.0 %  CBC with Differential/Platelet  Result Value Ref Range   WBC 8.4 3.4 - 10.8 x10E3/uL   RBC 5.03 4.14 - 5.80 x10E6/uL   Hemoglobin 15.0 13.0 - 17.7 g/dL   Hematocrit 45.9 37.5 - 51.0 %   MCV 91 79 - 97 fL   MCH 29.8 26.6 - 33.0 pg   MCHC 32.7  31.5 - 35.7 g/dL   RDW 13.5 12.3 - 15.4 %   Platelets 276 150 - 379 x10E3/uL   Neutrophils 56 Not Estab. %   Lymphs 29 Not Estab. %   Monocytes 8 Not Estab. %   Eos 7 Not Estab. %   Basos 0 Not Estab. %   Neutrophils Absolute 4.7 1.4 - 7.0 x10E3/uL   Lymphocytes Absolute 2.4 0.7 - 3.1 x10E3/uL   Monocytes Absolute 0.7 0.1 - 0.9 x10E3/uL   EOS (ABSOLUTE) 0.6 (H) 0.0 - 0.4 x10E3/uL   Basophils Absolute 0.0 0.0 - 0.2 x10E3/uL   Immature Granulocytes 0 Not Estab. %   Immature Grans (Abs) 0.0 0.0 - 0.1 x10E3/uL  Comprehensive metabolic panel  Result Value Ref Range   Glucose 184 (H) 65 - 99 mg/dL   BUN 17 6 - 24 mg/dL   Creatinine, Ser 0.90 0.76 - 1.27 mg/dL    GFR calc non Af Amer 101 >59 mL/min/1.73   GFR calc Af Amer 117 >59 mL/min/1.73   BUN/Creatinine Ratio 19 9 - 20   Sodium 137 134 - 144 mmol/L   Potassium 4.7 3.5 - 5.2 mmol/L   Chloride 102 96 - 106 mmol/L   CO2 21 20 - 29 mmol/L   Calcium 9.6 8.7 - 10.2 mg/dL   Total Protein 7.0 6.0 - 8.5 g/dL   Albumin 4.4 3.5 - 5.5 g/dL   Globulin, Total 2.6 1.5 - 4.5 g/dL   Albumin/Globulin Ratio 1.7 1.2 - 2.2   Bilirubin Total 0.3 0.0 - 1.2 mg/dL   Alkaline Phosphatase 67 39 - 117 IU/L   AST 20 0 - 40 IU/L   ALT 39 0 - 44 IU/L  Lipid Panel w/o Chol/HDL Ratio  Result Value Ref Range   Cholesterol, Total 203 (H) 100 - 199 mg/dL   Triglycerides 260 (H) 0 - 149 mg/dL   HDL 46 >39 mg/dL   VLDL Cholesterol Cal 52 (H) 5 - 40 mg/dL   LDL Calculated 105 (H) 0 - 99 mg/dL  Microalbumin, Urine Waived  Result Value Ref Range   Microalb, Ur Waived 80 (H) 0 - 19 mg/L   Creatinine, Urine Waived 300 10 - 300 mg/dL   Microalb/Creat Ratio 30-300 (H) <30 mg/g  TSH  Result Value Ref Range   TSH 2.590 0.450 - 4.500 uIU/mL  UA/M w/rflx Culture, Routine  Result Value Ref Range   Specific Gravity, UA >1.030 (H) 1.005 - 1.030   pH, UA 5.0 5.0 - 7.5   Color, UA Yellow Yellow   Appearance Ur Clear Clear   Leukocytes, UA Negative Negative   Protein, UA Negative Negative/Trace   Glucose, UA Negative Negative   Ketones, UA Trace (A) Negative   RBC, UA Trace (A) Negative   Bilirubin, UA Negative Negative   Urobilinogen, Ur 0.2 0.2 - 1.0 mg/dL   Nitrite, UA Negative Negative   Microscopic Examination See below:   HM DIABETES EYE EXAM  Result Value Ref Range   HM Diabetic Eye Exam No Retinopathy No Retinopathy      Assessment & Plan:   Problem List Items Addressed This Visit      Endocrine   Type 2 diabetes mellitus ([place]) - Primary    Not under good control with A1c of 7.7. We will add jardiance. Continue metformin. Continue to monitor. Recheck 1 month. Call with any concerns.       Relevant  Medications   empagliflozin (JARDIANCE) 25 MG TABS tablet   Other Relevant Orders  Bayer DCA Hb A1c Waived       Follow up plan: Return in about 4 weeks (around 05/24/2017) for follow up DM.

## 2017-04-26 NOTE — Assessment & Plan Note (Signed)
Not under good control with A1c of 7.7. We will add jardiance. Continue metformin. Continue to monitor. Recheck 1 month. Call with any concerns.

## 2017-04-26 NOTE — Patient Instructions (Addendum)
Food Choices to Help Relieve Diarrhea, Adult  When you have diarrhea, the foods you eat and your eating habits are very important. Choosing the right foods and drinks can help:  · Relieve diarrhea.  · Replace lost fluids and nutrients.  · Prevent dehydration.    What general guidelines should I follow?  Relieving diarrhea  · Choose foods with less than 2 g or .07 oz. of fiber per serving.  · Limit fats to less than 8 tsp (38 g or 1.34 oz.) a day.  · Avoid the following:  ? Foods and beverages sweetened with high-fructose corn syrup, honey, or sugar alcohols such as xylitol, sorbitol, and mannitol.  ? Foods that contain a lot of fat or sugar.  ? Fried, greasy, or spicy foods.  ? High-fiber grains, breads, and cereals.  ? Raw fruits and vegetables.  · Eat foods that are rich in probiotics. These foods include dairy products such as yogurt and fermented milk products. They help increase healthy bacteria in the stomach and intestines (gastrointestinal tract, or GI tract).  · If you have lactose intolerance, avoid dairy products. These may make your diarrhea worse.  · Take medicine to help stop diarrhea (antidiarrheal medicine) only as told by your health care provider.  Replacing nutrients  · Eat small meals or snacks every 3–4 hours.  · Eat bland foods, such as white rice, toast, or baked potato, until your diarrhea starts to get better. Gradually reintroduce nutrient-rich foods as tolerated or as told by your health care provider. This includes:  ? Well-cooked protein foods.  ? Peeled, seeded, and soft-cooked fruits and vegetables.  ? Low-fat dairy products.  · Take vitamin and mineral supplements as told by your health care provider.  Preventing dehydration    · Start by sipping water or a special solution to prevent dehydration (oral rehydration solution, ORS). Urine that is clear or pale yellow means that you are getting enough fluid.  · Try to drink at least 8–10 cups of fluid each day to help replace lost  fluids.  · You may add other liquids in addition to water, such as clear juice or decaffeinated sports drinks, as tolerated or as told by your health care provider.  · Avoid drinks with caffeine, such as coffee, tea, or soft drinks.  · Avoid alcohol.  What foods are recommended?  The items listed may not be a complete list. Talk with your health care provider about what dietary choices are best for you.  Grains  White rice. White, French, or pita breads (fresh or toasted), including plain rolls, buns, or bagels. White pasta. Saltine, soda, or graham crackers. Pretzels. Low-fiber cereal. Cooked cereals made with water (such as cornmeal, farina, or cream cereals). Plain muffins. Matzo. Melba toast. Zwieback.  Vegetables  Potatoes (without the skin). Most well-cooked and canned vegetables without skins or seeds. Tender lettuce.  Fruits  Apple sauce. Fruits canned in juice. Cooked apricots, cherries, grapefruit, peaches, pears, or plums. Fresh bananas and cantaloupe.  Meats and other protein foods  Baked or boiled chicken. Eggs. Tofu. Fish. Seafood. Smooth nut butters. Ground or well-cooked tender beef, ham, veal, lamb, pork, or poultry.  Dairy  Plain yogurt, kefir, and unsweetened liquid yogurt. Lactose-free milk, buttermilk, skim milk, or soy milk. Low-fat or nonfat hard cheese.  Beverages  Water. Low-calorie sports drinks. Fruit juices without pulp. Strained tomato and vegetable juices. Decaffeinated teas. Sugar-free beverages not sweetened with sugar alcohols. Oral rehydration solutions, if approved by your health care   provider.  Seasoning and other foods  Bouillon, broth, or soups made from recommended foods.  What foods are not recommended?  The items listed may not be a complete list. Talk with your health care provider about what dietary choices are best for you.  Grains  Whole grain, whole wheat, bran, or rye breads, rolls, pastas, and crackers. Wild or brown rice. Whole grain or bran cereals. Barley. Oats  and oatmeal. Corn tortillas or taco shells. Granola. Popcorn.  Vegetables  Raw vegetables. Fried vegetables. Cabbage, broccoli, Brussels sprouts, artichokes, baked beans, beet greens, corn, kale, legumes, peas, sweet potatoes, and yams. Potato skins. Cooked spinach and cabbage.  Fruits  Dried fruit, including raisins and dates. Raw fruits. Stewed or dried prunes. Canned fruits with syrup.  Meat and other protein foods  Fried or fatty meats. Deli meats. Chunky nut butters. Nuts and seeds. Beans and lentils. Bacon. Hot dogs. Sausage.  Dairy  High-fat cheeses. Whole milk, chocolate milk, and beverages made with milk, such as milk shakes. Half-and-half. Cream. sour cream. Ice cream.  Beverages  Caffeinated beverages (such as coffee, tea, soda, or energy drinks). Alcoholic beverages. Fruit juices with pulp. Prune juice. Soft drinks sweetened with high-fructose corn syrup or sugar alcohols. High-calorie sports drinks.  Fats and oils  Butter. Cream sauces. Margarine. Salad oils. Plain salad dressings. Olives. Avocados. Mayonnaise.  Sweets and desserts  Sweet rolls, doughnuts, and sweet breads. Sugar-free desserts sweetened with sugar alcohols such as xylitol and sorbitol.  Seasoning and other foods  Honey. Hot sauce. Chili powder. Gravy. Cream-based or milk-based soups. Pancakes and waffles.  Summary  · When you have diarrhea, the foods you eat and your eating habits are very important.  · Make sure you get at least 8–10 cups of fluid each day, or enough to keep your urine clear or pale yellow.  · Eat bland foods and gradually reintroduce healthy, nutrient-rich foods as tolerated, or as told by your health care provider.  · Avoid high-fiber, fried, greasy, or spicy foods.  This information is not intended to replace advice given to you by your health care provider. Make sure you discuss any questions you have with your health care provider.  Document Released: 06/27/2003 Document Revised: 04/03/2016 Document  Reviewed: 04/03/2016  Elsevier Interactive Patient Education © 2018 [employer]

## 2017-05-11 ENCOUNTER — Encounter: Payer: Self-pay | Admitting: Family Medicine

## 2017-05-11 NOTE — Telephone Encounter (Signed)
Please call patient and have his appointment moved out 1 month if he's able to start the jardiance, otherwise 3 months after his last appointment for recheck on his sugars. Thanks!

## 2017-05-24 ENCOUNTER — Encounter: Payer: Self-pay | Admitting: Family Medicine

## 2017-05-25 ENCOUNTER — Ambulatory Visit: Payer: BLUE CROSS/BLUE SHIELD | Admitting: Family Medicine

## 2017-05-28 ENCOUNTER — Ambulatory Visit: Payer: BLUE CROSS/BLUE SHIELD | Admitting: Family Medicine

## 2017-07-22 ENCOUNTER — Other Ambulatory Visit: Payer: Self-pay | Admitting: Family Medicine

## 2017-07-22 NOTE — Telephone Encounter (Signed)
escitolopram (Lexapro) refill Last OV: 01/22/17 Last Refill:01/22/17 #90 1 RF Pharmacy:[facility] [address] PCP: Park Liter DO

## 2017-09-17 ENCOUNTER — Encounter: Payer: Self-pay | Admitting: Family Medicine

## 2017-09-17 ENCOUNTER — Ambulatory Visit (INDEPENDENT_AMBULATORY_CARE_PROVIDER_SITE_OTHER): Payer: BLUE CROSS/BLUE SHIELD | Admitting: Family Medicine

## 2017-09-17 VITALS — BP 125/90 | HR 102 | Temp 97.6°F | Wt 237.2 lb

## 2017-09-17 DIAGNOSIS — R197 Diarrhea, unspecified: Secondary | ICD-10-CM | POA: Diagnosis not present

## 2017-09-17 LAB — CBC WITH DIFFERENTIAL/PLATELET
Hematocrit: 55.8 % — ABNORMAL HIGH (ref 37.5–51.0)
Hemoglobin: 19.5 g/dL — ABNORMAL HIGH (ref 13.0–17.7)
LYMPHS: 24 %
Lymphocytes Absolute: 2.7 10*3/uL (ref 0.7–3.1)
MCH: 30.7 pg (ref 26.6–33.0)
MCHC: 34.9 g/dL (ref 31.5–35.7)
MCV: 88 fL (ref 79–97)
MID (Absolute): 1.5 10*3/uL (ref 0.1–1.6)
MID: 13 %
NEUTROS ABS: 7 10*3/uL (ref 1.4–7.0)
NEUTROS PCT: 63 %
PLATELETS: 353 10*3/uL (ref 150–450)
RBC: 6.36 x10E6/uL — ABNORMAL HIGH (ref 4.14–5.80)
RDW: 14.2 % (ref 12.3–15.4)
WBC: 11.2 10*3/uL — AB (ref 3.4–10.8)

## 2017-09-17 MED ORDER — CIPROFLOXACIN HCL 500 MG PO TABS: 500.0000 mg | ORAL_TABLET | Freq: Two times a day (BID) | ORAL | 0 refills | Status: DC

## 2017-09-17 MED ORDER — METRONIDAZOLE 500 MG PO TABS: 500.0000 mg | ORAL_TABLET | Freq: Two times a day (BID) | ORAL | 0 refills | Status: DC

## 2017-09-17 MED ORDER — ALPRAZOLAM 0.5 MG PO TABS: 0.5000 mg | ORAL_TABLET | Freq: Two times a day (BID) | ORAL | 2 refills | Status: DC | PRN

## 2017-09-17 NOTE — Patient Instructions (Signed)
Food Choices to Help Relieve Diarrhea, Adult  When you have diarrhea, the foods you eat and your eating habits are very important. Choosing the right foods and drinks can help:  · Relieve diarrhea.  · Replace lost fluids and nutrients.  · Prevent dehydration.    What general guidelines should I follow?  Relieving diarrhea  · Choose foods with less than 2 g or .07 oz. of fiber per serving.  · Limit fats to less than 8 tsp (38 g or 1.34 oz.) a day.  · Avoid the following:  ? Foods and beverages sweetened with high-fructose corn syrup, honey, or sugar alcohols such as xylitol, sorbitol, and mannitol.  ? Foods that contain a lot of fat or sugar.  ? Fried, greasy, or spicy foods.  ? High-fiber grains, breads, and cereals.  ? Raw fruits and vegetables.  · Eat foods that are rich in probiotics. These foods include dairy products such as yogurt and fermented milk products. They help increase healthy bacteria in the stomach and intestines (gastrointestinal tract, or GI tract).  · If you have lactose intolerance, avoid dairy products. These may make your diarrhea worse.  · Take medicine to help stop diarrhea (antidiarrheal medicine) only as told by your health care provider.  Replacing nutrients  · Eat small meals or snacks every 3–4 hours.  · Eat bland foods, such as white rice, toast, or baked potato, until your diarrhea starts to get better. Gradually reintroduce nutrient-rich foods as tolerated or as told by your health care provider. This includes:  ? Well-cooked protein foods.  ? Peeled, seeded, and soft-cooked fruits and vegetables.  ? Low-fat dairy products.  · Take vitamin and mineral supplements as told by your health care provider.  Preventing dehydration    · Start by sipping water or a special solution to prevent dehydration (oral rehydration solution, ORS). Urine that is clear or pale yellow means that you are getting enough fluid.  · Try to drink at least 8–10 cups of fluid each day to help replace lost  fluids.  · You may add other liquids in addition to water, such as clear juice or decaffeinated sports drinks, as tolerated or as told by your health care provider.  · Avoid drinks with caffeine, such as coffee, tea, or soft drinks.  · Avoid alcohol.  What foods are recommended?  The items listed may not be a complete list. Talk with your health care provider about what dietary choices are best for you.  Grains  White rice. White, French, or pita breads (fresh or toasted), including plain rolls, buns, or bagels. White pasta. Saltine, soda, or graham crackers. Pretzels. Low-fiber cereal. Cooked cereals made with water (such as cornmeal, farina, or cream cereals). Plain muffins. Matzo. Melba toast. Zwieback.  Vegetables  Potatoes (without the skin). Most well-cooked and canned vegetables without skins or seeds. Tender lettuce.  Fruits  Apple sauce. Fruits canned in juice. Cooked apricots, cherries, grapefruit, peaches, pears, or plums. Fresh bananas and cantaloupe.  Meats and other protein foods  Baked or boiled chicken. Eggs. Tofu. Fish. Seafood. Smooth nut butters. Ground or well-cooked tender beef, ham, veal, lamb, pork, or poultry.  Dairy  Plain yogurt, kefir, and unsweetened liquid yogurt. Lactose-free milk, buttermilk, skim milk, or soy milk. Low-fat or nonfat hard cheese.  Beverages  Water. Low-calorie sports drinks. Fruit juices without pulp. Strained tomato and vegetable juices. Decaffeinated teas. Sugar-free beverages not sweetened with sugar alcohols. Oral rehydration solutions, if approved by your health care   provider.  Seasoning and other foods  Bouillon, broth, or soups made from recommended foods.  What foods are not recommended?  The items listed may not be a complete list. Talk with your health care provider about what dietary choices are best for you.  Grains  Whole grain, whole wheat, bran, or rye breads, rolls, pastas, and crackers. Wild or brown rice. Whole grain or bran cereals. Barley. Oats  and oatmeal. Corn tortillas or taco shells. Granola. Popcorn.  Vegetables  Raw vegetables. Fried vegetables. Cabbage, broccoli, Brussels sprouts, artichokes, baked beans, beet greens, corn, kale, legumes, peas, sweet potatoes, and yams. Potato skins. Cooked spinach and cabbage.  Fruits  Dried fruit, including raisins and dates. Raw fruits. Stewed or dried prunes. Canned fruits with syrup.  Meat and other protein foods  Fried or fatty meats. Deli meats. Chunky nut butters. Nuts and seeds. Beans and lentils. Bacon. Hot dogs. Sausage.  Dairy  High-fat cheeses. Whole milk, chocolate milk, and beverages made with milk, such as milk shakes. Half-and-half. Cream. sour cream. Ice cream.  Beverages  Caffeinated beverages (such as coffee, tea, soda, or energy drinks). Alcoholic beverages. Fruit juices with pulp. Prune juice. Soft drinks sweetened with high-fructose corn syrup or sugar alcohols. High-calorie sports drinks.  Fats and oils  Butter. Cream sauces. Margarine. Salad oils. Plain salad dressings. Olives. Avocados. Mayonnaise.  Sweets and desserts  Sweet rolls, doughnuts, and sweet breads. Sugar-free desserts sweetened with sugar alcohols such as xylitol and sorbitol.  Seasoning and other foods  Honey. Hot sauce. Chili powder. Gravy. Cream-based or milk-based soups. Pancakes and waffles.  Summary  · When you have diarrhea, the foods you eat and your eating habits are very important.  · Make sure you get at least 8–10 cups of fluid each day, or enough to keep your urine clear or pale yellow.  · Eat bland foods and gradually reintroduce healthy, nutrient-rich foods as tolerated, or as told by your health care provider.  · Avoid high-fiber, fried, greasy, or spicy foods.  This information is not intended to replace advice given to you by your health care provider. Make sure you discuss any questions you have with your health care provider.  Document Released: 06/27/2003 Document Revised: 04/03/2016 Document  Reviewed: 04/03/2016  Elsevier Interactive Patient Education © 2018 [employer]

## 2017-09-17 NOTE — Progress Notes (Signed)
BP 125/90 (BP Location: Left Arm, Patient Position: Sitting, Cuff Size: Large)   Pulse (!) 102   Temp 97.6 F (36.4 C)   Wt 237 lb 3 oz (107.6 kg)   SpO2 97%   BMI 33.36 kg/m    Subjective:    Patient ID: Glenn Flynn, male    DOB: May 19, 1970, 47 y.o.   MRN: [id number]  HPI: Glenn Flynn is a 47 y.o. male  Chief Complaint  Patient presents with  . Diarrhea    X 4 days, going every couple of hours and abdominal pain   ABDOMINAL ISSUES  Duration: 4 days  Nature: bloating  Location: diffuse  Severity: severe  Radiation: no  Episode duration: better with BM  Frequency: every hour or so, even at night  Treatments attempted: imodium  Constipation: no  Diarrhea: yes  Episodes of diarrhea/day: 12-15  Mucous in the stool: no  Heartburn: yes  Bloating:yes  Flatulence: yes  Nausea: yes  Vomiting: yes  Episodes of vomit/day: 1x only  Melena or hematochezia: no  Rash: no  Jaundice: no  Fever: no  Weight loss: no  Relevant past medical, surgical, family and social history reviewed and updated as indicated. Interim medical history since our last visit reviewed.  Allergies and medications reviewed and updated.  Review of Systems  Constitutional: Negative.  Respiratory: Negative.  Cardiovascular: Negative.  Gastrointestinal: Positive for abdominal distention, diarrhea, nausea and vomiting. Negative for abdominal pain, anal bleeding, blood in stool, constipation and rectal pain.  Musculoskeletal: Negative.  Skin: Negative.  Psychiatric/Behavioral: Negative.  Per HPI unless specifically indicated above   Objective:   BP 125/90 (BP Location: Left Arm, Patient Position: Sitting, Cuff Size: Large)  Pulse (!) 102  Temp 97.6 F (36.4 C)  Wt 237 lb 3 oz (107.6 kg)  SpO2 97%  BMI 33.36 kg/m     Wt Readings from Last 3 Encounters:  09/17/17 237 lb 3 oz (107.6 kg)  04/26/17 255 lb 6 oz (115.8 kg)  02/22/17 259 lb 8 oz (117.7 kg)   Physical Exam    Constitutional: He is oriented to person, place, and time. He appears well-developed and well-nourished. No distress.  HENT:  Head: Normocephalic and atraumatic.  Right Ear: Hearing normal.  Left Ear: Hearing normal.  Nose: Nose normal.  Eyes: Conjunctivae and lids are normal. Right eye exhibits no discharge. Left eye exhibits no discharge. No scleral icterus.  Cardiovascular: Normal rate, regular rhythm, normal heart sounds and intact distal pulses. Exam reveals no gallop and no friction rub.  No murmur heard.  Pulmonary/Chest: Effort normal and breath sounds normal. No stridor. No respiratory distress. He has no wheezes. He has no rales. He exhibits no tenderness.  Abdominal: Soft. He exhibits no distension and no mass. Bowel sounds are increased. There is no tenderness. There is no rebound and no guarding. No hernia.  Musculoskeletal: Normal range of motion.  Neurological: He is alert and oriented to person, place, and time.  Skin: Skin is warm, dry and intact. Capillary refill takes less than 2 seconds. No rash noted. He is not diaphoretic. No erythema. No pallor.  Psychiatric: He has a normal mood and affect. His speech is normal and behavior is normal. Judgment and thought content normal. Cognition and memory are normal.  Nursing note and vitals reviewed.   Results for orders placed or performed in visit on 04/26/17  Bayer DCA Hb A1c Waived  Result Value Ref Range   HB A1C (BAYER DCA -  WAIVED) 7.7 (H) <7.0 %      Assessment & Plan:   Problem List Items Addressed This Visit    None    Visit Diagnoses    Diarrhea, unspecified type    -  Primary   Gentle diet. Fluids. Will check stool studies. WBC slightly elevated. Will treat with cipro and flagyl. Call with any concerns or if not getting better.    Relevant Orders   CBC With Differential/Platelet   Comprehensive metabolic panel   Ova and parasite examination   Stool Culture   Stool C-Diff Toxin Assay   Fecal leukocytes        Follow up plan: Return July for DM visit, if not sooner.Marland Kitchen

## 2017-09-18 LAB — COMPREHENSIVE METABOLIC PANEL
A/G RATIO: 1.7 (ref 1.2–2.2)
ALBUMIN: 4.8 g/dL (ref 3.5–5.5)
ALT: 36 IU/L (ref 0–44)
AST: 11 IU/L (ref 0–40)
Alkaline Phosphatase: 105 IU/L (ref 39–117)
BILIRUBIN TOTAL: 0.9 mg/dL (ref 0.0–1.2)
BUN / CREAT RATIO: 15 (ref 9–20)
BUN: 18 mg/dL (ref 6–24)
CHLORIDE: 98 mmol/L (ref 96–106)
CO2: 21 mmol/L (ref 20–29)
Calcium: 10 mg/dL (ref 8.7–10.2)
Creatinine, Ser: 1.18 mg/dL (ref 0.76–1.27)
GFR calc non Af Amer: 73 mL/min/{1.73_m2} (ref >59)
GFR, EST AFRICAN AMERICAN: 84 mL/min/{1.73_m2} (ref >59)
GLUCOSE: 231 mg/dL — AB (ref 65–99)
Globulin, Total: 2.8 g/dL (ref 1.5–4.5)
POTASSIUM: 4.9 mmol/L (ref 3.5–5.2)
Sodium: 137 mmol/L (ref 134–144)
Total Protein: 7.6 g/dL (ref 6.0–8.5)

## 2017-09-20 ENCOUNTER — Other Ambulatory Visit: Payer: BLUE CROSS/BLUE SHIELD

## 2017-09-20 NOTE — Addendum Note (Signed)
Addended by: Delton Prairie on: 09/20/2017 08:57 AM   Modules accepted: Orders

## 2017-09-22 LAB — CLOSTRIDIUM DIFFICILE EIA: C difficile Toxins A+B, EIA: NEGATIVE

## 2017-09-23 LAB — OVA AND PARASITE EXAMINATION

## 2017-09-27 ENCOUNTER — Other Ambulatory Visit: Payer: Self-pay | Admitting: Family Medicine

## 2017-09-28 NOTE — Telephone Encounter (Signed)
Metformin refill Last Refill:01/22/17 # 360 1 RF Last OV: 04/26/17 PCP: Park Liter DO Pharmacy:[facility] [address] Last Hgb A1C: 04/26/17

## 2017-10-04 ENCOUNTER — Ambulatory Visit: Payer: BLUE CROSS/BLUE SHIELD | Admitting: Family Medicine

## 2017-10-04 LAB — STOOL CULTURE: E COLI SHIGA TOXIN ASSAY: NEGATIVE

## 2017-10-04 LAB — FECAL LEUKOCYTES

## 2017-10-18 ENCOUNTER — Other Ambulatory Visit: Payer: Self-pay | Admitting: Family Medicine

## 2017-12-01 ENCOUNTER — Other Ambulatory Visit: Payer: Self-pay | Admitting: Family Medicine

## 2017-12-25 ENCOUNTER — Other Ambulatory Visit: Payer: Self-pay | Admitting: Family Medicine

## 2018-02-09 ENCOUNTER — Other Ambulatory Visit: Payer: Self-pay | Admitting: Family Medicine

## 2018-02-09 ENCOUNTER — Ambulatory Visit (INDEPENDENT_AMBULATORY_CARE_PROVIDER_SITE_OTHER): Payer: BLUE CROSS/BLUE SHIELD | Admitting: Family Medicine

## 2018-02-09 ENCOUNTER — Encounter: Payer: Self-pay | Admitting: Family Medicine

## 2018-02-09 VITALS — BP 137/89 | HR 68 | Temp 97.8°F | Ht 69.57 in | Wt 248.4 lb

## 2018-02-09 DIAGNOSIS — G4700 Insomnia, unspecified: Secondary | ICD-10-CM | POA: Diagnosis not present

## 2018-02-09 DIAGNOSIS — Z23 Encounter for immunization: Secondary | ICD-10-CM | POA: Diagnosis not present

## 2018-02-09 DIAGNOSIS — E119 Type 2 diabetes mellitus without complications: Secondary | ICD-10-CM

## 2018-02-09 DIAGNOSIS — F411 Generalized anxiety disorder: Secondary | ICD-10-CM

## 2018-02-09 DIAGNOSIS — Z0000 Encounter for general adult medical examination without abnormal findings: Secondary | ICD-10-CM

## 2018-02-09 DIAGNOSIS — Z125 Encounter for screening for malignant neoplasm of prostate: Secondary | ICD-10-CM

## 2018-02-09 DIAGNOSIS — E782 Mixed hyperlipidemia: Secondary | ICD-10-CM | POA: Diagnosis not present

## 2018-02-09 DIAGNOSIS — D229 Melanocytic nevi, unspecified: Secondary | ICD-10-CM

## 2018-02-09 LAB — UA/M W/RFLX CULTURE, ROUTINE
Bilirubin, UA: NEGATIVE
Glucose, UA: NEGATIVE
Ketones, UA: NEGATIVE
LEUKOCYTES UA: NEGATIVE
Nitrite, UA: NEGATIVE
PROTEIN UA: NEGATIVE
RBC, UA: NEGATIVE
Specific Gravity, UA: 1.025 (ref 1.005–1.030)
Urobilinogen, Ur: 0.2 mg/dL (ref 0.2–1.0)
pH, UA: 5 (ref 5.0–7.5)

## 2018-02-09 LAB — MICROALBUMIN, URINE WAIVED
Creatinine, Urine Waived: 200 mg/dL (ref 10–300)
Microalb, Ur Waived: 10 mg/L (ref 0–19)
Microalb/Creat Ratio: <30 mg/g (ref <30)

## 2018-02-09 LAB — BAYER DCA HB A1C WAIVED: HB A1C (BAYER DCA - WAIVED): 7.5 % — ABNORMAL HIGH (ref <7.0)

## 2018-02-09 MED ORDER — METFORMIN HCL ER 500 MG PO TB24: 1000.0000 mg | ORAL_TABLET | Freq: Two times a day (BID) | ORAL | 1 refills | Status: DC

## 2018-02-09 MED ORDER — ERTUGLIFLOZIN L-PYROGLUTAMICAC 5 MG PO TABS: 5.0000 mg | ORAL_TABLET | Freq: Every day | ORAL | 3 refills | Status: DC

## 2018-02-09 MED ORDER — BUPROPION HCL ER (SR) 150 MG PO TB12: 150.0000 mg | ORAL_TABLET | Freq: Two times a day (BID) | ORAL | 1 refills | Status: DC

## 2018-02-09 MED ORDER — ALPRAZOLAM 0.5 MG PO TABS: 0.5000 mg | ORAL_TABLET | Freq: Two times a day (BID) | ORAL | 1 refills | Status: DC | PRN

## 2018-02-09 MED ORDER — TRAZODONE HCL 100 MG PO TABS: ORAL_TABLET | ORAL | 1 refills | Status: DC

## 2018-02-09 NOTE — Telephone Encounter (Signed)
Rx sent for provider review- pharmacy is requesting changes

## 2018-02-09 NOTE — Telephone Encounter (Signed)
I did not write for him to take jardiance. It was too expensive for him. Please make sure they fill the steglatro and use the coupon card given to the patient today.

## 2018-02-09 NOTE — Assessment & Plan Note (Signed)
Has been doing well off the lexapro- will stop it. Continue wellbutrin and PRN xanax. Refill given today. Call with any concerns.

## 2018-02-09 NOTE — Patient Instructions (Signed)

## 2018-02-09 NOTE — Assessment & Plan Note (Signed)
Rechecking levels today. Await results. Call with any concerns.  

## 2018-02-09 NOTE — Telephone Encounter (Signed)
Relayed info to pharmacy

## 2018-02-09 NOTE — Assessment & Plan Note (Signed)
Sugars elevated with A1c of 7.5- will add steglatro and recheck in 3 months. Call with any concerns.

## 2018-02-09 NOTE — Assessment & Plan Note (Signed)
Encouraged diet and exercise. Call with any concerns. Continue to monitor.  

## 2018-02-09 NOTE — Progress Notes (Signed)
BP 137/89 (BP Location: Left Arm, Patient Position: Sitting, Cuff Size: Large)   Pulse 68   Temp 97.8 F (36.6 C)   Ht 5' 9.57" (1.767 m)   Wt 248 lb 6 oz (112.7 kg)   SpO2 98%   BMI 36.08 kg/m    Subjective:    Patient ID: Glenn Flynn, male    DOB: 1970/04/24, 47 y.o.   MRN: [id number]  HPI: Glenn Flynn is a 47 y.o. male presenting on 02/09/2018 for comprehensive medical examination. Current medical complaints include:  DIABETES Hypoglycemic episodes:no Polydipsia/polyuria: no Visual disturbance: no Chest pain: no Paresthesias: no Glucose Monitoring: no  Accucheck frequency: Not Checking Taking Insulin?: no Blood Pressure Monitoring: not checking Retinal Examination: Not up to Date Foot Exam: Done today Diabetic Education: Completed Pneumovax: Up to Date Influenza: Given today Aspirin: no  HYPERLIPIDEMIA Hyperlipidemia status: stable Satisfied with current treatment?  yes Side effects:  Not on anything Supplements: none Aspirin:  no The 10-year ASCVD risk score [name], et al., 2013) is: 7.5%   Values used to calculate the score:     Age: 29 years     Sex: Male     Is Non-Hispanic African American: No     Diabetic: Yes     Tobacco smoker: No     Systolic Blood Pressure: 983 mmHg     Is BP treated: No     HDL Cholesterol: 46 mg/dL     Total Cholesterol: 203 mg/dL Chest pain:  no  ANXIETY/STRESS- has been out of his lexapro in a week, hasn't noticed much of an issue, has been taking xanax occasionally Duration:stable Anxious mood: yes  Excessive worrying: no Irritability: no  Sweating: no Nausea: no Palpitations:no Hyperventilation: no Panic attacks: no Agoraphobia: no  Obscessions/compulsions: no Depressed mood: no Depression screen [facility] 2/9 02/09/2018 02/22/2017 01/22/2017 10/26/2016 01/16/2016  Decreased Interest 1 1 1  0 0  Down, Depressed, Hopeless 1 1 1 1  0  PHQ - 2 Score 2 2 2 1  0  Altered sleeping 2 - 1 1 -  Tired, decreased  energy 1 - 2 1 -  Change in appetite 0 - 0 1 -  Feeling bad or failure about yourself  0 - 1 1 -  Trouble concentrating 1 - 1 0 -  Moving slowly or fidgety/restless 0 - 0 0 -  Suicidal thoughts 0 - 0 0 -  PHQ-9 Score 6 - 7 5 -  Difficult doing work/chores Somewhat difficult - Somewhat difficult - -   Anhedonia: no Weight changes: no Insomnia: no   Hypersomnia: no Fatigue/loss of energy: no Feelings of worthlessness: no Feelings of guilt: no Impaired concentration/indecisiveness: no Suicidal ideations: no  Crying spells: no Recent Stressors/Life Changes: no   Relationship problems: no   Family stress: no     Financial stress: no    Job stress: yes    Recent death/loss: no  Interim Problems from his last visit: no  Depression Screen done today and results listed below:  Depression screen [facility] 2/9 02/09/2018 02/22/2017 01/22/2017 10/26/2016 01/16/2016  Decreased Interest 1 1 1  0 0  Down, Depressed, Hopeless 1 1 1 1  0  PHQ - 2 Score 2 2 2 1  0  Altered sleeping 2 - 1 1 -  Tired, decreased energy 1 - 2 1 -  Change in appetite 0 - 0 1 -  Feeling bad or failure about yourself  0 - 1 1 -  Trouble concentrating 1 - 1  0 -  Moving slowly or fidgety/restless 0 - 0 0 -  Suicidal thoughts 0 - 0 0 -  PHQ-9 Score 6 - 7 5 -  Difficult doing work/chores Somewhat difficult - Somewhat difficult - -    Past Medical History:  Past Medical History:  Diagnosis Date  . Agoraphobia   . Allergy   . Anxiety   . Depression   . Diabetes mellitus without complication ([place])   . Hyperlipidemia   . Migraines     Surgical History:  Past Surgical History:  Procedure Laterality Date  . TONSILLECTOMY  Age 47    Medications:  Current Outpatient Medications on File Prior to Visit  Medication Sig  . Multiple Vitamins-Minerals (MULTIVITAMIN WITH MINERALS) tablet Take 1 tablet by mouth daily.   No current facility-administered medications on file prior to visit.    Allergies:  No Known  Allergies  Social History:  Social History   Socioeconomic History  . Marital status: Single    Spouse name: Not on file  . Number of children: Not on file  . Years of education: Not on file  . Highest education level: Not on file  Occupational History  . Not on file  Social Needs  . Financial resource strain: Not on file  . Food insecurity:    Worry: Not on file    Inability: Not on file  . Transportation needs:    Medical: Not on file    Non-medical: Not on file  Tobacco Use  . Smoking status: Never Smoker  . Smokeless tobacco: Never Used  Substance and Sexual Activity  . Alcohol use: No    Alcohol/week: 0.0 standard drinks    Comment: rare  . Drug use: No  . Sexual activity: Yes  Lifestyle  . Physical activity:    Days per week: Not on file    Minutes per session: Not on file  . Stress: Not on file  Relationships  . Social connections:    Talks on phone: Not on file    Gets together: Not on file    Attends religious service: Not on file    Active member of club or organization: Not on file    Attends meetings of clubs or organizations: Not on file    Relationship status: Not on file  . Intimate partner violence:    Fear of current or ex partner: Not on file    Emotionally abused: Not on file    Physically abused: Not on file    Forced sexual activity: Not on file  Other Topics Concern  . Not on file  Social History Narrative  . Not on file   Social History   Tobacco Use  Smoking Status Never Smoker  Smokeless Tobacco Never Used   Social History   Substance and Sexual Activity  Alcohol Use No  . Alcohol/week: 0.0 standard drinks   Comment: rare    Family History:  Family History  Problem Relation Age of Onset  . Diabetes Mother   . Hypertension Mother   . Diabetes Father   . Hypertension Father   . Heart disease Maternal Grandmother   . Heart disease Maternal Grandfather   . Stroke Paternal Grandfather   . Cancer Neg Hx   . COPD Neg Hx      Past medical history, surgical history, medications, allergies, family history and social history reviewed with patient today and changes made to appropriate areas of the chart.   Review of Systems  Constitutional:  Negative.   HENT: Negative.   Eyes: Negative.   Respiratory: Negative.   Cardiovascular: Negative.   Gastrointestinal: Positive for diarrhea and heartburn. Negative for abdominal pain, blood in stool, constipation, melena, nausea and vomiting.  Genitourinary: Negative.   Musculoskeletal: Negative.   Skin: Positive for rash. Negative for itching.  Endo/Heme/Allergies: Positive for environmental allergies. Negative for polydipsia. Does not bruise/bleed easily.  Psychiatric/Behavioral: Negative for depression, hallucinations, memory loss, substance abuse and suicidal ideas. The patient is nervous/anxious. The patient does not have insomnia.     All other ROS negative except what is listed above and in the HPI.      Objective:    BP 137/89 (BP Location: Left Arm, Patient Position: Sitting, Cuff Size: Large)   Pulse 68   Temp 97.8 F (36.6 C)   Ht 5' 9.57" (1.767 m)   Wt 248 lb 6 oz (112.7 kg)   SpO2 98%   BMI 36.08 kg/m   Wt Readings from Last 3 Encounters:  02/09/18 248 lb 6 oz (112.7 kg)  09/17/17 237 lb 3 oz (107.6 kg)  04/26/17 255 lb 6 oz (115.8 kg)    Physical Exam  Constitutional: He is oriented to person, place, and time. He appears well-developed and well-nourished. No distress.  HENT:  Head: Normocephalic and atraumatic.  Right Ear: Hearing, tympanic membrane, external ear and ear canal normal.  Left Ear: Hearing, tympanic membrane, external ear and ear canal normal.  Nose: Nose normal.  Mouth/Throat: Uvula is midline, oropharynx is clear and moist and mucous membranes are normal. No oropharyngeal exudate.  Eyes: Pupils are equal, round, and reactive to light. Conjunctivae, EOM and lids are normal. Right eye exhibits no discharge. Left eye exhibits no  discharge. No scleral icterus.  Neck: Normal range of motion. Neck supple. No JVD present. No tracheal deviation present. No thyromegaly present.  Cardiovascular: Normal rate, regular rhythm, normal heart sounds and intact distal pulses. Exam reveals no gallop and no friction rub.  No murmur heard. Pulmonary/Chest: Effort normal and breath sounds normal. No stridor. No respiratory distress. He has no wheezes. He has no rales. He exhibits no tenderness.  Abdominal: Soft. Bowel sounds are normal. He exhibits no distension and no mass. There is no tenderness. There is no rebound and no guarding. No hernia. Hernia confirmed negative in the right inguinal area and confirmed negative in the left inguinal area.  Genitourinary: Testes normal and penis normal. Cremasteric reflex is present. Right testis shows no mass, no swelling and no tenderness. Right testis is descended. Cremasteric reflex is not absent on the right side. Left testis shows no mass, no swelling and no tenderness. Left testis is descended. Cremasteric reflex is not absent on the left side. Circumcised. No penile tenderness.  Musculoskeletal: Normal range of motion. He exhibits no edema, tenderness or deformity.  Lymphadenopathy:    He has no cervical adenopathy. No inguinal adenopathy noted on the right or left side.  Neurological: He is alert and oriented to person, place, and time. He displays normal reflexes. No cranial nerve deficit or sensory deficit. He exhibits normal muscle tone. Coordination normal.  Skin: Skin is warm, dry and intact. Capillary refill takes less than 2 seconds. No rash noted. He is not diaphoretic. No erythema. No pallor.  Very fair and freckled, numerous moles  Psychiatric: He has a normal mood and affect. His speech is normal and behavior is normal. Judgment and thought content normal. Cognition and memory are normal.  Nursing note and vitals reviewed.  Genital exam done today with Lesle Chris, CMA in  attendance.  Results for orders placed or performed in visit on 09/17/17  Fecal leukocytes  Result Value Ref Range   White Blood Cells (WBC), Stool CANCELED   Stool C-Diff Toxin Assay  Result Value Ref Range   C difficile Toxins A+B, EIA Negative Negative  Stool Culture  Result Value Ref Range   Salmonella/Shigella Screen Final report    Stool Culture result 1 ([facility]AS[facility]) Comment    Campylobacter Culture Final report    Stool Culture result 1 (CMPCXR) Comment    E coli, Shiga toxin Assay Negative Negative  Ova and parasite examination  Result Value Ref Range   OVA + PARASITE EXAM Final report    O&P result 1 Comment   CBC With Differential/Platelet  Result Value Ref Range   WBC 11.2 (H) 3.4 - 10.8 x10E3/uL   RBC 6.36 (H) 4.14 - 5.80 x10E6/uL   Hemoglobin 19.5 (H) 13.0 - 17.7 g/dL   Hematocrit 55.8 (H) 37.5 - 51.0 %   MCV 88 79 - 97 fL   MCH 30.7 26.6 - 33.0 pg   MCHC 34.9 31.5 - 35.7 g/dL   RDW 14.2 12.3 - 15.4 %   Platelets 353 150 - 450 x10E3/uL   Neutrophils 63 Not Estab. %   Lymphs 24 Not Estab. %   MID 13 Not Estab. %   Neutrophils Absolute 7.0 1.4 - 7.0 x10E3/uL   Lymphocytes Absolute 2.7 0.7 - 3.1 x10E3/uL   MID (Absolute) 1.5 0.1 - 1.6 X10E3/uL  Comprehensive metabolic panel  Result Value Ref Range   Glucose 231 (H) 65 - 99 mg/dL   BUN 18 6 - 24 mg/dL   Creatinine, Ser 1.18 0.76 - 1.27 mg/dL   GFR calc non Af Amer 73 >59 mL/min/1.73   GFR calc Af Amer 84 >59 mL/min/1.73   BUN/Creatinine Ratio 15 9 - 20   Sodium 137 134 - 144 mmol/L   Potassium 4.9 3.5 - 5.2 mmol/L   Chloride 98 96 - 106 mmol/L   CO2 21 20 - 29 mmol/L   Calcium 10.0 8.7 - 10.2 mg/dL   Total Protein 7.6 6.0 - 8.5 g/dL   Albumin 4.8 3.5 - 5.5 g/dL   Globulin, Total 2.8 1.5 - 4.5 g/dL   Albumin/Globulin Ratio 1.7 1.2 - 2.2   Bilirubin Total 0.9 0.0 - 1.2 mg/dL   Alkaline Phosphatase 105 39 - 117 IU/L   AST 11 0 - 40 IU/L   ALT 36 0 - 44 IU/L      Assessment & Plan:   Problem List Items  Addressed This Visit      Endocrine   Type 2 diabetes mellitus ([place])    Sugars elevated with A1c of 7.5- will add steglatro and recheck in 3 months. Call with any concerns.       Relevant Medications   metFORMIN (GLUCOPHAGE-XR) 500 MG 24 hr tablet   Ertugliflozin L-PyroglutamicAc (STEGLATRO) 5 MG TABS   Other Relevant Orders   Bayer DCA Hb A1c Waived   CBC with Differential/Platelet   Comprehensive metabolic panel   Microalbumin, Urine Waived   TSH   UA/M w/rflx Culture, Routine     Other   Hyperlipidemia    Rechecking levels today. Await results. Call with any concerns.       Relevant Orders   CBC with Differential/Platelet   Comprehensive metabolic panel   Lipid Panel w/o Chol/HDL Ratio   TSH   UA/M w/rflx  Culture, Routine   Morbid obesity ([place])    Encouraged diet and exercise. Call with any concerns. Continue to monitor.       Relevant Medications   metFORMIN (GLUCOPHAGE-XR) 500 MG 24 hr tablet   Ertugliflozin L-PyroglutamicAc (STEGLATRO) 5 MG TABS   Anxiety disorder    Has been doing well off the lexapro- will stop it. Continue wellbutrin and PRN xanax. Refill given today. Call with any concerns.       Relevant Medications   traZODone (DESYREL) 100 MG tablet   buPROPion (WELLBUTRIN SR) 150 MG 12 hr tablet   ALPRAZolam (XANAX) 0.5 MG tablet   Other Relevant Orders   CBC with Differential/Platelet   Comprehensive metabolic panel   TSH   UA/M w/rflx Culture, Routine   Insomnia    Under good control on current regimen. Continue current regimen. Continue to monitor. Call with any concerns. Refills given.        Relevant Orders   CBC with Differential/Platelet   Comprehensive metabolic panel   TSH   UA/M w/rflx Culture, Routine    Other Visit Diagnoses    Routine general medical examination at a health care facility    -  Primary   Vaccines up to date. Screening labs checked today. Continue diet and exercise. Call with any concerns.    Relevant Orders    CBC with Differential/Platelet   Comprehensive metabolic panel   TSH   UA/M w/rflx Culture, Routine   Screening for prostate cancer       Labs drawn today. Await results.    Relevant Orders   PSA   Immunization due       Flu shot given today.   Relevant Orders   Flu Vaccine QUAD 6+ mos PF IM (Fluarix Quad PF) (Completed)   Multiple nevi       Would like to see dermatology. Referral generated today.   Relevant Orders   Ambulatory referral to Dermatology       LABORATORY TESTING:  Health maintenance labs ordered today as discussed above.   The natural history of prostate cancer and ongoing controversy regarding screening and potential treatment outcomes of prostate cancer has been discussed with the patient. The meaning of a false positive PSA and a false negative PSA has been discussed. He indicates understanding of the limitations of this screening test and wishes to proceed with screening PSA testing.   IMMUNIZATIONS:   - Tdap: Tetanus vaccination status reviewed: last tetanus booster within 10 years. - Influenza: Administered today - Pneumovax: Up to date  PATIENT COUNSELING:    Sexuality: Discussed sexually transmitted diseases, partner selection, use of condoms, avoidance of unintended pregnancy  and contraceptive alternatives.   Advised to avoid cigarette smoking.  I discussed with the patient that most people either abstain from alcohol or drink within safe limits (<=14/week and <=4 drinks/occasion for males, <=7/weeks and <= 3 drinks/occasion for females) and that the risk for alcohol disorders and other health effects rises proportionally with the number of drinks per week and how often a drinker exceeds daily limits.  Discussed cessation/primary prevention of drug use and availability of treatment for abuse.   Diet: Encouraged to adjust caloric intake to maintain  or achieve ideal body weight, to reduce intake of dietary saturated fat and total fat, to limit sodium  intake by avoiding high sodium foods and not adding table salt, and to maintain adequate dietary potassium and calcium preferably from fresh fruits, vegetables, and low-fat dairy products.  stressed the importance of regular exercise  Injury prevention: Discussed safety belts, safety helmets, smoke detector, smoking near bedding or upholstery.   Dental health: Discussed importance of regular tooth brushing, flossing, and dental visits.   Follow up plan: NEXT PREVENTATIVE PHYSICAL DUE IN 1 YEAR. Return in about 3 months (around 05/12/2018) for DM.

## 2018-02-09 NOTE — Assessment & Plan Note (Signed)
Under good control on current regimen. Continue current regimen. Continue to monitor. Call with any concerns. Refills given.   

## 2018-02-10 LAB — CBC WITH DIFFERENTIAL/PLATELET
Basophils Absolute: 0 10*3/uL (ref 0.0–0.2)
Basos: 0 %
EOS (ABSOLUTE): 0.4 10*3/uL (ref 0.0–0.4)
Eos: 5 %
Hematocrit: 46.5 % (ref 37.5–51.0)
Hemoglobin: 15.5 g/dL (ref 13.0–17.7)
IMMATURE GRANS (ABS): 0 10*3/uL (ref 0.0–0.1)
Immature Granulocytes: 0 %
LYMPHS ABS: 2.7 10*3/uL (ref 0.7–3.1)
Lymphs: 33 %
MCH: 29.7 pg (ref 26.6–33.0)
MCHC: 33.3 g/dL (ref 31.5–35.7)
MCV: 89 fL (ref 79–97)
MONOCYTES: 7 %
MONOS ABS: 0.6 10*3/uL (ref 0.1–0.9)
NEUTROS ABS: 4.4 10*3/uL (ref 1.4–7.0)
Neutrophils: 55 %
PLATELETS: 333 10*3/uL (ref 150–450)
RBC: 5.22 x10E6/uL (ref 4.14–5.80)
RDW: 12.7 % (ref 12.3–15.4)
WBC: 8.1 10*3/uL (ref 3.4–10.8)

## 2018-02-10 LAB — COMPREHENSIVE METABOLIC PANEL
A/G RATIO: 1.9 (ref 1.2–2.2)
ALK PHOS: 65 IU/L (ref 39–117)
ALT: 32 IU/L (ref 0–44)
AST: 16 IU/L (ref 0–40)
Albumin: 4.5 g/dL (ref 3.5–5.5)
BILIRUBIN TOTAL: 0.4 mg/dL (ref 0.0–1.2)
BUN/Creatinine Ratio: 16 (ref 9–20)
BUN: 16 mg/dL (ref 6–24)
CHLORIDE: 102 mmol/L (ref 96–106)
CO2: 25 mmol/L (ref 20–29)
Calcium: 10.1 mg/dL (ref 8.7–10.2)
Creatinine, Ser: 0.97 mg/dL (ref 0.76–1.27)
GFR calc Af Amer: 106 mL/min/{1.73_m2} (ref >59)
GFR calc non Af Amer: 91 mL/min/{1.73_m2} (ref >59)
GLUCOSE: 156 mg/dL — AB (ref 65–99)
Globulin, Total: 2.4 g/dL (ref 1.5–4.5)
POTASSIUM: 5.1 mmol/L (ref 3.5–5.2)
Sodium: 140 mmol/L (ref 134–144)
Total Protein: 6.9 g/dL (ref 6.0–8.5)

## 2018-02-10 LAB — LIPID PANEL W/O CHOL/HDL RATIO
CHOLESTEROL TOTAL: 184 mg/dL (ref 100–199)
HDL: 46 mg/dL (ref >39)
LDL Calculated: 103 mg/dL — ABNORMAL HIGH (ref 0–99)
Triglycerides: 174 mg/dL — ABNORMAL HIGH (ref 0–149)
VLDL Cholesterol Cal: 35 mg/dL (ref 5–40)

## 2018-02-10 LAB — PSA: PROSTATE SPECIFIC AG, SERUM: 0.9 ng/mL (ref 0.0–4.0)

## 2018-02-10 LAB — TSH: TSH: 2.03 u[IU]/mL (ref 0.450–4.500)

## 2018-02-20 ENCOUNTER — Other Ambulatory Visit: Payer: Self-pay

## 2018-02-20 ENCOUNTER — Emergency Department: Payer: BLUE CROSS/BLUE SHIELD

## 2018-02-20 DIAGNOSIS — Z8249 Family history of ischemic heart disease and other diseases of the circulatory system: Secondary | ICD-10-CM | POA: Diagnosis not present

## 2018-02-20 DIAGNOSIS — F419 Anxiety disorder, unspecified: Secondary | ICD-10-CM | POA: Insufficient documentation

## 2018-02-20 DIAGNOSIS — Z7984 Long term (current) use of oral hypoglycemic drugs: Secondary | ICD-10-CM | POA: Insufficient documentation

## 2018-02-20 DIAGNOSIS — F329 Major depressive disorder, single episode, unspecified: Secondary | ICD-10-CM | POA: Diagnosis not present

## 2018-02-20 DIAGNOSIS — E782 Mixed hyperlipidemia: Secondary | ICD-10-CM | POA: Diagnosis not present

## 2018-02-20 DIAGNOSIS — I214 Non-ST elevation (NSTEMI) myocardial infarction: Secondary | ICD-10-CM | POA: Diagnosis not present

## 2018-02-20 DIAGNOSIS — Z79899 Other long term (current) drug therapy: Secondary | ICD-10-CM | POA: Insufficient documentation

## 2018-02-20 DIAGNOSIS — Z6835 Body mass index (BMI) 35.0-35.9, adult: Secondary | ICD-10-CM | POA: Diagnosis not present

## 2018-02-20 DIAGNOSIS — R7989 Other specified abnormal findings of blood chemistry: Secondary | ICD-10-CM | POA: Diagnosis not present

## 2018-02-20 DIAGNOSIS — I4510 Unspecified right bundle-branch block: Secondary | ICD-10-CM | POA: Insufficient documentation

## 2018-02-20 DIAGNOSIS — E119 Type 2 diabetes mellitus without complications: Secondary | ICD-10-CM | POA: Insufficient documentation

## 2018-02-20 DIAGNOSIS — I10 Essential (primary) hypertension: Secondary | ICD-10-CM | POA: Insufficient documentation

## 2018-02-20 DIAGNOSIS — R079 Chest pain, unspecified: Secondary | ICD-10-CM | POA: Diagnosis not present

## 2018-02-20 LAB — BASIC METABOLIC PANEL
Anion gap: 8 (ref 5–15)
BUN: 19 mg/dL (ref 6–20)
CHLORIDE: 103 mmol/L (ref 98–111)
CO2: 27 mmol/L (ref 22–32)
Calcium: 9.8 mg/dL (ref 8.9–10.3)
Creatinine, Ser: 0.92 mg/dL (ref 0.61–1.24)
GFR calc Af Amer: >60 mL/min (ref >60)
GFR calc non Af Amer: >60 mL/min (ref >60)
Glucose, Bld: 163 mg/dL — ABNORMAL HIGH (ref 70–99)
Potassium: 4.1 mmol/L (ref 3.5–5.1)
Sodium: 138 mmol/L (ref 135–145)

## 2018-02-20 LAB — CBC
HEMATOCRIT: 47.1 % (ref 39.0–52.0)
Hemoglobin: 15.4 g/dL (ref 13.0–17.0)
MCH: 29.4 pg (ref 26.0–34.0)
MCHC: 32.7 g/dL (ref 30.0–36.0)
MCV: 89.9 fL (ref 80.0–100.0)
Platelets: 319 10*3/uL (ref 150–400)
RBC: 5.24 MIL/uL (ref 4.22–5.81)
RDW: 12.9 % (ref 11.5–15.5)
WBC: 11.9 10*3/uL — ABNORMAL HIGH (ref 4.0–10.5)
nRBC: 0 % (ref 0.0–0.2)

## 2018-02-20 LAB — TROPONIN I: Troponin I: <0.03 ng/mL (ref <0.03)

## 2018-02-20 NOTE — ED Triage Notes (Signed)
Central CP and jaw pain. A&O, ambulatory. Started 30-45 minutes ago. Hx reflux, doesn't take meds for it anymore. C/o of throat pain.

## 2018-02-21 ENCOUNTER — Encounter
Admission: EM | Disposition: A | Discharge status: Home / Self Care | Payer: Self-pay | Source: Home / Self Care | Attending: Internal Medicine

## 2018-02-21 ENCOUNTER — Observation Stay
Admission: EM | Admit: 2018-02-21 | Discharge: 2018-02-22 | Disposition: A | Discharge status: Home / Self Care | Payer: BLUE CROSS/BLUE SHIELD | Attending: Internal Medicine | Admitting: Internal Medicine

## 2018-02-21 ENCOUNTER — Other Ambulatory Visit: Payer: Self-pay

## 2018-02-21 DIAGNOSIS — F329 Major depressive disorder, single episode, unspecified: Secondary | ICD-10-CM | POA: Diagnosis not present

## 2018-02-21 DIAGNOSIS — I10 Essential (primary) hypertension: Secondary | ICD-10-CM | POA: Diagnosis not present

## 2018-02-21 DIAGNOSIS — R079 Chest pain, unspecified: Secondary | ICD-10-CM | POA: Diagnosis present

## 2018-02-21 DIAGNOSIS — I214 Non-ST elevation (NSTEMI) myocardial infarction: Secondary | ICD-10-CM | POA: Diagnosis not present

## 2018-02-21 DIAGNOSIS — E119 Type 2 diabetes mellitus without complications: Secondary | ICD-10-CM | POA: Diagnosis not present

## 2018-02-21 DIAGNOSIS — I209 Angina pectoris, unspecified: Secondary | ICD-10-CM | POA: Diagnosis present

## 2018-02-21 DIAGNOSIS — E785 Hyperlipidemia, unspecified: Secondary | ICD-10-CM | POA: Diagnosis not present

## 2018-02-21 HISTORY — PX: LEFT HEART CATH AND CORONARY ANGIOGRAPHY: CATH118249

## 2018-02-21 LAB — HEPATIC FUNCTION PANEL
ALBUMIN: 4.4 g/dL (ref 3.5–5.0)
ALT: 44 U/L (ref 0–44)
AST: 26 U/L (ref 15–41)
Alkaline Phosphatase: 52 U/L (ref 38–126)
BILIRUBIN DIRECT: 0.1 mg/dL (ref 0.0–0.2)
BILIRUBIN INDIRECT: 1 mg/dL — AB (ref 0.3–0.9)
BILIRUBIN TOTAL: 1.1 mg/dL (ref 0.3–1.2)
Total Protein: 7.9 g/dL (ref 6.5–8.1)

## 2018-02-21 LAB — GLUCOSE, CAPILLARY: Glucose-Capillary: 135 mg/dL — ABNORMAL HIGH (ref 70–99)

## 2018-02-21 LAB — TSH: TSH: 2.302 u[IU]/mL (ref 0.350–4.500)

## 2018-02-21 LAB — TROPONIN I
Troponin I: 0.06 ng/mL (ref <0.03)
Troponin I: 0.72 ng/mL (ref <0.03)
Troponin I: 1.44 ng/mL (ref <0.03)
Troponin I: 1.41 ng/mL (ref <0.03)

## 2018-02-21 LAB — LIPASE, BLOOD: LIPASE: 33 U/L (ref 11–51)

## 2018-02-21 LAB — HEMOGLOBIN A1C
Hgb A1c MFr Bld: 7.6 % — ABNORMAL HIGH (ref 4.8–5.6)
Mean Plasma Glucose: 171.42 mg/dL

## 2018-02-21 SURGERY — LEFT HEART CATH AND CORONARY ANGIOGRAPHY
Anesthesia: Moderate Sedation

## 2018-02-21 MED ORDER — ONDANSETRON HCL 4 MG/2ML IJ SOLN: 4.0000 mg | Freq: Four times a day (QID) | INTRAMUSCULAR | Status: DC | PRN

## 2018-02-21 MED ORDER — ACETAMINOPHEN 650 MG RE SUPP: 650.0000 mg | Freq: Four times a day (QID) | RECTAL | Status: DC | PRN

## 2018-02-21 MED ORDER — SALINE SPRAY 0.65 % NA SOLN
1.0000 | Freq: Once | NASAL | Status: DC
  Filled 2018-02-21: qty 44

## 2018-02-21 MED ORDER — ALPRAZOLAM 0.5 MG PO TABS
0.5000 mg | ORAL_TABLET | Freq: Two times a day (BID) | ORAL | Status: DC | PRN
  Administered 2018-02-21: 0.5 mg via ORAL
  Filled 2018-02-21: qty 1

## 2018-02-21 MED ORDER — ACETAMINOPHEN 325 MG PO TABS
650.0000 mg | ORAL_TABLET | Freq: Four times a day (QID) | ORAL | Status: DC | PRN
  Administered 2018-02-21: 650 mg via ORAL
  Filled 2018-02-21: qty 2

## 2018-02-21 MED ORDER — MIDAZOLAM HCL 2 MG/2ML IJ SOLN
INTRAMUSCULAR | Status: AC
  Filled 2018-02-21: qty 2

## 2018-02-21 MED ORDER — SODIUM CHLORIDE 0.9% FLUSH: 3.0000 mL | INTRAVENOUS | Status: DC | PRN

## 2018-02-21 MED ORDER — SODIUM CHLORIDE 0.9% FLUSH: 3.0000 mL | Freq: Two times a day (BID) | INTRAVENOUS | Status: DC

## 2018-02-21 MED ORDER — ASPIRIN 81 MG PO CHEW
162.0000 mg | CHEWABLE_TABLET | Freq: Once | ORAL | Status: AC
  Administered 2018-02-21: 162 mg via ORAL
  Filled 2018-02-21: qty 2

## 2018-02-21 MED ORDER — ADULT MULTIVITAMIN W/MINERALS CH
1.0000 | ORAL_TABLET | Freq: Every day | ORAL | Status: DC
  Administered 2018-02-22: 1 via ORAL
  Filled 2018-02-21: qty 1

## 2018-02-21 MED ORDER — MISOPROSTOL 200 MCG PO TABS
200.0000 ug | ORAL_TABLET | Freq: Once | ORAL | Status: AC
  Administered 2018-02-21: 200 ug via ORAL
  Filled 2018-02-21: qty 1

## 2018-02-21 MED ORDER — SUCRALFATE 1 G PO TABS
1.0000 g | ORAL_TABLET | Freq: Once | ORAL | Status: AC
  Administered 2018-02-21: 1 g via ORAL
  Filled 2018-02-21: qty 1

## 2018-02-21 MED ORDER — SODIUM CHLORIDE 0.9 % IV SOLN: 250.0000 mL | INTRAVENOUS | Status: DC | PRN

## 2018-02-21 MED ORDER — ONDANSETRON HCL 4 MG PO TABS: 4.0000 mg | ORAL_TABLET | Freq: Four times a day (QID) | ORAL | Status: DC | PRN

## 2018-02-21 MED ORDER — LORAZEPAM 2 MG/ML IJ SOLN
INTRAMUSCULAR | Status: AC
  Administered 2018-02-21: 2 mg via INTRAVENOUS
  Filled 2018-02-21: qty 1

## 2018-02-21 MED ORDER — HEPARIN (PORCINE) IN NACL 1000-0.9 UT/500ML-% IV SOLN
INTRAVENOUS | Status: AC
  Filled 2018-02-21: qty 1000

## 2018-02-21 MED ORDER — MIDAZOLAM HCL 2 MG/2ML IJ SOLN
INTRAMUSCULAR | Status: DC | PRN
  Administered 2018-02-21: 1 mg via INTRAVENOUS

## 2018-02-21 MED ORDER — IOPAMIDOL (ISOVUE-300) INJECTION 61%
INTRAVENOUS | Status: DC | PRN
  Administered 2018-02-21: 130 mL via INTRAVENOUS

## 2018-02-21 MED ORDER — SODIUM CHLORIDE 0.9 % WEIGHT BASED INFUSION
1.0000 mL/kg/h | INTRAVENOUS | Status: AC
  Administered 2018-02-21 (×2): 1 mL/kg/h via INTRAVENOUS

## 2018-02-21 MED ORDER — SODIUM CHLORIDE 0.9 % WEIGHT BASED INFUSION: 1.0000 mL/kg/h | INTRAVENOUS | Status: DC

## 2018-02-21 MED ORDER — FENTANYL CITRATE (PF) 100 MCG/2ML IJ SOLN
INTRAMUSCULAR | Status: DC | PRN
  Administered 2018-02-21: 50 ug via INTRAVENOUS

## 2018-02-21 MED ORDER — BUPROPION HCL ER (SR) 150 MG PO TB12
150.0000 mg | ORAL_TABLET | Freq: Two times a day (BID) | ORAL | Status: DC
  Administered 2018-02-21 – 2018-02-22 (×2): 150 mg via ORAL
  Filled 2018-02-21 (×4): qty 1

## 2018-02-21 MED ORDER — SODIUM CHLORIDE 0.9 % WEIGHT BASED INFUSION
3.0000 mL/kg/h | INTRAVENOUS | Status: DC
  Administered 2018-02-21: 3 mL/kg/h via INTRAVENOUS

## 2018-02-21 MED ORDER — TRAZODONE HCL 100 MG PO TABS
100.0000 mg | ORAL_TABLET | Freq: Every day | ORAL | Status: DC
  Administered 2018-02-21: 100 mg via ORAL
  Filled 2018-02-21: qty 1

## 2018-02-21 MED ORDER — DOCUSATE SODIUM 100 MG PO CAPS
100.0000 mg | ORAL_CAPSULE | Freq: Two times a day (BID) | ORAL | Status: DC
  Filled 2018-02-21: qty 1

## 2018-02-21 MED ORDER — FENTANYL CITRATE (PF) 100 MCG/2ML IJ SOLN
INTRAMUSCULAR | Status: AC
  Filled 2018-02-21: qty 2

## 2018-02-21 MED ORDER — ENOXAPARIN SODIUM 40 MG/0.4ML ~~LOC~~ SOLN: 40.0000 mg | SUBCUTANEOUS | Status: DC

## 2018-02-21 MED ORDER — LORAZEPAM 2 MG/ML IJ SOLN
2.0000 mg | Freq: Once | INTRAMUSCULAR | Status: AC
  Administered 2018-02-21: 2 mg via INTRAVENOUS

## 2018-02-21 MED ORDER — ASPIRIN 81 MG PO CHEW: 81.0000 mg | CHEWABLE_TABLET | ORAL | Status: DC

## 2018-02-21 MED ORDER — ALUM & MAG HYDROXIDE-SIMETH 200-200-20 MG/5ML PO SUSP
30.0000 mL | Freq: Once | ORAL | Status: AC
  Administered 2018-02-21: 30 mL via ORAL
  Filled 2018-02-21: qty 30

## 2018-02-21 MED ORDER — ACETAMINOPHEN 325 MG PO TABS: 650.0000 mg | ORAL_TABLET | ORAL | Status: DC | PRN

## 2018-02-21 MED ORDER — ERTUGLIFLOZIN L-PYROGLUTAMICAC 5 MG PO TABS: 5.0000 mg | ORAL_TABLET | Freq: Every day | ORAL | Status: DC

## 2018-02-21 SURGICAL SUPPLY — 9 items
CATH INFINITI 5FR ANG PIGTAIL (CATHETERS) ×3 IMPLANT
CATH INFINITI 5FR JL4 (CATHETERS) ×3 IMPLANT
CATH INFINITI JR4 5F (CATHETERS) ×3 IMPLANT
DEVICE CLOSURE MYNXGRIP 5F (Vascular Products) ×3 IMPLANT
KIT MANI 3VAL PERCEP (MISCELLANEOUS) ×3 IMPLANT
NEEDLE PERC 18GX7CM (NEEDLE) ×3 IMPLANT
PACK CARDIAC CATH (CUSTOM PROCEDURE TRAY) ×3 IMPLANT
SHEATH AVANTI 5FR X 11CM (SHEATH) ×3 IMPLANT
WIRE GUIDERIGHT .035X150 (WIRE) ×3 IMPLANT

## 2018-02-21 NOTE — Progress Notes (Signed)
Patient briefly seen and examined the emergency room.  Case discussed with Dr. Jose Persia.  Plan for cardiac catheterization due to chest pain later this afternoon.  Patient should be n.p.o. and discussed this with the patient.

## 2018-02-21 NOTE — Brief Op Note (Signed)
Subjective:  Patient has had improvements of chest discomfort and weakness and fatigue.  Patient has had EKG without changes today with baseline EKG showing normal sinus rhythm with right bundle branch block.  Troponin elevation is 0.72 consistent with non-ST elevation myocardial infarction. Cardiac catheterization showing normal LV systolic function and normal valve with no evidence of coronary atherosclerosis Possible myocarditis or other noncardiac cause this time  Lab Results: Recent Labs    02/20/18 2134  WBC 11.9*  HGB 15.4  PLT 319   Recent Labs    02/20/18 2134  NA 138  K 4.1  CL 103  CO2 27  GLUCOSE 163*  BUN 19  CREATININE 0.92   Recent Labs    02/21/18 0201 02/21/18 1026  TROPONINI 0.06* 0.72*   Hepatic Function Panel Recent Labs    02/20/18 2134  PROT 7.9  ALBUMIN 4.4  AST 26  ALT 44  ALKPHOS 52  BILITOT 1.1  BILIDIR 0.1  IBILI 1.0*   No results for input(s): CHOL in the last 72 hours. No results for input(s): PROTIME in the last 72 hours.     Assessment/Plan:  Chest pain with known cardiovascular risk factors including diabetes essential hypertension mixed hyperlipidemia with elevated troponin consistent with non-ST elevation myocardial infarction although normal coronary arteries normal LV function and normal valvular function by cardiac catheterization  Recommendations No further cardiac prevention at this time due to the catheterization Further evaluation of other possible causes of significant symptoms including pulmonary and/or respiratory issues. Continue follow-up with primary prevention of cardiovascular disease in the future  LOS: 0 days    Corey Skains 02/21/2018, 3:51 PM

## 2018-02-21 NOTE — H&P (Signed)
Glenn Flynn is an 47 y.o. male.   Chief Complaint: Chest pain HPI: The patient with past medical history of diabetes and hyperlipidemia presents to the emergency department complaining of chest pain.  Patient reports that his pain began while moving boxes.  Pain radiated into his left arm and was associated with nausea and shortness of breath.  He describes the pain as pressure.  Patient was found to have elevated troponin prompted the emergency department to call the hospitalist service for further management.  Past Medical History:  Diagnosis Date  . Agoraphobia   . Allergy   . Anxiety   . Depression   . Diabetes mellitus without complication ([place])   . Hyperlipidemia   . Migraines     Past Surgical History:  Procedure Laterality Date  . TONSILLECTOMY  Age 50    Family History  Problem Relation Age of Onset  . Diabetes Mother   . Hypertension Mother   . Diabetes Father   . Hypertension Father   . Heart disease Maternal Grandmother   . Heart disease Maternal Grandfather   . Stroke Paternal Grandfather   . Cancer Neg Hx   . COPD Neg Hx    Social History:  reports that he has never smoked. He has never used smokeless tobacco. He reports that he does not drink alcohol or use drugs.  Allergies: No Known Allergies  Prior to Admission medications   Medication Sig Start Date End Date Taking? Authorizing Provider  ALPRAZolam Duanne Moron) 0.5 MG tablet Take 1 tablet (0.5 mg total) by mouth 2 (two) times daily as needed for anxiety. 02/09/18  Yes Johnson, Megan P, DO  buPROPion (WELLBUTRIN SR) 150 MG 12 hr tablet Take 1 tablet (150 mg total) by mouth 2 (two) times daily. 02/09/18  Yes Johnson, Megan P, DO  Ertugliflozin L-PyroglutamicAc (STEGLATRO) 5 MG TABS Take 5 mg by mouth daily. 02/09/18  Yes Johnson, Megan P, DO  metFORMIN (GLUCOPHAGE-XR) 500 MG 24 hr tablet Take 2 tablets (1,000 mg total) by mouth 2 (two) times daily. 02/09/18  Yes Johnson, Megan P, DO  Multiple  Vitamins-Minerals (MULTIVITAMIN WITH MINERALS) tablet Take 1 tablet by mouth daily.   Yes [provider]  traZODone (DESYREL) 100 MG tablet TAKE 1 TABLET BY MOUTH EVERYDAY AT BEDTIME 02/09/18  Yes Valerie Roys, DO     Results for orders placed or performed during the hospital encounter of 02/21/18 (from the past 48 hour(s))  Basic metabolic panel     Status: Abnormal   Collection Time: 02/20/18  9:34 PM  Result Value Ref Range   Sodium 138 135 - 145 mmol/L   Potassium 4.1 3.5 - 5.1 mmol/L   Chloride 103 98 - 111 mmol/L   CO2 27 22 - 32 mmol/L   Glucose, Bld 163 (H) 70 - 99 mg/dL   BUN 19 6 - 20 mg/dL   Creatinine, Ser 0.92 0.61 - 1.24 mg/dL   Calcium 9.8 8.9 - 10.3 mg/dL   GFR calc non Af Amer >60 >60 mL/min   GFR calc Af Amer >60 >60 mL/min    Comment: (NOTE) The eGFR has been calculated using the CKD EPI equation. This calculation has not been validated in all clinical situations. eGFR's persistently <60 mL/min signify possible Chronic Kidney Disease.    Anion gap 8 5 - 15    Comment: Performed at [facility], [address] [place] [address]  CBC     Status: Abnormal   Collection Time:  02/20/18  9:34 PM  Result Value Ref Range   WBC 11.9 (H) 4.0 - 10.5 K/uL   RBC 5.24 4.22 - 5.81 MIL/uL   Hemoglobin 15.4 13.0 - 17.0 g/dL   HCT 47.1 39.0 - 52.0 %   MCV 89.9 80.0 - 100.0 fL   MCH 29.4 26.0 - 34.0 pg   MCHC 32.7 30.0 - 36.0 g/dL   RDW 12.9 11.5 - 15.5 %   Platelets 319 150 - 400 K/uL   nRBC 0.0 0.0 - 0.2 %    Comment: Performed at [facility], [address] [place], [place] [address]  Troponin I     Status: None   Collection Time: 02/20/18  9:34 PM  Result Value Ref Range   Troponin I <0.03 <0.03 ng/mL    Comment: Performed at [facility], [address] [place], [place] [address]  Hepatic function panel     Status: Abnormal   Collection Time: 02/20/18  9:34 PM  Result Value Ref Range   Total Protein 7.9 6.5  - 8.1 g/dL   Albumin 4.4 3.5 - 5.0 g/dL   AST 26 15 - 41 U/L   ALT 44 0 - 44 U/L   Alkaline Phosphatase 52 38 - 126 U/L   Total Bilirubin 1.1 0.3 - 1.2 mg/dL   Bilirubin, Direct 0.1 0.0 - 0.2 mg/dL   Indirect Bilirubin 1.0 (H) 0.3 - 0.9 mg/dL    Comment: Performed at [facility], [address] [place], [place] [address]  Lipase, blood     Status: None   Collection Time: 02/20/18  9:34 PM  Result Value Ref Range   Lipase 33 11 - 51 U/L    Comment: Performed at [facility], [address] [place], [place] [address]  Troponin I     Status: Abnormal   Collection Time: 02/21/18  2:01 AM  Result Value Ref Range   Troponin I 0.06 (HH) <0.03 ng/mL    Comment: CRITICAL RESULT CALLED TO, READ BACK BY AND VERIFIED WITH VANESSA ASHLEY AT 0230 02/21/18.PMH Performed at [facility], [address] [place], [place] [address]    Dg Chest 2 View  Result Date: 02/20/2018 CLINICAL DATA:  Central chest pain.  Jaw pain. EXAM: CHEST - 2 VIEW COMPARISON:  November 13, 2013 FINDINGS: The heart size and mediastinal contours are within normal limits. Both lungs are clear. The visualized skeletal structures are unremarkable. IMPRESSION: No active cardiopulmonary disease. Electronically Signed   By: Dorise Bullion III M.D   On: 02/20/2018 22:41    Review of Systems  Constitutional: Negative for chills and fever.  HENT: Negative for sore throat and tinnitus.   Eyes: Negative for blurred vision and redness.  Respiratory: Negative for cough and shortness of breath.   Cardiovascular: Positive for chest pain. Negative for palpitations, orthopnea and PND.  Gastrointestinal: Negative for abdominal pain, diarrhea, nausea and vomiting.  Genitourinary: Negative for dysuria, frequency and urgency.  Musculoskeletal: Negative for joint pain and myalgias.  Skin: Negative for rash.       No lesions  Neurological: Negative for speech change, focal weakness and weakness.   Endo/Heme/Allergies: Does not bruise/bleed easily.       No temperature intolerance  Psychiatric/Behavioral: Negative for depression and suicidal ideas.    Blood pressure 135/86, pulse 63, temperature 97.9 F (36.6 C), temperature source Oral, resp. rate 19, height 5' 11"  (1.803 m), weight 112.5 kg, SpO2 97 %. Physical Exam  Vitals reviewed. Constitutional:  He is oriented to person, place, and time. He appears well-developed and well-nourished. No distress.  HENT:  Head: Normocephalic and atraumatic.  Mouth/Throat: Oropharynx is clear and moist.  Eyes: Pupils are equal, round, and reactive to light. Conjunctivae and EOM are normal. No scleral icterus.  Neck: Normal range of motion. Neck supple. No JVD present. No tracheal deviation present. No thyromegaly present.  Cardiovascular: Normal rate and regular rhythm. Exam reveals no friction rub.  No murmur heard. Respiratory: Effort normal and breath sounds normal. He has no wheezes.  GI: Soft. Bowel sounds are normal. He exhibits no distension. There is no tenderness.  Genitourinary:  Genitourinary Comments: Deferred  Musculoskeletal: Normal range of motion. He exhibits no edema.  Lymphadenopathy:    He has no cervical adenopathy.  Neurological: He is alert and oriented to person, place, and time. No cranial nerve deficit.  Skin: Skin is warm and dry. No rash noted. No erythema.  Psychiatric: He has a normal mood and affect. His behavior is normal. Judgment and thought content normal.     Assessment/Plan This is a 47 year old male admitted for chest pain. 1.  Chest pain: Continue to follow cardiac biomarkers.  Monitor telemetry.  Consult cardiology. 2.  Diabetes mellitus type 2: Hold metformin and Steglatro for now; sliding scale insulin while hospitalized 3.  Hyperlipidemia: Continue statin therapy 4.  DVT prophylaxis: [place] 5.  GI prophylaxis: None The patient is a full code.  Time spent on admission orders and patient care  approximately 45 minutes  Harrie Foreman, MD 02/21/2018, 8:01 AM

## 2018-02-21 NOTE — Progress Notes (Signed)
Patient clinically stable post cardiac cath per Dr Nehemiah Massed. Vitals stable. Right groin without bleeding nor hematoma. Eating supper. Family present at bedside. Sinus rhythm per monitor.report called to Oren Beckmann on telemetry with questions answered, plan reviewed. Pt denies complaints at this time.

## 2018-02-21 NOTE — Consult Note (Signed)
[facility] Consultation Note  Patient ID: Author Hatlestad, MRN: [id number], DOB/AGE: 07-07-1970 47 y.o. Admit date: 02/21/2018   Date of Consult: 02/21/2018 Primary Physician: Valerie Roys, DO Primary Cardiologist: None  Chief Complaint:  Chief Complaint  Patient presents with  . Chest Pain   Reason for Consult: Chest pain  HPI: 47 y.o. male with known essential hypertension mixed hyperlipidemia and diabetes deviously watched and not used to with medication management and significant family history of cardiovascular disease who has had episode of chest pain.  Patient has not been a smoker in the past and has been fairly active but recently had an episode of chest discomfort radiating into his back neck jaw and arms while walking up the steps with groceries.  He immediately stopped and waited for the next hour to have relief but had's continued mild chest discomfort and shortness of breath.  The patient was seen in the emergency room at which time he had a normal EKG and no evidence of myocardial infarction with troponin of 0.06.  Currently he has mild amount of chest pressure but slowly relieved with appropriate medication management.  Past Medical History:  Diagnosis Date  . Agoraphobia   . Allergy   . Anxiety   . Depression   . Diabetes mellitus without complication ([place])   . Hyperlipidemia   . Migraines       Surgical History:  Past Surgical History:  Procedure Laterality Date  . TONSILLECTOMY  Age 73     Home Meds: Prior to Admission medications   Medication Sig Start Date End Date Taking? Authorizing Provider  ALPRAZolam Duanne Moron) 0.5 MG tablet Take 1 tablet (0.5 mg total) by mouth 2 (two) times daily as needed for anxiety. 02/09/18  Yes Johnson, Megan P, DO  buPROPion (WELLBUTRIN SR) 150 MG 12 hr tablet Take 1 tablet (150 mg total) by mouth 2 (two) times daily. 02/09/18  Yes Johnson, Megan P, DO  Ertugliflozin L-PyroglutamicAc (STEGLATRO) 5 MG TABS Take  5 mg by mouth daily. 02/09/18  Yes Johnson, Megan P, DO  metFORMIN (GLUCOPHAGE-XR) 500 MG 24 hr tablet Take 2 tablets (1,000 mg total) by mouth 2 (two) times daily. 02/09/18  Yes Johnson, Megan P, DO  Multiple Vitamins-Minerals (MULTIVITAMIN WITH MINERALS) tablet Take 1 tablet by mouth daily.   Yes [provider]  traZODone (DESYREL) 100 MG tablet TAKE 1 TABLET BY MOUTH EVERYDAY AT BEDTIME 02/09/18  Yes Valerie Roys, DO    Inpatient Medications:     Allergies: No Known Allergies  Social History   Socioeconomic History  . Marital status: Single    Spouse name: Not on file  . Number of children: Not on file  . Years of education: Not on file  . Highest education level: Not on file  Occupational History  . Not on file  Social Needs  . Financial resource strain: Not on file  . Food insecurity:    Worry: Not on file    Inability: Not on file  . Transportation needs:    Medical: Not on file    Non-medical: Not on file  Tobacco Use  . Smoking status: Never Smoker  . Smokeless tobacco: Never Used  Substance and Sexual Activity  . Alcohol use: No    Alcohol/week: 0.0 standard drinks    Comment: rare  . Drug use: No  . Sexual activity: Yes  Lifestyle  . Physical activity:    Days per week: Not on file  Minutes per session: Not on file  . Stress: Not on file  Relationships  . Social connections:    Talks on phone: Not on file    Gets together: Not on file    Attends religious service: Not on file    Active member of club or organization: Not on file    Attends meetings of clubs or organizations: Not on file    Relationship status: Not on file  . Intimate partner violence:    Fear of current or ex partner: Not on file    Emotionally abused: Not on file    Physically abused: Not on file    Forced sexual activity: Not on file  Other Topics Concern  . Not on file  Social History Narrative  . Not on file     Family History  Problem Relation Age of Onset   . Diabetes Mother   . Hypertension Mother   . Diabetes Father   . Hypertension Father   . Heart disease Maternal Grandmother   . Heart disease Maternal Grandfather   . Stroke Paternal Grandfather   . Cancer Neg Hx   . COPD Neg Hx      Review of Systems Positive for chest pain Negative for: General:  chills, fever, night sweats or weight changes.  Cardiovascular: PND orthopnea syncope dizziness  Dermatological skin lesions rashes Respiratory: Cough congestion Urologic: Frequent urination urination at night and hematuria Abdominal: negative for nausea, vomiting, diarrhea, bright red blood per rectum, melena, or hematemesis Neurologic: negative for visual changes, and/or hearing changes  All other systems reviewed and are otherwise negative except as noted above.  Labs: Recent Labs    02/20/18 2134 02/21/18 0201  TROPONINI <0.03 0.06*   Lab Results  Component Value Date   WBC 11.9 (H) 02/20/2018   HGB 15.4 02/20/2018   HCT 47.1 02/20/2018   MCV 89.9 02/20/2018   PLT 319 02/20/2018    Recent Labs  Lab 02/20/18 2134  NA 138  K 4.1  CL 103  CO2 27  BUN 19  CREATININE 0.92  CALCIUM 9.8  PROT 7.9  BILITOT 1.1  ALKPHOS 52  ALT 44  AST 26  GLUCOSE 163*   Lab Results  Component Value Date   CHOL 184 02/09/2018   HDL 46 02/09/2018   LDLCALC 103 (H) 02/09/2018   TRIG 174 (H) 02/09/2018   No results found for: DDIMER  Radiology/Studies:  Dg Chest 2 View  Result Date: 02/20/2018 CLINICAL DATA:  Central chest pain.  Jaw pain. EXAM: CHEST - 2 VIEW COMPARISON:  November 13, 2013 FINDINGS: The heart size and mediastinal contours are within normal limits. Both lungs are clear. The visualized skeletal structures are unremarkable. IMPRESSION: No active cardiopulmonary disease. Electronically Signed   By: Dorise Bullion III M.D   On: 02/20/2018 22:41    EKG: Normal sinus rhythm  Weights: Filed Weights   02/20/18 2132  Weight: 112.5 kg     Physical Exam: Blood  pressure 135/86, pulse 63, temperature 97.9 F (36.6 C), temperature source Oral, resp. rate 19, height 5\' 11"  (1.803 m), weight 112.5 kg, SpO2 97 %. Body mass index is 34.59 kg/m. General: Well developed, well nourished, in no acute distress. Head eyes ears nose throat: Normocephalic, atraumatic, sclera non-icteric, no xanthomas, nares are without discharge. No apparent thyromegaly and/or mass  Lungs: Normal respiratory effort.  no wheezes, no rales, no rhonchi.  Heart: RRR with normal S1 S2. no murmur gallop, no rub, PMI is normal  size and placement, carotid upstroke normal without bruit, jugular venous pressure is normal Abdomen: Soft, non-tender, non-distended with normoactive bowel sounds. No hepatomegaly. No rebound/guarding. No obvious abdominal masses. Abdominal aorta is normal size without bruit Extremities: No edema. no cyanosis, no clubbing, no ulcers  Peripheral : 2+ bilateral upper extremity pulses, 2+ bilateral femoral pulses, 2+ bilateral dorsal pedal pulse Neuro: Alert and oriented. No facial asymmetry. No focal deficit. Moves all extremities spontaneously. Musculoskeletal: Normal muscle tone without kyphosis Psych:  Responds to questions appropriately with a normal affect.    Assessment: 47 year old male with essential hypertension mixed hyperlipidemia diabetes with acute prominent significant symptoms of cardiovascular disease and angina with an elevated troponin consistent with non-ST elevation myocardial infarction  Plan: 1.  Aspirin nitrates for further risk reduction and myocardial infarction and chest discomfort 2.  Heparin for further risk reduction cardiovascular event 3.  Proceed to cardiac catheterization to assess coronary anatomy and further treatment thereof is necessary.  Patient understands risk and benefits of cardiac catheterization.  This includes a possibility death stroke heart attack infection bleeding or blood clot.  The patient is at low risk for conscious  sedation  Signed, Corey Skains M.D. [facility] 02/21/2018, 8:32 AM

## 2018-02-21 NOTE — ED Provider Notes (Signed)
[facility] Emergency Department Provider Note  ____________________________________________   First MD Initiated Contact with Patient 02/21/18 0036     (approximate)  I have reviewed the triage vital signs and the nursing notes.   HISTORY  Chief Complaint Chest Pain   HPI Glenn Flynn is a 47 y.o. male who comes to the emergency department with substernal  pressure-like upper chest pain radiating to his bilateral jaw and left arm that began about half hour prior to arrival.  He was moving heavy boxes and carrying them up steps when he felt the pain and it was unlike any pain he is ever had before.  Was associated with some nausea and shortness of breath.  He has a past medical history of diabetes mellitus as well as dyslipidemia and gastric reflux.  He takes metformin as well as Wellbutrin and occasional alprazolam.  He is never had heart issues before and has never seen a cardiologist.  His symptoms came on gradually were exertional and improved with rest.  There were moderate severity and described as "pressure".  There were not ripping or tearing and did not go straight to his back.   Past Medical History:  Diagnosis Date  . Agoraphobia   . Allergy   . Anxiety   . Depression   . Diabetes mellitus without complication ([place])   . Hyperlipidemia   . Migraines     Patient Active Problem List   Diagnosis Date Noted  . Chest pain 02/21/2018  . Insomnia 07/04/2015  . Controlled substance agreement signed 07/04/2015  . Fever blister 01/03/2015  . Anxiety disorder 01/03/2015  . Hyperlipidemia 10/04/2014  . Type 2 diabetes mellitus ([place]) 10/04/2014  . Morbid obesity ([place]) 10/04/2014    Past Surgical History:  Procedure Laterality Date  . TONSILLECTOMY  Age 52    Prior to Admission medications   Medication Sig Start Date End Date Taking? Authorizing Provider  ALPRAZolam Duanne Moron) 0.5 MG tablet Take 1 tablet (0.5 mg total) by mouth 2 (two) times  daily as needed for anxiety. 02/09/18  Yes Johnson, Megan P, DO  buPROPion (WELLBUTRIN SR) 150 MG 12 hr tablet Take 1 tablet (150 mg total) by mouth 2 (two) times daily. 02/09/18  Yes Johnson, Megan P, DO  Ertugliflozin L-PyroglutamicAc (STEGLATRO) 5 MG TABS Take 5 mg by mouth daily. 02/09/18  Yes Johnson, Megan P, DO  metFORMIN (GLUCOPHAGE-XR) 500 MG 24 hr tablet Take 2 tablets (1,000 mg total) by mouth 2 (two) times daily. 02/09/18  Yes Johnson, Megan P, DO  Multiple Vitamins-Minerals (MULTIVITAMIN WITH MINERALS) tablet Take 1 tablet by mouth daily.   Yes [provider]  traZODone (DESYREL) 100 MG tablet TAKE 1 TABLET BY MOUTH EVERYDAY AT BEDTIME 02/09/18  Yes Johnson, Megan P, DO    Allergies Patient has no known allergies.  Family History  Problem Relation Age of Onset  . Diabetes Mother   . Hypertension Mother   . Diabetes Father   . Hypertension Father   . Heart disease Maternal Grandmother   . Heart disease Maternal Grandfather   . Stroke Paternal Grandfather   . Cancer Neg Hx   . COPD Neg Hx     Social History Social History   Tobacco Use  . Smoking status: Never Smoker  . Smokeless tobacco: Never Used  Substance Use Topics  . Alcohol use: No    Alcohol/week: 0.0 standard drinks    Comment: rare  . Drug use: No    Review of  Systems Constitutional: No fever/chills Eyes: No visual changes. ENT: No sore throat. Cardiovascular: Positive for chest pain. Respiratory: Positive for shortness of breath. Gastrointestinal: No abdominal pain.  Positive for nausea, no vomiting.  No diarrhea.  No constipation. Genitourinary: Negative for dysuria. Musculoskeletal: Negative for back pain. Skin: Negative for rash. Neurological: Negative for headaches, focal weakness or numbness.   ____________________________________________   PHYSICAL EXAM:  VITAL SIGNS: ED Triage Vitals [02/20/18 2132]  Enc Vitals Group     BP (!) 147/75     Pulse Rate (!) 52     Resp 18      Temp 97.9 F (36.6 C)     Temp Source Oral     SpO2 100 %     Weight 248 lb (112.5 kg)     Height 5\' 11"  (1.803 m)     Head Circumference      Peak Flow      Pain Score 7     Pain Loc      Pain Edu?      Excl. in [place]?     Constitutional: Alert and oriented x4 uncomfortable appearing although nontoxic no diaphoresis Eyes: PERRL EOMI. Head: Atraumatic. Nose: No congestion/rhinnorhea. Mouth/Throat: No trismus Neck: No stridor.  Able to completely flat with no JVD Cardiovascular: Normal rate, regular rhythm. Grossly normal heart sounds.  Good peripheral circulation. Respiratory: Normal respiratory effort.  No retractions. Lungs CTAB and moving good air Gastrointestinal: Soft nontender Musculoskeletal: No lower extremity edema legs equal in size Neurologic:  Normal speech and language. No gross focal neurologic deficits are appreciated. Skin:  Skin is warm, dry and intact. No rash noted. Psychiatric: Mood and affect are normal. Speech and behavior are normal.    ____________________________________________   DIFFERENTIAL includes but not limited to  STEMI, non-STEMI, unstable angina, stable angina, gastritis, gastric reflux, pancreatitis ____________________________________________   LABS (all labs ordered are listed, but only abnormal results are displayed)  Labs Reviewed  BASIC METABOLIC PANEL - Abnormal; Notable for the following components:      Result Value   Glucose, Bld 163 (*)    All other components within normal limits  CBC - Abnormal; Notable for the following components:   WBC 11.9 (*)    All other components within normal limits  HEPATIC FUNCTION PANEL - Abnormal; Notable for the following components:   Indirect Bilirubin 1.0 (*)    All other components within normal limits  TROPONIN I - Abnormal; Notable for the following components:   Troponin I 0.06 (*)    All other components within normal limits  TROPONIN I  LIPASE, BLOOD    Lab work reviewed  by me shows first troponin was negative and second troponin was positive __________________________________________  EKG  ED ECG REPORT I, Darel Hong, the attending physician, personally viewed and interpreted this ECG.  Date: 02/21/2018 EKG Time:  Rate: 65 Rhythm: normal sinus rhythm QRS Axis: normal Intervals: normal ST/T Wave abnormalities: normal Narrative Interpretation: no evidence of acute ischemia  ____________________________________________  RADIOLOGY  Chest x-ray reviewed by me with no acute disease ____________________________________________   PROCEDURES  Procedure(s) performed: no  .Critical Care Performed by: Darel Hong, MD Authorized by: Darel Hong, MD   Critical care provider statement:    Critical care time (minutes):  30   Critical care time was exclusive of:  Separately billable procedures and treating other patients   Critical care was necessary to treat or prevent imminent or life-threatening deterioration of the following conditions:  Cardiac  failure   Critical care was time spent personally by me on the following activities:  Development of treatment plan with patient or surrogate, discussions with consultants, evaluation of patient's response to treatment, examination of patient, obtaining history from patient or surrogate, ordering and performing treatments and interventions, ordering and review of laboratory studies, ordering and review of radiographic studies, pulse oximetry, re-evaluation of patient's condition and review of old charts    Critical Care performed: Yes  ____________________________________________   INITIAL IMPRESSION / ASSESSMENT AND PLAN / ED COURSE  Pertinent labs & imaging results that were available during my care of the patient were reviewed by me and considered in my medical decision making (see chart for details).   As part of my medical decision making, I reviewed the following data within the  [facility] History obtained from family if available, nursing notes, old chart and ekg, as well as notes from prior ED visits.  By the time I saw the patient he had already had blood work which was unremarkable.  He does have a history of gastric disease so I gave him a trial of Maalox Cytotec and Carafate which did not improve his symptoms whatsoever.  His pain was actually resolved by the time he got to my room.  He arrived in the emergency department 30 to 45 minutes after onset of his symptoms and given his history of diabetes and dyslipidemia along with a concerning story I do think he requires 162 mg of aspirin and a second troponin.  The patient second troponin is negative ruling him in for non-STEMI.  He has no ongoing pain.  At this point he requires inpatient admission for full cardiac risk stratification given his active ischemic heart attack.  I discussed with the hospitalist Dr. Marcille Blanco who has graciously agreed to admit the patient to his service.      ____________________________________________   FINAL CLINICAL IMPRESSION(S) / ED DIAGNOSES  Final diagnoses:  NSTEMI (non-ST elevated myocardial infarction) ([place])      NEW MEDICATIONS STARTED DURING THIS VISIT:  New Prescriptions   No medications on file     Note:  This document was prepared using Dragon voice recognition software and may include unintentional dictation errors.     Darel Hong, MD 02/21/18 [phone]

## 2018-02-21 NOTE — ED Notes (Signed)
Cardiologist at bedside with patient. 

## 2018-02-21 NOTE — ED Notes (Signed)
Patient placed in hospital bed. Family at bedside with patient. Updated on plan of care.

## 2018-02-21 NOTE — Progress Notes (Signed)
47 yo wm admitted from cath lab via stretcher s/p heart cath.  Rt groin site dry and intact. Pulses equal bil. SL rt ac flushes well.  IVF infusing rt hand.  Lungs clear bil  Cardiac monitor applied and verified with Myriam Jacobson, CNA.  Skin intact.  Oriented to room and surroundings, POC reviewed with patient and wife. CB in reach, SR up x 2.

## 2018-02-21 NOTE — Plan of Care (Signed)
  Problem: Clinical Measurements: Goal: Will remain free from infection Outcome: Progressing Note:  Remains afebrile   Problem: Cardiac: Goal: Ability to achieve and maintain adequate cardiovascular perfusion will improve Outcome: Progressing Note:  Heart cath today, showed clean coronaires   Problem: Clinical Measurements: Goal: Diagnostic test results will improve Outcome: Not Progressing Note:  Troponins trended up .03, .06, .72

## 2018-02-22 ENCOUNTER — Telehealth: Payer: Self-pay

## 2018-02-22 ENCOUNTER — Encounter: Payer: BLUE CROSS/BLUE SHIELD | Admitting: Family Medicine

## 2018-02-22 ENCOUNTER — Encounter: Payer: Self-pay | Admitting: *Deleted

## 2018-02-22 DIAGNOSIS — R079 Chest pain, unspecified: Secondary | ICD-10-CM | POA: Diagnosis not present

## 2018-02-22 DIAGNOSIS — E785 Hyperlipidemia, unspecified: Secondary | ICD-10-CM | POA: Diagnosis not present

## 2018-02-22 DIAGNOSIS — E119 Type 2 diabetes mellitus without complications: Secondary | ICD-10-CM | POA: Diagnosis not present

## 2018-02-22 DIAGNOSIS — F329 Major depressive disorder, single episode, unspecified: Secondary | ICD-10-CM | POA: Diagnosis not present

## 2018-02-22 NOTE — Plan of Care (Signed)
  Problem: Clinical Measurements: Goal: Ability to maintain clinical measurements within normal limits will improve Outcome: Progressing Goal: Diagnostic test results will improve Outcome: Progressing Goal: Cardiovascular complication will be avoided Outcome: Progressing   Problem: Activity: Goal: Risk for activity intolerance will decrease Outcome: Progressing Note:  Pt up in room independently, tolerating well   Problem: Coping: Goal: Level of anxiety will decrease Outcome: Progressing   Problem: Elimination: Goal: Will not experience complications related to bowel motility Outcome: Progressing Goal: Will not experience complications related to urinary retention Outcome: Progressing   Problem: Pain Managment: Goal: General experience of comfort will improve Outcome: Progressing Note:  Complaints of sinus pressure, treated with tylenol once, which gave some relief   Problem: Safety: Goal: Ability to remain free from injury will improve Outcome: Progressing   Problem: Skin Integrity: Goal: Risk for impaired skin integrity will decrease Outcome: Progressing   Problem: Education: Goal: Knowledge of General Education information will improve Description Including pain rating scale, medication(s)/side effects and non-pharmacologic comfort measures Outcome: Completed/Met   Problem: Clinical Measurements: Goal: Will remain free from infection Outcome: Completed/Met   Problem: Nutrition: Goal: Adequate nutrition will be maintained Outcome: Completed/Met   Problem: Clinical Measurements: Goal: Respiratory complications will improve Outcome: Not Applicable

## 2018-02-22 NOTE — Progress Notes (Signed)
Cardiovascular and Pulmonary Nurse Navigator Note:  In reviewing patient's chart patient is not a candidate for Cardiac Rehab as the documentation in the chart indicated a clean cath with no evidence of atherosclerosis. Dr. Nehemiah Massed further documented in his note the troponin elevation was due to possible myocarditis or other non-cardiac cause.  The Cardiac Rehab referral has now been cancelled.  Dr. Nehemiah Massed paged and informed of the above.      Roanna Epley, RN, BSN, [facility]  Cardiovascular & Pulmonary Nurse Navigator  Direct Line: [phone]  Department Phone #: [phone] Fax: [phone]  Email Address: [email or web address]

## 2018-02-22 NOTE — Discharge Instructions (Signed)
Myocarditis, Adult Myocarditis is a swelling (inflammation) of the heart muscle (myocardium). When the heart becomes inflamed, it cannot pump as well. Severe cases of myocarditis can cause heart failure. What are the causes? It is usually caused by a viral infection, although other causes are possible. What are the signs or symptoms? Mild cases of myocarditis may not have symptoms. If symptoms do occur, they may include:  Chest pain.  Shortness of breath.  Fast or abnormal heart rhythms.  Fatigue.  Fluid retention or swelling in the feet or legs.  Fever.  Body aches.  Fainting.  How is this diagnosed? Myocarditis can be hard to diagnose because it can mimic other diseases. Myocarditis may be suspected if the above symptoms have appeared after a recent infection. A physical exam and other tests may be used to confirm the diagnosis. Some of these tests are:  Blood tests to check for signs of infection or heart damage.  Electrocardiography that shows your heart's electrical patterns and rhythms.  A chest X-ray exam to look at your heart and lungs.  Echocardiography or other imaging tests to look at how well your heart is working.  Your health care provider also may recommend a heart (cardiac) catheterization. In this test, a flexible tube (catheter) is inserted into a blood vessel then threaded into the heart. A special instrument can then remove tiny samples of heart muscle tissue (biopsy). The samples are sent to a lab for analysis.  How is this treated? Treatment of myocarditis is aimed at treating the underlying cause and may involve:  Heart medicine such as beta blockers or angiotensin-converting enzyme (ACE) inhibitors. These help strengthen the heart and help it beat more regularly.  Diuretic medicine. Extra fluid in the body can make the heart work harder. Diuretic medicine can help get rid of the extra fluid.  Steroid medicine. In some cases of myocarditis, steroid  medicine is used to reduce swelling.  This information is not intended to replace advice given to you by your health care provider. Make sure you discuss any questions you have with your health care provider. Document Released: 02/26/2005 Document Revised: 09/12/2015 Document Reviewed: 08/29/2012 Elsevier Interactive Patient Education  2017 [employer].

## 2018-02-22 NOTE — Telephone Encounter (Signed)
Lmov for patient to call and schedule ED fu appointment °They were seen on 02/20/18 for CP °

## 2018-02-23 ENCOUNTER — Encounter: Payer: Self-pay | Admitting: Family Medicine

## 2018-02-24 NOTE — Discharge Summary (Signed)
[facility] [place] NAME: Joel Cowin    MR#:  [id number]  DATE OF BIRTH:  [birth year]  DATE OF ADMISSION:  02/21/2018   ADMITTING PHYSICIAN: Harrie Foreman, MD  DATE OF DISCHARGE: 02/22/2018  2:59 PM  PRIMARY CARE PHYSICIAN: Valerie Roys, DO   ADMISSION DIAGNOSIS:  NSTEMI (non-ST elevated myocardial infarction) ([place]) [I21.4] DISCHARGE DIAGNOSIS:  Active Problems:   Chest pain  SECONDARY DIAGNOSIS:   Past Medical History:  Diagnosis Date  . Agoraphobia   . Allergy   . Anxiety   . Depression   . Diabetes mellitus without complication ([place])   . Hyperlipidemia   . Migraines    HOSPITAL COURSE:  This is a 47 year old male admitted for chest pain. 1.  Chest pain: Patient has had EKG without acute changes with baseline EKG showing normal sinus rhythm with right bundle branch block.  Troponin elevation is likely due to viral myocarditis. Cardiac catheterization showing normal LV systolic function and normal valve with no evidence of coronary atherosclerosis 2.  Diabetes mellitus type 2: controlled on home meds 3.  Hyperlipidemia: Continue statin therapy DISCHARGE CONDITIONS:  stable CONSULTS OBTAINED:  Treatment Team:  Corey Skains, MD DRUG ALLERGIES:  No Known Allergies DISCHARGE MEDICATIONS:   Allergies as of 02/22/2018   No Known Allergies     Medication List    TAKE these medications   ALPRAZolam 0.5 MG tablet Commonly known as:  XANAX Take 1 tablet (0.5 mg total) by mouth 2 (two) times daily as needed for anxiety.   buPROPion 150 MG 12 hr tablet Commonly known as:  WELLBUTRIN SR Take 1 tablet (150 mg total) by mouth 2 (two) times daily.   Ertugliflozin L-PyroglutamicAc 5 MG Tabs Take 5 mg by mouth daily.   metFORMIN 500 MG 24 hr tablet Commonly known as:  GLUCOPHAGE-XR Take 2 tablets (1,000 mg total) by mouth 2 (two) times daily.   multivitamin with minerals tablet Take 1 tablet by mouth  daily.   traZODone 100 MG tablet Commonly known as:  DESYREL TAKE 1 TABLET BY MOUTH EVERYDAY AT BEDTIME        DISCHARGE INSTRUCTIONS:   DIET:  Regular diet DISCHARGE CONDITION:  Good ACTIVITY:  Activity as tolerated OXYGEN:  Home Oxygen: No.  Oxygen Delivery: room air DISCHARGE LOCATION:  home   If you experience worsening of your admission symptoms, develop shortness of breath, life threatening emergency, suicidal or homicidal thoughts you must seek medical attention immediately by calling 911 or calling your MD immediately  if symptoms less severe.  You Must read complete instructions/literature along with all the possible adverse reactions/side effects for all the Medicines you take and that have been prescribed to you. Take any new Medicines after you have completely understood and accpet all the possible adverse reactions/side effects.   Please note  You were cared for by a hospitalist during your hospital stay. If you have any questions about your discharge medications or the care you received while you were in the hospital after you are discharged, you can call the unit and asked to speak with the hospitalist on call if the hospitalist that took care of you is not available. Once you are discharged, your primary care physician will handle any further medical issues. Please note that NO REFILLS for any discharge medications will be authorized once you are discharged, as it is imperative that you return to your primary care physician (or establish a relationship  with a primary care physician if you do not have one) for your aftercare needs so that they can reassess your need for medications and monitor your lab values.    On the day of Discharge:  VITAL SIGNS:  Blood pressure 128/78, pulse (!) 55, temperature 97.8 F (36.6 C), temperature source Oral, resp. rate 18, height 5\' 10"  (1.778 m), weight 111.6 kg, SpO2 98 %. PHYSICAL EXAMINATION:  GENERAL:  47 y.o.-year-old  patient lying in the bed with no acute distress.  EYES: Pupils equal, round, reactive to light and accommodation. No scleral icterus. Extraocular muscles intact.  HEENT: Head atraumatic, normocephalic. Oropharynx and nasopharynx clear.  NECK:  Supple, no jugular venous distention. No thyroid enlargement, no tenderness.  LUNGS: Normal breath sounds bilaterally, no wheezing, rales,rhonchi or crepitation. No use of accessory muscles of respiration.  CARDIOVASCULAR: S1, S2 normal. No murmurs, rubs, or gallops.  ABDOMEN: Soft, non-tender, non-distended. Bowel sounds present. No organomegaly or mass.  EXTREMITIES: No pedal edema, cyanosis, or clubbing.  NEUROLOGIC: Cranial nerves II through XII are intact. Muscle strength 5/5 in all extremities. Sensation intact. Gait not checked.  PSYCHIATRIC: The patient is alert and oriented x 3.  SKIN: No obvious rash, lesion, or ulcer.  DATA REVIEW:   CBC Recent Labs  Lab 02/20/18 2134  WBC 11.9*  HGB 15.4  HCT 47.1  PLT 319    Chemistries  Recent Labs  Lab 02/20/18 2134  NA 138  K 4.1  CL 103  CO2 27  GLUCOSE 163*  BUN 19  CREATININE 0.92  CALCIUM 9.8  AST 26  ALT 44  ALKPHOS 52  BILITOT 1.1    Follow-up Information    Johnson, Megan P, DO. Schedule an appointment as soon as possible for a visit in 5 day(s).   Specialty:  Family Medicine Contact information: [place] [address] [phone]           Management plans discussed with the patient, family and they are in agreement.  CODE STATUS: Prior   TOTAL TIME TAKING CARE OF THIS PATIENT: 45 minutes.    Max Sane M.D on 02/24/2018 at 9:16 PM  Between 7am to 6pm - Pager - [phone]  After 6pm go to [email or web address] - Proofreader  [facility]  Office  [phone]  CC: Primary care physician; Valerie Roys, DO   Note: This dictation was prepared with Dragon dictation along with smaller phrase technology. Any  transcriptional errors that result from this process are unintentional.

## 2018-02-24 NOTE — Telephone Encounter (Signed)
Lmov for patient to call and schedule ED fu appointment They were seen on 02/20/18 for CP

## 2018-02-27 MED ORDER — ESCITALOPRAM OXALATE 20 MG PO TABS: ORAL_TABLET | ORAL | 3 refills | Status: DC

## 2018-02-28 DIAGNOSIS — B009 Herpesviral infection, unspecified: Secondary | ICD-10-CM | POA: Diagnosis not present

## 2018-02-28 DIAGNOSIS — D2361 Other benign neoplasm of skin of right upper limb, including shoulder: Secondary | ICD-10-CM | POA: Diagnosis not present

## 2018-02-28 DIAGNOSIS — Z1283 Encounter for screening for malignant neoplasm of skin: Secondary | ICD-10-CM | POA: Diagnosis not present

## 2018-02-28 DIAGNOSIS — D485 Neoplasm of uncertain behavior of skin: Secondary | ICD-10-CM | POA: Diagnosis not present

## 2018-02-28 DIAGNOSIS — D1800 Hemangioma unspecified site: Secondary | ICD-10-CM | POA: Diagnosis not present

## 2018-02-28 DIAGNOSIS — D229 Melanocytic nevi, unspecified: Secondary | ICD-10-CM | POA: Diagnosis not present

## 2018-02-28 NOTE — Telephone Encounter (Signed)
Lmov for patient to call and schedule ED fu appointment They were seen on 02/20/18 for CP

## 2018-03-01 NOTE — Telephone Encounter (Signed)
Sent letter

## 2018-03-02 LAB — HM DIABETES EYE EXAM

## 2018-04-14 ENCOUNTER — Other Ambulatory Visit: Payer: Self-pay | Admitting: Family Medicine

## 2018-05-12 ENCOUNTER — Encounter: Payer: Self-pay | Admitting: Family Medicine

## 2018-05-12 ENCOUNTER — Ambulatory Visit (INDEPENDENT_AMBULATORY_CARE_PROVIDER_SITE_OTHER): Payer: BLUE CROSS/BLUE SHIELD | Admitting: Family Medicine

## 2018-05-12 VITALS — BP 114/64 | HR 72 | Temp 97.7°F | Ht 71.0 in | Wt 248.0 lb

## 2018-05-12 DIAGNOSIS — M5442 Lumbago with sciatica, left side: Secondary | ICD-10-CM | POA: Diagnosis not present

## 2018-05-12 DIAGNOSIS — E119 Type 2 diabetes mellitus without complications: Secondary | ICD-10-CM | POA: Diagnosis not present

## 2018-05-12 LAB — BAYER DCA HB A1C WAIVED: HB A1C (BAYER DCA - WAIVED): 7.6 % — ABNORMAL HIGH (ref <7.0)

## 2018-05-12 MED ORDER — ERTUGLIFLOZIN L-PYROGLUTAMICAC 5 MG PO TABS: 5.0000 mg | ORAL_TABLET | Freq: Every day | ORAL | 3 refills | Status: DC

## 2018-05-12 MED ORDER — CYCLOBENZAPRINE HCL 10 MG PO TABS: 10.0000 mg | ORAL_TABLET | Freq: Every day | ORAL | 0 refills | Status: DC

## 2018-05-12 MED ORDER — NAPROXEN 500 MG PO TABS: 500.0000 mg | ORAL_TABLET | Freq: Two times a day (BID) | ORAL | 0 refills | Status: DC

## 2018-05-12 NOTE — Assessment & Plan Note (Signed)
Did not pick up his steglatro. A1c stable at 7.6- will start steglatro and recheck 3 months. Call with any concerns.

## 2018-05-12 NOTE — Progress Notes (Signed)
BP 114/64 (BP Location: Left Arm, Patient Position: Sitting, Cuff Size: Normal)   Pulse 72   Temp 97.7 F (36.5 C) (Oral)   Ht 5\' 11"  (1.803 m)   Wt 248 lb (112.5 kg)   SpO2 96%   BMI 34.59 kg/m    Subjective:    Patient ID: Glenn Flynn, male    DOB: [birth year], 48 y.o.   MRN: [id number]  HPI: Glenn Flynn is a 48 y.o. male  Chief Complaint  Patient presents with  . Diabetes  . Back Pain    Strained lower left part of back moving couch. Ongoing 3 weeks.    DIABETES Hypoglycemic episodes:no Polydipsia/polyuria: no Visual disturbance: no Chest pain: no Paresthesias: no Glucose Monitoring: no  Accucheck frequency: Daily  Fasting glucose: Taking Insulin?: no Blood Pressure Monitoring: not checking Retinal Examination: Not up to Date Foot Exam: Done today Diabetic Education: Completed Pneumovax: Up to Date Influenza: Up to Date Aspirin: no  BACK PAIN Duration: 3 weeks Mechanism of injury: lifting and twisted Location: Left and low back Onset: sudden Severity: moderate Quality: sharp occasionally, dull aching Frequency: constant, waxes and wanes Radiation: L leg above the knee Aggravating factors: lifting and movement Alleviating factors: rest Status: fluctuating Treatments attempted: rest  Relief with NSAIDs?: No NSAIDs Taken Nighttime pain:  no Paresthesias / decreased sensation:  no Bowel / bladder incontinence:  no Fevers:  no Dysuria / urinary frequency:  no   Relevant past medical, surgical, family and social history reviewed and updated as indicated. Interim medical history since our last visit reviewed. Allergies and medications reviewed and updated.  Review of Systems  Per HPI unless specifically indicated above     Objective:    BP 114/64 (BP Location: Left Arm, Patient Position: Sitting, Cuff Size: Normal)   Pulse 72   Temp 97.7 F (36.5 C) (Oral)   Ht 5\' 11"  (1.803 m)   Wt 248 lb (112.5 kg)   SpO2 96%   BMI 34.59 kg/m    Wt Readings from Last 3 Encounters:  05/12/18 248 lb (112.5 kg)  02/22/18 246 lb 1.6 oz (111.6 kg)  02/09/18 248 lb 6 oz (112.7 kg)    Physical Exam  Results for orders placed or performed during the hospital encounter of 18/56/31  Basic metabolic panel  Result Value Ref Range   Sodium 138 135 - 145 mmol/L   Potassium 4.1 3.5 - 5.1 mmol/L   Chloride 103 98 - 111 mmol/L   CO2 27 22 - 32 mmol/L   Glucose, Bld 163 (H) 70 - 99 mg/dL   BUN 19 6 - 20 mg/dL   Creatinine, Ser 0.92 0.61 - 1.24 mg/dL   Calcium 9.8 8.9 - 10.3 mg/dL   GFR calc non Af Amer >60 >60 mL/min   GFR calc Af Amer >60 >60 mL/min   Anion gap 8 5 - 15  CBC  Result Value Ref Range   WBC 11.9 (H) 4.0 - 10.5 K/uL   RBC 5.24 4.22 - 5.81 MIL/uL   Hemoglobin 15.4 13.0 - 17.0 g/dL   HCT 47.1 39.0 - 52.0 %   MCV 89.9 80.0 - 100.0 fL   MCH 29.4 26.0 - 34.0 pg   MCHC 32.7 30.0 - 36.0 g/dL   RDW 12.9 11.5 - 15.5 %   Platelets 319 150 - 400 K/uL   nRBC 0.0 0.0 - 0.2 %  Troponin I  Result Value Ref Range   Troponin I <0.03 <0.03 ng/mL  Hepatic function panel  Result Value Ref Range   Total Protein 7.9 6.5 - 8.1 g/dL   Albumin 4.4 3.5 - 5.0 g/dL   AST 26 15 - 41 U/L   ALT 44 0 - 44 U/L   Alkaline Phosphatase 52 38 - 126 U/L   Total Bilirubin 1.1 0.3 - 1.2 mg/dL   Bilirubin, Direct 0.1 0.0 - 0.2 mg/dL   Indirect Bilirubin 1.0 (H) 0.3 - 0.9 mg/dL  Lipase, blood  Result Value Ref Range   Lipase 33 11 - 51 U/L  Troponin I  Result Value Ref Range   Troponin I 0.06 (HH) <0.03 ng/mL  TSH  Result Value Ref Range   TSH 2.302 0.350 - 4.500 uIU/mL  Troponin I  Result Value Ref Range   Troponin I 0.72 (HH) <0.03 ng/mL  Troponin I  Result Value Ref Range   Troponin I 1.44 (HH) <0.03 ng/mL  Troponin I  Result Value Ref Range   Troponin I 1.41 (HH) <0.03 ng/mL  Hemoglobin A1c  Result Value Ref Range   Hgb A1c MFr Bld 7.6 (H) 4.8 - 5.6 %   Mean Plasma Glucose 171.42 mg/dL  Glucose, capillary  Result Value Ref  Range   Glucose-Capillary 135 (H) 70 - 99 mg/dL      Assessment & Plan:   Problem List Items Addressed This Visit      Endocrine   Type 2 diabetes mellitus ([place]) - Primary    Did not pick up his steglatro. A1c stable at 7.6- will start steglatro and recheck 3 months. Call with any concerns.       Relevant Medications   Ertugliflozin L-PyroglutamicAc (STEGLATRO) 5 MG TABS   Other Relevant Orders   Bayer DCA Hb A1c Waived    Other Visit Diagnoses    Acute left-sided low back pain with left-sided sciatica       Will treat with flexeril and naproxen. Exercises given. Call if not getting better or getting worse.    Relevant Medications   naproxen (NAPROSYN) 500 MG tablet   cyclobenzaprine (FLEXERIL) 10 MG tablet       Follow up plan: Return in about 3 months (around 08/11/2018) for 6 month follow up.

## 2018-05-12 NOTE — Patient Instructions (Signed)

## 2018-06-04 ENCOUNTER — Other Ambulatory Visit: Payer: Self-pay | Admitting: Family Medicine

## 2018-06-06 NOTE — Telephone Encounter (Signed)
Requested Prescriptions  Pending Prescriptions Disp Refills  . naproxen (NAPROSYN) 500 MG tablet [Pharmacy Med Name: NAPROXEN 500 MG TABLET] 180 tablet 0    Sig: TAKE 1 TABLET (500 MG TOTAL) BY MOUTH 2 (TWO) TIMES DAILY WITH A MEAL.     Analgesics:  NSAIDS Passed - 06/04/2018  9:35 AM      Passed - Cr in normal range and within 360 days    Creatinine, Ser  Date Value Ref Range Status  02/20/2018 0.92 0.61 - 1.24 mg/dL Final         Passed - HGB in normal range and within 360 days    Hemoglobin  Date Value Ref Range Status  02/20/2018 15.4 13.0 - 17.0 g/dL Final  02/09/2018 15.5 13.0 - 17.7 g/dL Final         Passed - Patient is not pregnant      Passed - Valid encounter within last 12 months    Recent Outpatient Visits          3 weeks ago Type 2 diabetes mellitus without complication, without long-term current use of insulin ([place])   [place], Megan P, DO   3 months ago Routine general medical examination at a health care facility   [facility], [place] P, DO   8 months ago Diarrhea, unspecified type   [address], Megan P, DO   1 year ago Type 2 diabetes mellitus without complication, without long-term current use of insulin ([place])   [place], [place], DO   1 year ago Acute non-recurrent maxillary sinusitis   [facility] Valerie Roys, DO      Future Appointments            In 2 months Wynetta Emery, Barb Merino, DO [facility], [facility]

## 2018-07-18 ENCOUNTER — Encounter: Payer: Self-pay | Admitting: Family Medicine

## 2018-07-18 ENCOUNTER — Ambulatory Visit (INDEPENDENT_AMBULATORY_CARE_PROVIDER_SITE_OTHER): Payer: BLUE CROSS/BLUE SHIELD | Admitting: Family Medicine

## 2018-07-18 DIAGNOSIS — L259 Unspecified contact dermatitis, unspecified cause: Secondary | ICD-10-CM | POA: Diagnosis not present

## 2018-07-18 MED ORDER — PREDNISONE 10 MG PO TABS: ORAL_TABLET | ORAL | 0 refills | Status: DC

## 2018-07-18 MED ORDER — TRIAMCINOLONE ACETONIDE 0.5 % EX OINT: 1.0000 "application " | TOPICAL_OINTMENT | Freq: Two times a day (BID) | CUTANEOUS | 0 refills | Status: DC

## 2018-07-18 NOTE — Progress Notes (Signed)
There were no vitals taken for this visit.   Subjective:    Patient ID: Glenn Flynn, male    DOB: 07-Jan-1971, 48 y.o.   MRN: [id number]  HPI: Glenn Flynn is a 48 y.o. male  Chief Complaint  Patient presents with  . Rash    pt states he has a rash mainly on his torso, small spots throughout body. States it does itch, states it has been there for a while.   . TELEMEDICINE VISIT   RASH Duration:   2 months Location: trunk  Itching: yes Burning: no Redness: yes Oozing: no Scaling: yes Blisters: no Painful: no Fevers: no Change in detergents/soaps/personal care products: no Recent illness: no Recent travel:no History of same: no Context: better Alleviating factors: hydrocortisone cream Treatments attempted:hydrocortisone cream Shortness of breath: no  Throat/tongue swelling: no Myalgias/arthralgias: no   Relevant past medical, surgical, family and social history reviewed and updated as indicated. Interim medical history since our last visit reviewed. Allergies and medications reviewed and updated.  Review of Systems  Constitutional: Negative.   HENT: Negative.   Respiratory: Negative.   Cardiovascular: Negative.   Skin: Positive for rash. Negative for color change, pallor and wound.  Neurological: Negative.   Psychiatric/Behavioral: Negative.     Per HPI unless specifically indicated above     Objective:    There were no vitals taken for this visit.  Wt Readings from Last 3 Encounters:  05/12/18 248 lb (112.5 kg)  02/22/18 246 lb 1.6 oz (111.6 kg)  02/09/18 248 lb 6 oz (112.7 kg)    Physical Exam Constitutional:      General: He is not in acute distress.    Appearance: Normal appearance. He is well-developed. He is obese. He is not ill-appearing, toxic-appearing or diaphoretic.  HENT:     Head: Normocephalic and atraumatic.     Right Ear: Hearing and external ear normal.     Left Ear: Hearing and external ear normal.     Nose: Nose  normal.  Eyes:     General: Lids are normal. No scleral icterus.       Right eye: No discharge.        Left eye: No discharge.     Extraocular Movements: Extraocular movements intact.     Conjunctiva/sclera: Conjunctivae normal.     Pupils: Pupils are equal, round, and reactive to light.  Neck:     Musculoskeletal: Normal range of motion.  Pulmonary:     Effort: Pulmonary effort is normal. No respiratory distress.  Musculoskeletal: Normal range of motion.  Skin:    Coloration: Skin is not jaundiced or pale.     Findings: Rash (papular erythematous patches with central clearing on chest, arms, and back) present. No bruising, erythema or lesion.  Neurological:     General: No focal deficit present.     Mental Status: He is alert and oriented to person, place, and time.  Psychiatric:        Mood and Affect: Mood normal.        Speech: Speech normal.        Behavior: Behavior normal.        Thought Content: Thought content normal.        Judgment: Judgment normal.     Results for orders placed or performed in visit on 05/13/18  HM DIABETES EYE EXAM  Result Value Ref Range   HM Diabetic Eye Exam No Retinopathy No Retinopathy      Assessment &  Plan:   Problem List Items Addressed This Visit    None    Visit Diagnoses    Contact dermatitis, unspecified contact dermatitis type, unspecified trigger    -  Primary   Will treat with prednisone taper and triamcinalone cream- if not getting better, consider anti-fungal. Call with any concerns or if not improving.        Follow up plan: Return in about 4 weeks (around 08/15/2018) for Follow up DM.    Marland Kitchen This visit was completed via Skype due to the restrictions of the COVID-19 pandemic. All issues as above were discussed and addressed. Physical exam was done as above through visual confirmation on Skype. If it was felt that the patient should be evaluated in the office, they were directed there. The patient verbally consented to this  visit. . Location of the patient: home . Location of the provider: home . Those involved with this call:  . Provider: Park Liter, DO . CMA: Yvonna Alanis, [place] . Front Desk/Registration: Don Perking  . Time spent on call: 10 minutes with patient face to face via video conference. More than 50% of this time was spent in counseling and coordination of care.

## 2018-08-11 ENCOUNTER — Telehealth: Payer: Self-pay | Admitting: Family Medicine

## 2018-08-11 NOTE — Telephone Encounter (Signed)
Called pt to set up virtual appt for Monday and reschedule physical no answer, left voicemail.

## 2018-08-15 ENCOUNTER — Encounter: Payer: Self-pay | Admitting: Family Medicine

## 2018-08-15 ENCOUNTER — Ambulatory Visit (INDEPENDENT_AMBULATORY_CARE_PROVIDER_SITE_OTHER): Payer: BLUE CROSS/BLUE SHIELD | Admitting: Family Medicine

## 2018-08-15 ENCOUNTER — Other Ambulatory Visit: Payer: Self-pay

## 2018-08-15 ENCOUNTER — Ambulatory Visit: Payer: BLUE CROSS/BLUE SHIELD | Admitting: Family Medicine

## 2018-08-15 DIAGNOSIS — E782 Mixed hyperlipidemia: Secondary | ICD-10-CM | POA: Diagnosis not present

## 2018-08-15 DIAGNOSIS — G4700 Insomnia, unspecified: Secondary | ICD-10-CM

## 2018-08-15 DIAGNOSIS — F411 Generalized anxiety disorder: Secondary | ICD-10-CM | POA: Diagnosis not present

## 2018-08-15 DIAGNOSIS — E119 Type 2 diabetes mellitus without complications: Secondary | ICD-10-CM | POA: Diagnosis not present

## 2018-08-15 MED ORDER — ALPRAZOLAM 0.5 MG PO TABS: 0.5000 mg | ORAL_TABLET | Freq: Two times a day (BID) | ORAL | 1 refills | Status: DC | PRN

## 2018-08-15 MED ORDER — METFORMIN HCL ER 500 MG PO TB24: 1000.0000 mg | ORAL_TABLET | Freq: Two times a day (BID) | ORAL | 1 refills | Status: DC

## 2018-08-15 MED ORDER — BUPROPION HCL ER (SR) 150 MG PO TB12: 150.0000 mg | ORAL_TABLET | Freq: Two times a day (BID) | ORAL | 1 refills | Status: DC

## 2018-08-15 MED ORDER — ESCITALOPRAM OXALATE 20 MG PO TABS: 20.0000 mg | ORAL_TABLET | Freq: Every day | ORAL | 1 refills | Status: DC

## 2018-08-15 MED ORDER — TRAZODONE HCL 100 MG PO TABS: ORAL_TABLET | ORAL | 1 refills | Status: DC

## 2018-08-15 MED ORDER — ERTUGLIFLOZIN L-PYROGLUTAMICAC 5 MG PO TABS: 5.0000 mg | ORAL_TABLET | Freq: Every day | ORAL | 3 refills | Status: DC

## 2018-08-15 NOTE — Progress Notes (Signed)
There were no vitals taken for this visit.   Subjective:    Patient ID: Glenn Flynn, male    DOB: 02-21-1971, 48 y.o.   MRN: [id number]  HPI: Glenn Flynn is a 48 y.o. male  Chief Complaint  Patient presents with  . Diabetes  . Hyperlipidemia  . Anxiety   DIABETES Hypoglycemic episodes:no Polydipsia/polyuria: yes- been a lot more thirsty with the steglatro Visual disturbance: no Chest pain: no Paresthesias: no Glucose Monitoring: no  Accucheck frequency: Not Checking Taking Insulin?: no Blood Pressure Monitoring: not checking Retinal Examination: Up to Date Foot Exam: Not up to Date Diabetic Education: Completed Pneumovax: Up to Date Influenza: Up to Date Aspirin: no  HYPERLIPIDEMIA Hyperlipidemia status: stable- working on diet Satisfied with current treatment?  yes Side effects:  Not on anything Past cholesterol meds: none Supplements: none Aspirin:  no The ASCVD Risk score [name], et al., 2013) failed to calculate for the following reasons:   The patient has a prior MI or stroke diagnosis Chest pain:  no Coronary artery disease:  no Family history CAD:  yes  ANXIETY/STRESS Duration:better Anxious mood: yes  Excessive worrying: no Irritability: no  Sweating: no Nausea: no Palpitations:no Hyperventilation: no Panic attacks: no Agoraphobia: no  Obscessions/compulsions: no Depressed mood: no Depression screen [facility] 2/9 08/15/2018 02/09/2018 02/22/2017 01/22/2017 10/26/2016  Decreased Interest 0 1 1 1  0  Down, Depressed, Hopeless 0 1 1 1 1   PHQ - 2 Score 0 2 2 2 1   Altered sleeping 3 2 - 1 1  Tired, decreased energy 3 1 - 2 1  Change in appetite 0 0 - 0 1  Feeling bad or failure about yourself  0 0 - 1 1  Trouble concentrating 0 1 - 1 0  Moving slowly or fidgety/restless 0 0 - 0 0  Suicidal thoughts 0 0 - 0 0  PHQ-9 Score 6 6 - 7 5  Difficult doing work/chores Not difficult at all Somewhat difficult - Somewhat difficult -   GAD 7 :  Generalized Anxiety Score 08/15/2018 02/22/2017 10/26/2016 04/16/2016  Nervous, Anxious, on Edge 0 1 2 3   Control/stop worrying 0 1 1 1   Worry too much - different things 0 0 1 1  Trouble relaxing 0 0 0 2  Restless 0 0 0 0  Easily annoyed or irritable 0 0 0 0  Afraid - awful might happen 0 0 0 0  Total GAD 7 Score 0 2 4 7   Anxiety Difficulty Not difficult at all Somewhat difficult Not difficult at all Somewhat difficult   Anhedonia: no Weight changes: no Insomnia: no not with medicine  Hypersomnia: no Fatigue/loss of energy: no Feelings of worthlessness: no Feelings of guilt: no Impaired concentration/indecisiveness: no Suicidal ideations: no  Crying spells: no Recent Stressors/Life Changes: yes   Relationship problems: no   Family stress: no     Financial stress: no    Job stress: no    Recent death/loss: no  Relevant past medical, surgical, family and social history reviewed and updated as indicated. Interim medical history since our last visit reviewed. Allergies and medications reviewed and updated.  Review of Systems  Constitutional: Negative.   Respiratory: Negative.   Cardiovascular: Negative.   Gastrointestinal: Negative.   Neurological: Negative.   Psychiatric/Behavioral: Negative.     Per HPI unless specifically indicated above     Objective:    There were no vitals taken for this visit.  Wt Readings from Last 3 Encounters:  05/12/18 248 lb (112.5 kg)  02/22/18 246 lb 1.6 oz (111.6 kg)  02/09/18 248 lb 6 oz (112.7 kg)    Physical Exam Vitals signs and nursing note reviewed.  Constitutional:      General: He is not in acute distress.    Appearance: Normal appearance. He is not ill-appearing, toxic-appearing or diaphoretic.  HENT:     Head: Normocephalic and atraumatic.     Right Ear: External ear normal.     Left Ear: External ear normal.     Nose: Nose normal.     Mouth/Throat:     Mouth: Mucous membranes are moist.     Pharynx: Oropharynx is  clear.  Eyes:     General: No scleral icterus.       Right eye: No discharge.        Left eye: No discharge.     Conjunctiva/sclera: Conjunctivae normal.     Pupils: Pupils are equal, round, and reactive to light.  Neck:     Musculoskeletal: Normal range of motion.  Pulmonary:     Effort: Pulmonary effort is normal. No respiratory distress.     Comments: Speaking in full sentences Musculoskeletal: Normal range of motion.  Skin:    Coloration: Skin is not jaundiced or pale.     Findings: No bruising, erythema, lesion or rash.  Neurological:     Mental Status: He is alert and oriented to person, place, and time. Mental status is at baseline.  Psychiatric:        Mood and Affect: Mood normal.        Behavior: Behavior normal.        Thought Content: Thought content normal.        Judgment: Judgment normal.     Results for orders placed or performed in visit on 05/13/18  HM DIABETES EYE EXAM  Result Value Ref Range   HM Diabetic Eye Exam No Retinopathy No Retinopathy      Assessment & Plan:   Problem List Items Addressed This Visit      Endocrine   Type 2 diabetes mellitus ([place]) - Primary    Having a bit more thirst- will check A1c and treat as needed. Await results. Call with any concerns.       Relevant Medications   metFORMIN (GLUCOPHAGE-XR) 500 MG 24 hr tablet   Ertugliflozin L-PyroglutamicAc (STEGLATRO) 5 MG TABS   Other Relevant Orders   Comprehensive metabolic panel   Bayer DCA Hb A1c Waived     Other   Hyperlipidemia    Rechecking levels ASAP. Await results. Call with any concerns.       Relevant Orders   Comprehensive metabolic panel   Lipid Panel w/o Chol/HDL Ratio   Morbid obesity ([place])    Continue to work on diet and exercise with goal of losing 1-2lbs per week. Call with any concerns.       Relevant Medications   metFORMIN (GLUCOPHAGE-XR) 500 MG 24 hr tablet   Ertugliflozin L-PyroglutamicAc (STEGLATRO) 5 MG TABS   Anxiety disorder    Stable on  current regimen, has been doing well and is considering decreasing medication. Will leave him where his is for now and if still doing well in 3 months will stop the wellbutrin.       Relevant Medications   escitalopram (LEXAPRO) 20 MG tablet   traZODone (DESYREL) 100 MG tablet   buPROPion (WELLBUTRIN SR) 150 MG 12 hr tablet   ALPRAZolam (XANAX) 0.5 MG tablet  Insomnia    Under good control on current regimen. Continue current regimen. Continue to monitor. Call with any concerns. Refills given.            Follow up plan: Return in about 3 months (around 11/14/2018) for follow up DM.   Marland Kitchen This visit was completed via Skype due to the restrictions of the COVID-19 pandemic. All issues as above were discussed and addressed. Physical exam was done as above through visual confirmation on Skype. If it was felt that the patient should be evaluated in the office, they were directed there. The patient verbally consented to this visit. . Location of the patient: home . Location of the provider: work . Those involved with this call:  . Provider: Park Liter, DO . CMA: Tiffany Reel, CMA . Front Desk/Registration: Don Perking  . Time spent on call: 25 minutes with patient face to face via video conference. More than 50% of this time was spent in counseling and coordination of care. 40 minutes total spent in review of patient's record and preparation of their chart.

## 2018-08-15 NOTE — Assessment & Plan Note (Signed)
Under good control on current regimen. Continue current regimen. Continue to monitor. Call with any concerns. Refills given.   

## 2018-08-15 NOTE — Assessment & Plan Note (Signed)
Having a bit more thirst- will check A1c and treat as needed. Await results. Call with any concerns.

## 2018-08-15 NOTE — Assessment & Plan Note (Signed)
Stable on current regimen, has been doing well and is considering decreasing medication. Will leave him where his is for now and if still doing well in 3 months will stop the wellbutrin.

## 2018-08-15 NOTE — Assessment & Plan Note (Signed)
Continue to work on diet and exercise with goal of losing 1-2lbs per week. Call with any concerns.  

## 2018-08-15 NOTE — Assessment & Plan Note (Signed)
Rechecking levels ASAP. Await results. Call with any concerns.

## 2018-08-19 ENCOUNTER — Other Ambulatory Visit: Payer: Self-pay

## 2018-08-19 ENCOUNTER — Other Ambulatory Visit: Payer: BLUE CROSS/BLUE SHIELD

## 2018-08-19 DIAGNOSIS — E119 Type 2 diabetes mellitus without complications: Secondary | ICD-10-CM

## 2018-08-19 DIAGNOSIS — E782 Mixed hyperlipidemia: Secondary | ICD-10-CM

## 2018-08-19 LAB — BAYER DCA HB A1C WAIVED: HB A1C (BAYER DCA - WAIVED): 8.3 % — ABNORMAL HIGH (ref <7.0)

## 2018-08-20 LAB — COMPREHENSIVE METABOLIC PANEL
ALT: 34 IU/L (ref 0–44)
AST: 14 IU/L (ref 0–40)
Albumin/Globulin Ratio: 1.8 (ref 1.2–2.2)
Albumin: 4.5 g/dL (ref 4.0–5.0)
Alkaline Phosphatase: 66 IU/L (ref 39–117)
BUN/Creatinine Ratio: 24 — ABNORMAL HIGH (ref 9–20)
BUN: 24 mg/dL (ref 6–24)
Bilirubin Total: 0.6 mg/dL (ref 0.0–1.2)
CO2: 24 mmol/L (ref 20–29)
Calcium: 9.9 mg/dL (ref 8.7–10.2)
Chloride: 100 mmol/L (ref 96–106)
Creatinine, Ser: 0.98 mg/dL (ref 0.76–1.27)
GFR calc Af Amer: 104 mL/min/{1.73_m2} (ref >59)
GFR calc non Af Amer: 90 mL/min/{1.73_m2} (ref >59)
Globulin, Total: 2.5 g/dL (ref 1.5–4.5)
Glucose: 175 mg/dL — ABNORMAL HIGH (ref 65–99)
Potassium: 5.2 mmol/L (ref 3.5–5.2)
Sodium: 139 mmol/L (ref 134–144)
Total Protein: 7 g/dL (ref 6.0–8.5)

## 2018-08-20 LAB — LIPID PANEL W/O CHOL/HDL RATIO
Cholesterol, Total: 216 mg/dL — ABNORMAL HIGH (ref 100–199)
HDL: 44 mg/dL (ref >39)
LDL Calculated: 131 mg/dL — ABNORMAL HIGH (ref 0–99)
Triglycerides: 204 mg/dL — ABNORMAL HIGH (ref 0–149)
VLDL Cholesterol Cal: 41 mg/dL — ABNORMAL HIGH (ref 5–40)

## 2018-08-25 ENCOUNTER — Encounter: Payer: Self-pay | Admitting: Family Medicine

## 2018-11-15 ENCOUNTER — Encounter: Payer: Self-pay | Admitting: Family Medicine

## 2018-11-15 ENCOUNTER — Other Ambulatory Visit: Payer: Self-pay

## 2018-11-15 ENCOUNTER — Ambulatory Visit (INDEPENDENT_AMBULATORY_CARE_PROVIDER_SITE_OTHER): Payer: BC Managed Care – PPO | Admitting: Family Medicine

## 2018-11-15 VITALS — BP 126/79 | HR 72 | Temp 98.4°F | Ht 71.0 in | Wt 242.0 lb

## 2018-11-15 DIAGNOSIS — Z23 Encounter for immunization: Secondary | ICD-10-CM

## 2018-11-15 DIAGNOSIS — F411 Generalized anxiety disorder: Secondary | ICD-10-CM

## 2018-11-15 DIAGNOSIS — E119 Type 2 diabetes mellitus without complications: Secondary | ICD-10-CM | POA: Diagnosis not present

## 2018-11-15 LAB — BAYER DCA HB A1C WAIVED: HB A1C (BAYER DCA - WAIVED): 8.3 % — ABNORMAL HIGH (ref <7.0)

## 2018-11-15 NOTE — Assessment & Plan Note (Signed)
Doing well. Will come off the wellbutrin. Call if starting to feel worse.

## 2018-11-15 NOTE — Progress Notes (Signed)
BP 126/79   Pulse 72   Temp 98.4 F (36.9 C) (Oral)   Ht 5\' 11"  (1.803 m)   Wt 242 lb (109.8 kg)   SpO2 96%   BMI 33.75 kg/m    Subjective:    Patient ID: Glenn Flynn, male    DOB: July 03, 1970, 48 y.o.   MRN: [id number]  HPI: Glenn Flynn is a 48 y.o. male  Chief Complaint  Patient presents with  . Diabetes    f/u   DIABETES Hypoglycemic episodes:no Polydipsia/polyuria: no Visual disturbance: no Chest pain: no Paresthesias: no Glucose Monitoring: yes  Accucheck frequency: Daily Taking Insulin?: no Blood Pressure Monitoring: not checking Retinal Examination: Up to Date Foot Exam: Done today Diabetic Education: Completed Pneumovax: Up to Date Influenza: Up to Date Aspirin: no  Relevant past medical, surgical, family and social history reviewed and updated as indicated. Interim medical history since our last visit reviewed. Allergies and medications reviewed and updated.  Review of Systems  Constitutional: Negative.   Respiratory: Negative.   Cardiovascular: Negative.   Musculoskeletal: Negative.   Psychiatric/Behavioral: Negative.     Per HPI unless specifically indicated above     Objective:    BP 126/79   Pulse 72   Temp 98.4 F (36.9 C) (Oral)   Ht 5\' 11"  (1.803 m)   Wt 242 lb (109.8 kg)   SpO2 96%   BMI 33.75 kg/m   Wt Readings from Last 3 Encounters:  11/15/18 242 lb (109.8 kg)  08/19/18 242 lb (109.8 kg)  05/12/18 248 lb (112.5 kg)    Physical Exam Vitals signs and nursing note reviewed.  Constitutional:      General: He is not in acute distress.    Appearance: Normal appearance. He is not ill-appearing, toxic-appearing or diaphoretic.  HENT:     Head: Normocephalic and atraumatic.     Right Ear: External ear normal.     Left Ear: External ear normal.     Nose: Nose normal.     Mouth/Throat:     Mouth: Mucous membranes are moist.     Pharynx: Oropharynx is clear.  Eyes:     General: No scleral icterus.       Right  eye: No discharge.        Left eye: No discharge.     Extraocular Movements: Extraocular movements intact.     Conjunctiva/sclera: Conjunctivae normal.     Pupils: Pupils are equal, round, and reactive to light.  Neck:     Musculoskeletal: Normal range of motion and neck supple.  Cardiovascular:     Rate and Rhythm: Normal rate and regular rhythm.     Pulses: Normal pulses.     Heart sounds: Normal heart sounds. No murmur. No friction rub. No gallop.   Pulmonary:     Effort: Pulmonary effort is normal. No respiratory distress.     Breath sounds: Normal breath sounds. No stridor. No wheezing, rhonchi or rales.  Chest:     Chest wall: No tenderness.  Musculoskeletal: Normal range of motion.  Skin:    General: Skin is warm and dry.     Capillary Refill: Capillary refill takes less than 2 seconds.     Coloration: Skin is not jaundiced or pale.     Findings: No bruising, erythema, lesion or rash.  Neurological:     General: No focal deficit present.     Mental Status: He is alert and oriented to person, place, and time. Mental status is  at baseline.  Psychiatric:        Mood and Affect: Mood normal.        Behavior: Behavior normal.        Thought Content: Thought content normal.        Judgment: Judgment normal.     Results for orders placed or performed in visit on 08/19/18  Lipid Panel w/o Chol/HDL Ratio  Result Value Ref Range   Cholesterol, Total 216 (H) 100 - 199 mg/dL   Triglycerides 204 (H) 0 - 149 mg/dL   HDL 44 >39 mg/dL   VLDL Cholesterol Cal 41 (H) 5 - 40 mg/dL   LDL Calculated 131 (H) 0 - 99 mg/dL  Bayer DCA Hb A1c Waived  Result Value Ref Range   HB A1C (BAYER DCA - WAIVED) 8.3 (H) <7.0 %  Comprehensive metabolic panel  Result Value Ref Range   Glucose 175 (H) 65 - 99 mg/dL   BUN 24 6 - 24 mg/dL   Creatinine, Ser 0.98 0.76 - 1.27 mg/dL   GFR calc non Af Amer 90 >59 mL/min/1.73   GFR calc Af Amer 104 >59 mL/min/1.73   BUN/Creatinine Ratio 24 (H) 9 - 20    Sodium 139 134 - 144 mmol/L   Potassium 5.2 3.5 - 5.2 mmol/L   Chloride 100 96 - 106 mmol/L   CO2 24 20 - 29 mmol/L   Calcium 9.9 8.7 - 10.2 mg/dL   Total Protein 7.0 6.0 - 8.5 g/dL   Albumin 4.5 4.0 - 5.0 g/dL   Globulin, Total 2.5 1.5 - 4.5 g/dL   Albumin/Globulin Ratio 1.8 1.2 - 2.2   Bilirubin Total 0.6 0.0 - 1.2 mg/dL   Alkaline Phosphatase 66 39 - 117 IU/L   AST 14 0 - 40 IU/L   ALT 34 0 - 44 IU/L      Assessment & Plan:   Problem List Items Addressed This Visit      Endocrine   Type 2 diabetes mellitus ([place]) - Primary    Stable with A1c of 8.3- will continue current regimen for 3 months, if still running high, will start him on injectable next visit.       Relevant Orders   Bayer DCA Hb A1c Waived     Other   Anxiety disorder    Doing well. Will come off the wellbutrin. Call if starting to feel worse.        Other Visit Diagnoses    Need for diphtheria-tetanus-pertussis (Tdap) vaccine       Tdap given today.   Relevant Orders   Tdap vaccine greater than or equal to 7yo IM (Completed)       Follow up plan: Return in about 3 months (around 02/15/2019).

## 2018-11-15 NOTE — Assessment & Plan Note (Signed)
Stable with A1c of 8.3- will continue current regimen for 3 months, if still running high, will start him on injectable next visit.

## 2018-12-14 ENCOUNTER — Other Ambulatory Visit: Payer: Self-pay | Admitting: Family Medicine

## 2018-12-14 NOTE — Telephone Encounter (Signed)
Requested medication (s) are due for refill today: yes  Requested medication (s) are on the active medication list: yes  Last refill:  07/2018  Future visit scheduled: yes  Notes to clinic: looks like medication was changed and not sure the different and Qty   Requested Prescriptions  Pending Prescriptions Disp Refills   JARDIANCE 10 MG TABS tablet [Pharmacy Med Name: JARDIANCE 10 MG TABLET]  0    Sig: Please specify directions, refills and quantity     Endocrinology:  Diabetes - SGLT2 Inhibitors Failed - 12/14/2018  1:06 PM      Failed - LDL in normal range and within 360 days    LDL Calculated  Date Value Ref Range Status  08/19/2018 131 (H) 0 - 99 mg/dL Final         Failed - HBA1C is between 0 and 7.9 and within 180 days    Hemoglobin A1C  Date Value Ref Range Status  04/16/2016 8.4  Final   HB A1C (BAYER DCA - WAIVED)  Date Value Ref Range Status  11/15/2018 8.3 (H) <7.0 % Final    Comment:                                          Diabetic Adult            <7.0                                       Healthy Adult        4.3 - 5.7                                                           (DCCT/NGSP) American Diabetes Association's Summary of Glycemic Recommendations for Adults with Diabetes: Hemoglobin A1c <7.0%. More stringent glycemic goals (A1c <6.0%) may further reduce complications at the cost of increased risk of hypoglycemia.          Passed - Cr in normal range and within 360 days    Creatinine, Ser  Date Value Ref Range Status  08/19/2018 0.98 0.76 - 1.27 mg/dL Final         Passed - eGFR in normal range and within 360 days    GFR calc Af Amer  Date Value Ref Range Status  08/19/2018 104 >59 mL/min/1.73 Final   GFR calc non Af Amer  Date Value Ref Range Status  08/19/2018 90 >59 mL/min/1.73 Final         Passed - Valid encounter within last 6 months    Recent Outpatient Visits          4 weeks ago Type 2 diabetes mellitus without complication,  without long-term current use of insulin ([place])   [place], Megan P, DO   4 months ago Type 2 diabetes mellitus without complication, without long-term current use of insulin ([place])   [place], Megan P, DO   4 months ago Contact dermatitis, unspecified contact dermatitis type, unspecified trigger   [employer], Megan P, DO   7 months ago Type 2 diabetes mellitus without complication,  without long-term current use of insulin ([facility])   [facility] Johnson, Megan P, DO   10 months ago Routine general medical examination at a health care facility   [facility] Johnson, Megan P, DO      Future Appointments            In 2 months Johnson, Megan P, DO [facility]               

## 2018-12-14 NOTE — Telephone Encounter (Signed)
They say jardiance is not covered. Please have them tell us what is.

## 2018-12-14 NOTE — Telephone Encounter (Signed)
Tried calling pharmacy, was on hold for 8 minutes. Will try to call back.

## 2019-02-20 ENCOUNTER — Ambulatory Visit: Payer: BC Managed Care – PPO | Admitting: Family Medicine

## 2019-02-21 ENCOUNTER — Encounter: Payer: Self-pay | Admitting: Family Medicine

## 2019-02-21 ENCOUNTER — Ambulatory Visit (INDEPENDENT_AMBULATORY_CARE_PROVIDER_SITE_OTHER): Payer: BC Managed Care – PPO | Admitting: Family Medicine

## 2019-02-21 ENCOUNTER — Other Ambulatory Visit: Payer: Self-pay

## 2019-02-21 DIAGNOSIS — E782 Mixed hyperlipidemia: Secondary | ICD-10-CM

## 2019-02-21 DIAGNOSIS — E119 Type 2 diabetes mellitus without complications: Secondary | ICD-10-CM | POA: Diagnosis not present

## 2019-02-21 DIAGNOSIS — F411 Generalized anxiety disorder: Secondary | ICD-10-CM

## 2019-02-21 MED ORDER — ALPRAZOLAM 0.5 MG PO TABS: 0.5000 mg | ORAL_TABLET | Freq: Two times a day (BID) | ORAL | 1 refills | Status: DC | PRN

## 2019-02-21 MED ORDER — ESCITALOPRAM OXALATE 20 MG PO TABS: 20.0000 mg | ORAL_TABLET | Freq: Every day | ORAL | 1 refills | Status: DC

## 2019-02-21 MED ORDER — BUPROPION HCL ER (XL) 150 MG PO TB24: 150.0000 mg | ORAL_TABLET | Freq: Every day | ORAL | 1 refills | Status: DC

## 2019-02-21 MED ORDER — TRAZODONE HCL 100 MG PO TABS: ORAL_TABLET | ORAL | 1 refills | Status: DC

## 2019-02-21 MED ORDER — METFORMIN HCL ER 500 MG PO TB24: 1000.0000 mg | ORAL_TABLET | Freq: Two times a day (BID) | ORAL | 1 refills | Status: DC

## 2019-02-21 NOTE — Progress Notes (Signed)
There were no vitals taken for this visit.   Subjective:    Patient ID: Glenn Flynn, male    DOB: 04-Nov-1970, 48 y.o.   MRN: [id number]  HPI: Glenn Flynn is a 48 y.o. male  Chief Complaint  Patient presents with  . Anxiety  . Hyperlipidemia  . Diabetes   DIABETES- did not start jardiance due to cost Hypoglycemic episodes:no Polydipsia/polyuria: no Visual disturbance: no Chest pain: no Paresthesias: no Glucose Monitoring: no  Accucheck frequency: Not Checking Taking Insulin?: no Blood Pressure Monitoring: not checking Retinal Examination: Up to Date Foot Exam: Up to Date Diabetic Education: Completed Pneumovax: Up to Date Influenza: Not up to Date Aspirin: no  HYPERLIPIDEMIA Hyperlipidemia status: stable Satisfied with current treatment?  yes Past cholesterol meds: none Supplements: none Aspirin:  no The ASCVD Risk score [name], et al., 2013) failed to calculate for the following reasons:   The patient has a prior MI or stroke diagnosis Chest pain:  no Coronary artery disease:  no  ANXIETY/STRESS Duration:stable Anxious mood: yes  Excessive worrying: no Irritability: no  Sweating: no Nausea: no Palpitations:no Hyperventilation: no Panic attacks: no Agoraphobia: no  Obscessions/compulsions: no Depressed mood: no Depression screen [facility] 2/9 02/21/2019 08/15/2018 02/09/2018 02/22/2017 01/22/2017  Decreased Interest 0 0 1 1 1   Down, Depressed, Hopeless 0 0 1 1 1   PHQ - 2 Score 0 0 2 2 2   Altered sleeping 3 3 2  - 1  Tired, decreased energy 1 3 1  - 2  Change in appetite 0 0 0 - 0  Feeling bad or failure about yourself  0 0 0 - 1  Trouble concentrating 0 0 1 - 1  Moving slowly or fidgety/restless 0 0 0 - 0  Suicidal thoughts 0 0 0 - 0  PHQ-9 Score 4 6 6  - 7  Difficult doing work/chores Not difficult at all Not difficult at all Somewhat difficult - Somewhat difficult   Anhedonia: no Weight changes: no Insomnia: yes hard to fall asleep   Hypersomnia: no Fatigue/loss of energy: no Feelings of worthlessness: no Feelings of guilt: no Impaired concentration/indecisiveness: no Suicidal ideations: no  Crying spells: no Recent Stressors/Life Changes: no   Relationship problems: no   Family stress: no     Financial stress: no    Job stress: no    Recent death/loss: no  Relevant past medical, surgical, family and social history reviewed and updated as indicated. Interim medical history since our last visit reviewed. Allergies and medications reviewed and updated.  Review of Systems  Constitutional: Negative.   Respiratory: Negative.   Cardiovascular: Negative.   Musculoskeletal: Negative.   Neurological: Negative.   Psychiatric/Behavioral: Negative.     Per HPI unless specifically indicated above     Objective:    There were no vitals taken for this visit.  Wt Readings from Last 3 Encounters:  11/15/18 242 lb (109.8 kg)  08/19/18 242 lb (109.8 kg)  05/12/18 248 lb (112.5 kg)    Physical Exam Vitals signs and nursing note reviewed.  Constitutional:      General: He is not in acute distress.    Appearance: Normal appearance. He is not ill-appearing, toxic-appearing or diaphoretic.  HENT:     Head: Normocephalic and atraumatic.     Right Ear: External ear normal.     Left Ear: External ear normal.     Nose: Nose normal.     Mouth/Throat:     Mouth: Mucous membranes are moist.  Pharynx: Oropharynx is clear.  Eyes:     General: No scleral icterus.       Right eye: No discharge.        Left eye: No discharge.     Conjunctiva/sclera: Conjunctivae normal.     Pupils: Pupils are equal, round, and reactive to light.  Neck:     Musculoskeletal: Normal range of motion.  Pulmonary:     Effort: Pulmonary effort is normal. No respiratory distress.     Comments: Speaking in full sentences Musculoskeletal: Normal range of motion.  Skin:    Coloration: Skin is not jaundiced or pale.     Findings: No bruising,  erythema, lesion or rash.  Neurological:     Mental Status: He is alert and oriented to person, place, and time. Mental status is at baseline.  Psychiatric:        Mood and Affect: Mood normal.        Behavior: Behavior normal.        Thought Content: Thought content normal.        Judgment: Judgment normal.     Results for orders placed or performed in visit on 11/15/18  Bayer DCA Hb A1c Waived  Result Value Ref Range   HB A1C (BAYER DCA - WAIVED) 8.3 (H) <7.0 %      Assessment & Plan:   Problem List Items Addressed This Visit      Endocrine   Type 2 diabetes mellitus ([place]) - Primary    Has not been able to afford jardiance. Has not been checking his sugars. We don't expect them to be significantly better. Will work on getting him some help and check labs ASAP. Labs to be drawn ASAP. Call with any concerns.       Relevant Medications   metFORMIN (GLUCOPHAGE-XR) 500 MG 24 hr tablet   Other Relevant Orders   Bayer DCA Hb A1c Waived   Comprehensive metabolic panel   CBC with Differential/Platelet   Microalbumin, Urine Waived   UA/M w/rflx Culture, Routine   Referral to Chronic Care Management Services     Other   Hyperlipidemia    Under good control on current regimen. Continue current regimen. Continue to monitor. Call with any concerns. Refills given. Labs to be drawn ASAP.       Relevant Orders   Comprehensive metabolic panel   Lipid Panel w/o Chol/HDL Ratio   CBC with Differential/Platelet   Anxiety disorder    Under good control on current regimen. Continue current regimen. Continue to monitor. Call with any concerns. Refills given- xanax should last 6 months.        Relevant Medications   escitalopram (LEXAPRO) 20 MG tablet   ALPRAZolam (XANAX) 0.5 MG tablet   traZODone (DESYREL) 100 MG tablet   buPROPion (WELLBUTRIN XL) 150 MG 24 hr tablet   Other Relevant Orders   Comprehensive metabolic panel   CBC with Differential/Platelet   TSH       Follow up  plan: Return in about 3 months (around 05/24/2019).   . This visit was completed via [employer] due to the restrictions of the COVID-19 pandemic. All issues as above were discussed and addressed. Physical exam was done as above through visual confirmation on D[employer]imity. If it was felt that the patient should be evaluated in the office, they were directed there. The patient verbally consented to this visit. . Location of the patient: home . Location of the provider: home . Those involved with this call:  .  Provider: Park Liter, DO . CMA: Tiffany Reel, CMA . Front Desk/Registration: Don Perking  . Time spent on call: 25 minutes with patient face to face via video conference. More than 50% of this time was spent in counseling and coordination of care. 40 minutes total spent in review of patient's record and preparation of their chart.

## 2019-02-21 NOTE — Assessment & Plan Note (Signed)
Has not been able to afford jardiance. Has not been checking his sugars. We don't expect them to be significantly better. Will work on getting him some help and check labs ASAP. Labs to be drawn ASAP. Call with any concerns.

## 2019-02-21 NOTE — Assessment & Plan Note (Signed)
Under good control on current regimen. Continue current regimen. Continue to monitor. Call with any concerns. Refills given. Labs to be drawn ASAP.   

## 2019-02-21 NOTE — Assessment & Plan Note (Signed)
Under good control on current regimen. Continue current regimen. Continue to monitor. Call with any concerns. Refills given- xanax should last 6 months.

## 2019-02-22 ENCOUNTER — Ambulatory Visit: Payer: Self-pay | Admitting: Pharmacist

## 2019-02-22 ENCOUNTER — Encounter: Payer: Self-pay | Admitting: Pharmacist

## 2019-02-22 DIAGNOSIS — E119 Type 2 diabetes mellitus without complications: Secondary | ICD-10-CM

## 2019-02-22 MED ORDER — STEGLATRO 15 MG PO TABS: 15.0000 mg | ORAL_TABLET | Freq: Every day | ORAL | 6 refills | Status: DC

## 2019-02-22 NOTE — Progress Notes (Deleted)
Clinical ASCVD: {YES/NO:21197} The ASCVD Risk score [name], et al., 2013) failed to calculate for the following reasons:   The patient has a prior MI or stroke diagnosis

## 2019-02-22 NOTE — Addendum Note (Signed)
Addended by: Valerie Roys on: 02/22/2019 10:47 AM   Modules accepted: Orders

## 2019-02-22 NOTE — Chronic Care Management (AMB) (Signed)
Chronic Care Management   Note  02/22/2019 Name: Glenn Flynn MRN: [id number] DOB: 1970/09/04   Subjective:  Glenn Flynn is a 48 y.o. year old male who is a primary care patient of Valerie Roys, DO. The CCM team was consulted for assistance with chronic disease management and care coordination needs.    Contacted patient to discuss medication access needs.   Review of patient status, including review of consultants reports, laboratory and other test data, was performed as part of comprehensive evaluation and provision of chronic care management services.   Objective:  Lab Results  Component Value Date   CREATININE 0.98 08/19/2018   CREATININE 0.92 02/20/2018   CREATININE 0.97 02/09/2018    Lab Results  Component Value Date   HGBA1C 8.3 (H) 11/15/2018       Component Value Date/Time   CHOL 216 (H) 08/19/2018 0825   TRIG 204 (H) 08/19/2018 0825   HDL 44 08/19/2018 0825   LDLCALC 131 (H) 08/19/2018 0825    Clinical ASCVD: No - dx NSTEMI, but clean LHC 10 year ASCVD risk 8.1%  BP Readings from Last 3 Encounters:  11/15/18 126/79  08/19/18 138/84  05/12/18 114/64    No Known Allergies  Medications Reviewed Today    Reviewed by Valerie Roys, DO (Physician) on 02/21/19 at [phone]  Med List Status: <None>  Medication Order Taking? Sig Documenting Provider Last Dose Status Informant  ALPRAZolam (XANAX) 0.5 MG tablet [id number] Yes Take 1 tablet (0.5 mg total) by mouth 2 (two) times daily as needed for anxiety. Johnson, Megan P, DO  Active   buPROPion (WELLBUTRIN XL) 150 MG 24 hr tablet [id number]  Take 1 tablet (150 mg total) by mouth daily. [place], Megan P, DO  Active   empagliflozin (JARDIANCE) 10 MG TABS tablet [id number] No Take 10 mg by mouth daily.  Patient not taking: Reported on 02/21/2019   Valerie Roys, DO Not Taking Active   escitalopram (LEXAPRO) 20 MG tablet [id number] Yes Take 1 tablet (20 mg total) by mouth at bedtime. [place], Megan P, DO   Active   metFORMIN (GLUCOPHAGE-XR) 500 MG 24 hr tablet [id number] Yes Take 2 tablets (1,000 mg total) by mouth 2 (two) times daily. Johnson, Megan P, DO  Active   Multiple Vitamins-Minerals (MULTIVITAMIN WITH MINERALS) tablet [id number] No Take 1 tablet by mouth daily. [provider] Not Taking Active Other  traZODone (DESYREL) 100 MG tablet [id number] Yes TAKE 1 TABLET BY MOUTH EVERYDAY AT BEDTIME Johnson, Megan P, DO  Active   triamcinolone ointment (KENALOG) 0.5 % [id number] Yes Apply 1 application topically 2 (two) times daily. Valerie Roys, DO Taking Active            Assessment:   Goals Addressed            This Visit's Progress     Patient Stated   . PharmD "I can't afford this medication" (pt-stated)       Current Barriers:  . Diabetes: uncontrolled; most recent A1c 7.8%   . Current antihyperglycemic regimen: metformin XR 1000 mg BID o Prescribed Jardiance, unable to afford o Previous hx Steglatro . Cardiovascular risk reduction: o Current hypertensive regimen: none o Current hyperlipidemia regimen: none; documented dx of NSTEMI in 02/2018, however, LHC showed clean coronaries w/ no athlerosclerosis. Calculated ASCVD risk score of 8.1%; last LDL 161, updated lab work pending  Pharmacist Clinical Goal(s):  Marland Kitchen Over the next 90 days, patient will work with PharmD and primary care  provider to address optimized glycemic management  Interventions: . Comprehensive medication review performed, medication list updated in electronic medical record . Contacted [employer]BS. Jardiance would be $514/30 day supply, as patient has a $2200 prescription deductible for higher tier medications. Jardiance coupon card would be application, but only pays a max of $250/30 days, so medication would still be unaffordable.  Anastasio Auerbach and Farxiga coupon cards, as well as GLP1 coupon cards, have similar max benefit. However, Steglatro coupon card pays up to $583/30 days. Patient may have had some  adherence concerns when previously on Steglatro 5 mg daily. Collaborated w/ Dr. Wynetta Emery to send Steglatro 15 mg daily, and will collaborate w/ patient for re-signing up for [facility] coupon card.  . Moving forward, consider moderate intensity statin for ASCVD risk reduction  Patient Self Care Activities:  . Patient will check blood glucose regularly, document, and provide at future appointments . Patient will take medications as prescribed . Patient will report any questions or concerns to provider   Initial goal documentation        Plan: - Will outreach patient in the next 2-3 business days to ensure medication access needs met  Catie Darnelle Maffucci, PharmD Clinical Pharmacist [place] [phone]

## 2019-02-22 NOTE — Patient Instructions (Signed)
Visit Information  Goals Addressed            This Visit's Progress     Patient Stated   . PharmD "I can't afford this medication" (pt-stated)       Current Barriers:  . Diabetes: uncontrolled; most recent A1c 7.8%   . Current antihyperglycemic regimen: metformin XR 1000 mg BID o Prescribed Jardiance, unable to afford o Previous hx Steglatro . Cardiovascular risk reduction: o Current hypertensive regimen: none o Current hyperlipidemia regimen: none; documented dx of NSTEMI in 02/2018, however, LHC showed clean coronaries w/ no athlerosclerosis. Calculated ASCVD risk score of 8.1%; last LDL 161, updated lab work pending  Pharmacist Clinical Goal(s):  Marland Kitchen Over the next 90 days, patient will work with PharmD and primary care provider to address optimized glycemic management  Interventions: . Comprehensive medication review performed, medication list updated in electronic medical record . Contacted [employer]. Jardiance would be $514/30 day supply, as patient has a $2200 prescription deductible for higher tier medications. Jardiance coupon card would be application, but only pays a max of $250/30 days, so medication would still be unaffordable.  Anastasio Auerbach and Farxiga coupon cards, as well as GLP1 coupon cards, have similar max benefit. However, Steglatro coupon card pays up to $583/30 days. Patient may have had some adherence concerns when previously on Steglatro 5 mg daily. Collaborated w/ Dr. Wynetta Emery to send Steglatro 15 mg daily, and will collaborate w/ patient for re-signing up for [facility] coupon card.  . Moving forward, consider moderate intensity statin for ASCVD risk reduction  Patient Self Care Activities:  . Patient will check blood glucose regularly, document, and provide at future appointments . Patient will take medications as prescribed . Patient will report any questions or concerns to provider   Initial goal documentation        The patient verbalized understanding of  instructions provided today and declined a print copy of patient instruction materials.   Plan: - Will outreach patient in the next 2-3 business days to ensure medication access needs met  Catie Darnelle Maffucci, PharmD Clinical Pharmacist [place] [phone]

## 2019-03-22 ENCOUNTER — Encounter: Payer: Self-pay | Admitting: Family Medicine

## 2019-03-24 ENCOUNTER — Other Ambulatory Visit: Payer: Self-pay

## 2019-03-24 ENCOUNTER — Inpatient Hospital Stay
Admission: EM | Admit: 2019-03-24 | Discharge: 2019-03-27 | DRG: 282 | Disposition: A | Discharge status: Home / Self Care | Payer: BC Managed Care – PPO | Attending: Internal Medicine | Admitting: Internal Medicine

## 2019-03-24 ENCOUNTER — Emergency Department: Payer: BC Managed Care – PPO

## 2019-03-24 DIAGNOSIS — F419 Anxiety disorder, unspecified: Secondary | ICD-10-CM | POA: Diagnosis not present

## 2019-03-24 DIAGNOSIS — E785 Hyperlipidemia, unspecified: Secondary | ICD-10-CM | POA: Diagnosis present

## 2019-03-24 DIAGNOSIS — I10 Essential (primary) hypertension: Secondary | ICD-10-CM | POA: Diagnosis present

## 2019-03-24 DIAGNOSIS — Z833 Family history of diabetes mellitus: Secondary | ICD-10-CM | POA: Diagnosis not present

## 2019-03-24 DIAGNOSIS — E669 Obesity, unspecified: Secondary | ICD-10-CM | POA: Diagnosis not present

## 2019-03-24 DIAGNOSIS — G4700 Insomnia, unspecified: Secondary | ICD-10-CM | POA: Diagnosis not present

## 2019-03-24 DIAGNOSIS — I4510 Unspecified right bundle-branch block: Secondary | ICD-10-CM | POA: Diagnosis not present

## 2019-03-24 DIAGNOSIS — Z789 Other specified health status: Secondary | ICD-10-CM

## 2019-03-24 DIAGNOSIS — F329 Major depressive disorder, single episode, unspecified: Secondary | ICD-10-CM

## 2019-03-24 DIAGNOSIS — R079 Chest pain, unspecified: Secondary | ICD-10-CM | POA: Diagnosis not present

## 2019-03-24 DIAGNOSIS — R7989 Other specified abnormal findings of blood chemistry: Secondary | ICD-10-CM | POA: Diagnosis not present

## 2019-03-24 DIAGNOSIS — E1165 Type 2 diabetes mellitus with hyperglycemia: Secondary | ICD-10-CM | POA: Diagnosis not present

## 2019-03-24 DIAGNOSIS — Z20828 Contact with and (suspected) exposure to other viral communicable diseases: Secondary | ICD-10-CM | POA: Diagnosis not present

## 2019-03-24 DIAGNOSIS — E119 Type 2 diabetes mellitus without complications: Secondary | ICD-10-CM

## 2019-03-24 DIAGNOSIS — I214 Non-ST elevation (NSTEMI) myocardial infarction: Secondary | ICD-10-CM

## 2019-03-24 DIAGNOSIS — R0789 Other chest pain: Secondary | ICD-10-CM | POA: Diagnosis not present

## 2019-03-24 DIAGNOSIS — Z8249 Family history of ischemic heart disease and other diseases of the circulatory system: Secondary | ICD-10-CM

## 2019-03-24 DIAGNOSIS — F418 Other specified anxiety disorders: Secondary | ICD-10-CM | POA: Diagnosis present

## 2019-03-24 DIAGNOSIS — Z79899 Other long term (current) drug therapy: Secondary | ICD-10-CM

## 2019-03-24 DIAGNOSIS — Z7984 Long term (current) use of oral hypoglycemic drugs: Secondary | ICD-10-CM | POA: Diagnosis not present

## 2019-03-24 DIAGNOSIS — F4000 Agoraphobia, unspecified: Secondary | ICD-10-CM | POA: Diagnosis not present

## 2019-03-24 DIAGNOSIS — I209 Angina pectoris, unspecified: Secondary | ICD-10-CM | POA: Diagnosis not present

## 2019-03-24 DIAGNOSIS — Z6834 Body mass index (BMI) 34.0-34.9, adult: Secondary | ICD-10-CM

## 2019-03-24 DIAGNOSIS — Z23 Encounter for immunization: Secondary | ICD-10-CM | POA: Diagnosis not present

## 2019-03-24 HISTORY — DX: Non-ST elevation (NSTEMI) myocardial infarction: I21.4

## 2019-03-24 LAB — CBC
HCT: 46.1 % (ref 39.0–52.0)
Hemoglobin: 16 g/dL (ref 13.0–17.0)
MCH: 29.7 pg (ref 26.0–34.0)
MCHC: 34.7 g/dL (ref 30.0–36.0)
MCV: 85.5 fL (ref 80.0–100.0)
Platelets: 295 10*3/uL (ref 150–400)
RBC: 5.39 MIL/uL (ref 4.22–5.81)
RDW: 12.5 % (ref 11.5–15.5)
WBC: 7.7 10*3/uL (ref 4.0–10.5)
nRBC: 0 % (ref 0.0–0.2)

## 2019-03-24 LAB — BASIC METABOLIC PANEL
Anion gap: 11 (ref 5–15)
BUN: 16 mg/dL (ref 6–20)
CO2: 24 mmol/L (ref 22–32)
Calcium: 9.5 mg/dL (ref 8.9–10.3)
Chloride: 101 mmol/L (ref 98–111)
Creatinine, Ser: 0.8 mg/dL (ref 0.61–1.24)
GFR calc Af Amer: >60 mL/min (ref >60)
GFR calc non Af Amer: >60 mL/min (ref >60)
Glucose, Bld: 277 mg/dL — ABNORMAL HIGH (ref 70–99)
Potassium: 4.5 mmol/L (ref 3.5–5.1)
Sodium: 136 mmol/L (ref 135–145)

## 2019-03-24 LAB — TROPONIN I (HIGH SENSITIVITY)
Troponin I (High Sensitivity): 11 ng/L (ref <18)
Troponin I (High Sensitivity): 362 ng/L (ref <18)

## 2019-03-24 LAB — APTT: aPTT: 24 seconds (ref 24–36)

## 2019-03-24 LAB — PROTIME-INR
INR: 1 (ref 0.8–1.2)
Prothrombin Time: 12.7 seconds (ref 11.4–15.2)

## 2019-03-24 MED ORDER — ALPRAZOLAM 0.5 MG PO TABS: 0.5000 mg | ORAL_TABLET | Freq: Two times a day (BID) | ORAL | Status: DC | PRN

## 2019-03-24 MED ORDER — MORPHINE SULFATE (PF) 2 MG/ML IV SOLN: 2.0000 mg | INTRAVENOUS | Status: DC | PRN

## 2019-03-24 MED ORDER — HEPARIN (PORCINE) 25000 UT/250ML-% IV SOLN
1850.0000 [IU]/h | INTRAVENOUS | Status: DC
  Administered 2019-03-24: 22:00:00 1400 [IU]/h via INTRAVENOUS
  Administered 2019-03-25: 2100 [IU]/h via INTRAVENOUS
  Administered 2019-03-25: 1700 [IU]/h via INTRAVENOUS
  Administered 2019-03-26: 1900 [IU]/h via INTRAVENOUS
  Administered 2019-03-27: 1850 [IU]/h via INTRAVENOUS
  Filled 2019-03-24 (×7): qty 250

## 2019-03-24 MED ORDER — ZOLPIDEM TARTRATE 5 MG PO TABS
5.0000 mg | ORAL_TABLET | Freq: Every evening | ORAL | Status: DC | PRN
  Filled 2019-03-24: qty 1

## 2019-03-24 MED ORDER — TRAZODONE HCL 100 MG PO TABS
100.0000 mg | ORAL_TABLET | Freq: Every day | ORAL | Status: DC
  Administered 2019-03-25 – 2019-03-26 (×3): 100 mg via ORAL
  Filled 2019-03-24 (×3): qty 1

## 2019-03-24 MED ORDER — ADULT MULTIVITAMIN W/MINERALS CH
1.0000 | ORAL_TABLET | Freq: Every day | ORAL | Status: DC
  Administered 2019-03-25 – 2019-03-26 (×2): 1 via ORAL
  Filled 2019-03-24 (×2): qty 1

## 2019-03-24 MED ORDER — NITROGLYCERIN 0.4 MG/SPRAY TL SOLN: 1.0000 | Status: DC | PRN

## 2019-03-24 MED ORDER — ASPIRIN 81 MG PO CHEW: 324.0000 mg | CHEWABLE_TABLET | ORAL | Status: DC

## 2019-03-24 MED ORDER — HEPARIN BOLUS VIA INFUSION
4000.0000 [IU] | Freq: Once | INTRAVENOUS | Status: AC
  Administered 2019-03-24: 4000 [IU] via INTRAVENOUS
  Filled 2019-03-24: qty 4000

## 2019-03-24 MED ORDER — ASPIRIN EC 81 MG PO TBEC
81.0000 mg | DELAYED_RELEASE_TABLET | Freq: Every day | ORAL | Status: DC
  Administered 2019-03-25 – 2019-03-26 (×2): 81 mg via ORAL
  Filled 2019-03-24 (×2): qty 1

## 2019-03-24 MED ORDER — NITROGLYCERIN 0.4 MG SL SUBL: 0.4000 mg | SUBLINGUAL_TABLET | SUBLINGUAL | Status: DC | PRN

## 2019-03-24 MED ORDER — ERTUGLIFLOZIN L-PYROGLUTAMICAC 15 MG PO TABS: 15.0000 mg | ORAL_TABLET | Freq: Every day | ORAL | Status: DC

## 2019-03-24 MED ORDER — SODIUM CHLORIDE 0.9 % IV SOLN
INTRAVENOUS | Status: DC
  Administered 2019-03-24: via INTRAVENOUS

## 2019-03-24 MED ORDER — METOPROLOL TARTRATE 25 MG PO TABS
12.5000 mg | ORAL_TABLET | Freq: Two times a day (BID) | ORAL | Status: DC
  Administered 2019-03-25: 12.5 mg via ORAL
  Filled 2019-03-24: qty 1

## 2019-03-24 MED ORDER — ESCITALOPRAM OXALATE 10 MG PO TABS
20.0000 mg | ORAL_TABLET | Freq: Every day | ORAL | Status: DC
  Administered 2019-03-25 – 2019-03-26 (×3): 20 mg via ORAL
  Filled 2019-03-24 (×4): qty 2

## 2019-03-24 MED ORDER — ASPIRIN 81 MG PO CHEW
324.0000 mg | CHEWABLE_TABLET | Freq: Once | ORAL | Status: AC
  Administered 2019-03-24: 324 mg via ORAL
  Filled 2019-03-24: qty 4

## 2019-03-24 MED ORDER — ATORVASTATIN CALCIUM 80 MG PO TABS
80.0000 mg | ORAL_TABLET | Freq: Every day | ORAL | Status: DC
  Administered 2019-03-25 – 2019-03-26 (×2): 80 mg via ORAL
  Filled 2019-03-24 (×2): qty 1

## 2019-03-24 MED ORDER — ACETAMINOPHEN 325 MG PO TABS: 650.0000 mg | ORAL_TABLET | ORAL | Status: DC | PRN

## 2019-03-24 MED ORDER — ASPIRIN 300 MG RE SUPP: 300.0000 mg | RECTAL | Status: DC

## 2019-03-24 MED ORDER — ONDANSETRON HCL 4 MG/2ML IJ SOLN: 4.0000 mg | Freq: Four times a day (QID) | INTRAMUSCULAR | Status: DC | PRN

## 2019-03-24 MED ORDER — INSULIN ASPART 100 UNIT/ML ~~LOC~~ SOLN
0.0000 [IU] | Freq: Four times a day (QID) | SUBCUTANEOUS | Status: DC
  Administered 2019-03-25: 3 [IU] via SUBCUTANEOUS
  Administered 2019-03-25: 5 [IU] via SUBCUTANEOUS
  Administered 2019-03-25: 8 [IU] via SUBCUTANEOUS
  Administered 2019-03-25: 3 [IU] via SUBCUTANEOUS
  Administered 2019-03-25: 5 [IU] via SUBCUTANEOUS
  Administered 2019-03-26: 8 [IU] via SUBCUTANEOUS
  Administered 2019-03-26 – 2019-03-27 (×4): 3 [IU] via SUBCUTANEOUS
  Filled 2019-03-24 (×9): qty 1

## 2019-03-24 MED ORDER — BUPROPION HCL ER (XL) 150 MG PO TB24
150.0000 mg | ORAL_TABLET | Freq: Every day | ORAL | Status: DC
  Administered 2019-03-25 – 2019-03-26 (×2): 150 mg via ORAL
  Filled 2019-03-24 (×2): qty 1

## 2019-03-24 NOTE — Progress Notes (Signed)
ANTICOAGULATION CONSULT NOTE   Pharmacy Consult for Heparin drip Indication: chest pain/ACS  No Known Allergies  Patient Measurements: Height: 5\' 11"  (180.3 cm) Weight: 250 lb (113.4 kg) IBW/kg (Calculated) : 75.3 Heparin Dosing Weight: 99.9 kg  Vital Signs: Temp: 98.5 F (36.9 C) (12/04 1620) Temp Source: Oral (12/04 1620) BP: 140/79 (12/04 1620) Pulse Rate: 70 (12/04 1620)  Labs: Recent Labs    03/24/19 1627 03/24/19 1924  HGB 16.0  --   HCT 46.1  --   PLT 295  --   CREATININE 0.80  --   TROPONINIHS 11 362*    Estimated Creatinine Clearance: 141.4 mL/min (by C-G formula based on SCr of 0.8 mg/dL).   Medical History: Past Medical History:  Diagnosis Date  . Agoraphobia   . Allergy   . Anxiety   . Depression   . Diabetes mellitus without complication ([place])   . Hyperlipidemia   . Migraines     Medications:  Scheduled:  . aspirin  324 mg Oral Once  . aspirin  324 mg Oral NOW   Or  . aspirin  300 mg Rectal NOW  . [START ON 03/25/2019] aspirin EC  81 mg Oral Daily  . atorvastatin  80 mg Oral q1800  . [START ON 03/25/2019] buPROPion  150 mg Oral Daily  . [START ON 03/25/2019] Ertugliflozin L-PyroglutamicAc  15 mg Oral QAC breakfast  . escitalopram  20 mg Oral QHS  . heparin  4,000 Units Intravenous Once  . metoprolol tartrate  12.5 mg Oral BID  . multivitamin with minerals  1 tablet Oral Daily  . traZODone  100 mg Oral QHS   Infusions:  . sodium chloride    . heparin      Assessment: 48 yo M to start Heparin drip for ACS/STEMI. No anticoag PTA per Med Rec Hgb 16.0  Plt 295  APTT pending  INR pending  Goal of Therapy:  Heparin level 0.3-0.7 units/ml Monitor platelets by anticoagulation protocol: Yes   Plan:  Give 4000 units bolus x 1 Start heparin infusion at 1400 units/hr Check anti-Xa level in 6 hours and daily while on heparin Continue to monitor H&H and platelets  Glorine Hanratty A 03/24/2019,8:38 PM

## 2019-03-24 NOTE — H&P (Addendum)
[facility] NAME: Glenn Flynn    MR#:  [id number]  DATE OF BIRTH:  10/21/1970  DATE OF ADMISSION:  03/24/2019  PRIMARY CARE PHYSICIAN: Valerie Roys, DO   REQUESTING/REFERRING PHYSICIAN: Delman Kitten, MD  CHIEF COMPLAINT:   Chief Complaint  Patient presents with  . Chest Pain    HISTORY OF PRESENT ILLNESS:  Glenn Flynn  is a 48 y.o. Caucasian male with a known history of diabetes mellitus, dyslipidemia, depression and anxiety, who presented to the emergency room with the onset of midsternal chest pain felt as pressure and graded 7-8/10 in severity with radiation to his right side of the neck.  He denied any associated nausea vomiting or diaphoresis.  No palpitations.  He stated he may have felt mild dyspnea from anxiety.  He denied any recent leg pain or edema recent travels or surgeries.  No fever or chills.  No cough or hemoptysis.  No recent sick exposure to COVID-19.  Upon presentation to the emergency room, vital signs were within normal.  Labs revealed a blood glucose of 277 and initial high-sensitivity troponin I of 11 and within 3 hours went up to 362.  Initial EKG showed normal sinus rhythm with a rate of 65 with Q waves and inverted T waves inferiorly and incomplete right bundle branch block.  Repeat EKG showed normal sinus rhythm rate of 63 without significant changes.  COVID-19 test is currently pending.  Dr. Clayborn Bigness was notified about the patient.  The patient was given 4 baby aspirin and started on IV heparin.  He is currently pain-free.  He will be admitted to an observation telemetry bed for further evaluation and management. PAST MEDICAL HISTORY:   Past Medical History:  Diagnosis Date  . Agoraphobia   . Allergy   . Anxiety   . Depression   . Diabetes mellitus without complication ([place])   . Hyperlipidemia   . Migraines     PAST SURGICAL HISTORY:   Past Surgical History:  Procedure Laterality Date  . LEFT HEART CATH AND  CORONARY ANGIOGRAPHY N/A 02/21/2018   Procedure: LEFT HEART CATH AND CORONARY ANGIOGRAPHY;  Surgeon: Corey Skains, MD;  Location: [facility];  Service: Cardiovascular;  Laterality: N/A;  . TONSILLECTOMY  Age 21    SOCIAL HISTORY:   Social History   Tobacco Use  . Smoking status: Never Smoker  . Smokeless tobacco: Never Used  Substance Use Topics  . Alcohol use: No    Alcohol/week: 0.0 standard drinks    Comment: rare    FAMILY HISTORY:   Family History  Problem Relation Age of Onset  . Diabetes Mother   . Hypertension Mother   . Diabetes Father   . Hypertension Father   . Heart disease Maternal Grandmother   . Heart disease Maternal Grandfather   . Stroke Paternal Grandfather   . Cancer Neg Hx   . COPD Neg Hx     DRUG ALLERGIES:  No Known Allergies  REVIEW OF SYSTEMS:   ROS As per history of present illness. All pertinent systems were reviewed above. Constitutional,  HEENT, cardiovascular, respiratory, GI, GU, musculoskeletal, neuro, psychiatric, endocrine,  integumentary and hematologic systems were reviewed and are otherwise  negative/unremarkable except for positive findings mentioned above in the HPI.   MEDICATIONS AT HOME:   Prior to Admission medications   Medication Sig Start Date End Date Taking? Authorizing Provider  ALPRAZolam Duanne Moron) 0.5 MG tablet Take 1 tablet (0.5  mg total) by mouth 2 (two) times daily as needed for anxiety. 02/21/19   Johnson, Megan P, DO  buPROPion (WELLBUTRIN XL) 150 MG 24 hr tablet Take 1 tablet (150 mg total) by mouth daily. 02/21/19   Johnson, Megan P, DO  empagliflozin (JARDIANCE) 10 MG TABS tablet Take 10 mg by mouth daily. Patient not taking: Reported on 02/21/2019 12/15/18   Valerie Roys, DO  Ertugliflozin L-PyroglutamicAc (STEGLATRO) 15 MG TABS Take 15 mg by mouth daily before breakfast. 02/22/19   Park Liter P, DO  escitalopram (LEXAPRO) 20 MG tablet Take 1 tablet (20 mg total) by mouth at bedtime. 02/21/19    Johnson, Megan P, DO  metFORMIN (GLUCOPHAGE-XR) 500 MG 24 hr tablet Take 2 tablets (1,000 mg total) by mouth 2 (two) times daily. 02/21/19   Park Liter P, DO  Multiple Vitamins-Minerals (MULTIVITAMIN WITH MINERALS) tablet Take 1 tablet by mouth daily.    [provider]  traZODone (DESYREL) 100 MG tablet TAKE 1 TABLET BY MOUTH EVERYDAY AT BEDTIME 02/21/19   Johnson, Megan P, DO  triamcinolone ointment (KENALOG) 0.5 % Apply 1 application topically 2 (two) times daily. 07/18/18   Johnson, Megan P, DO      VITAL SIGNS:  Blood pressure 140/79, pulse 70, temperature 98.5 F (36.9 C), temperature source Oral, resp. rate 17, height 5\' 11"  (1.803 m), weight 113.4 kg, SpO2 100 %.  PHYSICAL EXAMINATION:  Physical Exam  GENERAL:  48 y.o.-year-old Caucasian male patient lying in the bed with no acute distress.  EYES: Pupils equal, round, reactive to light and accommodation. No scleral icterus. Extraocular muscles intact.  HEENT: Head atraumatic, normocephalic. Oropharynx and nasopharynx clear.  NECK:  Supple, no jugular venous distention. No thyroid enlargement, no tenderness.  LUNGS: Normal breath sounds bilaterally, no wheezing, rales,rhonchi or crepitation. No use of accessory muscles of respiration.  CARDIOVASCULAR: Regular rate and rhythm, S1, S2 normal. No murmurs, rubs, or gallops.  ABDOMEN: Soft, nondistended, nontender. Bowel sounds present. No organomegaly or mass.  EXTREMITIES: No pedal edema, cyanosis, or clubbing.  NEUROLOGIC: Cranial nerves II through XII are intact. Muscle strength 5/5 in all extremities. Sensation intact. Gait not checked.  PSYCHIATRIC: The patient is alert and oriented x 3.  Normal affect and good eye contact. SKIN: No obvious rash, lesion, or ulcer.   LABORATORY PANEL:   CBC Recent Labs  Lab 03/24/19 1627  WBC 7.7  HGB 16.0  HCT 46.1  PLT 295    ------------------------------------------------------------------------------------------------------------------  Chemistries  Recent Labs  Lab 03/24/19 1627  NA 136  K 4.5  CL 101  CO2 24  GLUCOSE 277*  BUN 16  CREATININE 0.80  CALCIUM 9.5   ------------------------------------------------------------------------------------------------------------------  Cardiac Enzymes No results for input(s): TROPONINI in the last 168 hours. ------------------------------------------------------------------------------------------------------------------  RADIOLOGY:  Dg Chest 2 View  Result Date: 03/24/2019 CLINICAL DATA:  Chest pain EXAM: CHEST - 2 VIEW COMPARISON:  02/20/2018 FINDINGS: The heart size and mediastinal contours are within normal limits. Both lungs are clear. The visualized skeletal structures are unremarkable. IMPRESSION: No active cardiopulmonary disease. Electronically Signed   By: Donavan Foil M.D.   On: 03/24/2019 16:57      IMPRESSION AND PLAN:   1.  Chest pain elevated troponin I, concerning for ACS/non-STEMI.  Patient will be admitted to telemetry bed.  We will follow serial troponin I's.  We will place him on IV heparin as well as aspirin, as needed sublingual nitroglycerin and morphine sulfate for pain.  We'll give him high-dose statin  therapy.  We will obtain a 2D echo and a cardiology consultation.  Dr. Clayborn Bigness is aware about the patient.  2.  Type II diabetes mellitus, currently uncontrolled.  Will obtain a hemoglobin A1c level and place the patient on supplemental coverage with NovoLog.  I'll hold off Metformin and continue his Steglatro.  3.  Dyslipidemia.  He will be on high-dose statin therapy.  Fasting lipids will be checked.  4.  Depression and anxiety.  We'll continue Lexapro, Wellbutrin XL and as needed Xanax.  5.  DVT prophylaxis.  Patient will be on IV heparin per protocol.   All the records are reviewed and case discussed with ED provider. The  plan of care was discussed in details with the patient (and family). I answered all questions. The patient agreed to proceed with the above mentioned plan. Further management will depend upon hospital course.   CODE STATUS: This was discussed with the patient and he desires to be full code  TOTAL TIME TAKING CARE OF THIS PATIENT: 50 minutes.    Christel Mormon M.D on 03/24/2019 at 8:29 PM  [facility]   From 7 PM-7 AM, contact night-coverage [email or web address]  CC: Primary care physician; Valerie Roys, DO   Note: This dictation was prepared with Dragon dictation along with smaller phrase technology. Any transcriptional errors that result from this process are unintentional.

## 2019-03-24 NOTE — ED Notes (Signed)
Date and time results received: 03/24/19 8:02 PM  Test: Troponin I Critical Value: 362  Name of Provider Notified: Dr. Jacqualine Code  Orders Received? Or Actions Taken?: see orders

## 2019-03-24 NOTE — ED Provider Notes (Signed)
[facility] Emergency Department Provider Note  ____________________________________________  Time seen: Approximately 10:15 PM  The following is a medical screening exam note. It is intended that the patient await an ER room assignment for detailed exam, diagnosis, and disposition.  I have reviewed the triage vital signs.    HISTORY  Chief Complaint Chest Pain   HPI Glenn Flynn is a 48 y.o. male that presents to the emergency department for evaluation of a burning pain to his mid chest since about 3pm.  Pain does radiate to the right side of his chest. He does still currently feel this discomfort. Patient states that he does have a history of myocarditis.  No recent illness.  Patient does not smoke.  No fever, chills, shortness of breath.  Past Medical History:  Diagnosis Date  . Agoraphobia   . Allergy   . Anxiety   . Depression   . Diabetes mellitus without complication ([place])   . Hyperlipidemia   . Migraines     Patient Active Problem List   Diagnosis Date Noted  . NSTEMI (non-ST elevated myocardial infarction) ([place]) 03/24/2019  . Chest pain 02/21/2018  . Insomnia 07/04/2015  . Controlled substance agreement signed 07/04/2015  . Fever blister 01/03/2015  . Anxiety disorder 01/03/2015  . Hyperlipidemia 10/04/2014  . Type 2 diabetes mellitus ([place]) 10/04/2014  . Morbid obesity ([place]) 10/04/2014    Past Surgical History:  Procedure Laterality Date  . LEFT HEART CATH AND CORONARY ANGIOGRAPHY N/A 02/21/2018   Procedure: LEFT HEART CATH AND CORONARY ANGIOGRAPHY;  Surgeon: Corey Skains, MD;  Location: [facility];  Service: Cardiovascular;  Laterality: N/A;  . TONSILLECTOMY  Age 49    Prior to Admission medications   Medication Sig Start Date End Date Taking? Authorizing Provider  ALPRAZolam Duanne Moron) 0.5 MG tablet Take 1 tablet (0.5 mg total) by mouth 2 (two) times daily as needed for anxiety. 02/21/19   Johnson, Megan P, DO   buPROPion (WELLBUTRIN XL) 150 MG 24 hr tablet Take 1 tablet (150 mg total) by mouth daily. 02/21/19   Johnson, Megan P, DO  empagliflozin (JARDIANCE) 10 MG TABS tablet Take 10 mg by mouth daily. Patient not taking: Reported on 02/21/2019 12/15/18   Valerie Roys, DO  Ertugliflozin L-PyroglutamicAc (STEGLATRO) 15 MG TABS Take 15 mg by mouth daily before breakfast. 02/22/19   [place] Liter P, DO  escitalopram (LEXAPRO) 20 MG tablet Take 1 tablet (20 mg total) by mouth at bedtime. 02/21/19   Johnson, Megan P, DO  metFORMIN (GLUCOPHAGE-XR) 500 MG 24 hr tablet Take 2 tablets (1,000 mg total) by mouth 2 (two) times daily. 02/21/19   [place] Liter P, DO  Multiple Vitamins-Minerals (MULTIVITAMIN WITH MINERALS) tablet Take 1 tablet by mouth daily.    [provider]  traZODone (DESYREL) 100 MG tablet TAKE 1 TABLET BY MOUTH EVERYDAY AT BEDTIME 02/21/19   Johnson, Megan P, DO  triamcinolone ointment (KENALOG) 0.5 % Apply 1 application topically 2 (two) times daily. 07/18/18   Valerie Roys, DO    Allergies Patient has no known allergies.  Family History  Problem Relation Age of Onset  . Diabetes Mother   . Hypertension Mother   . Diabetes Father   . Hypertension Father   . Heart disease Maternal Grandmother   . Heart disease Maternal Grandfather   . Stroke Paternal Grandfather   . Cancer Neg Hx   . COPD Neg Hx     Social History Social History  Tobacco Use  . Smoking status: Never Smoker  . Smokeless tobacco: Never Used  Substance Use Topics  . Alcohol use: No    Alcohol/week: 0.0 standard drinks    Comment: rare  . Drug use: No    Review of Systems Constitutional: Negative for fever. Cardiovascular: Positive for chest discomfort Respiratory: No SOB. Gastrointestinal: No abdominal pain.  No nausea, no vomiting.   Musculoskeletal: Negative for generalized body aches. Skin: Negative for rash/lesion/wound. Neurological: Negative for  headaches.  ____________________________________________   PHYSICAL EXAM:  VITAL SIGNS:  Vitals:   03/24/19 1620 03/24/19 2204  BP: 140/79 126/71  Pulse: 70 72  Resp: 17 18  Temp: 98.5 F (36.9 C)   SpO2: 100% 100%    Constitutional: Alert and oriented. No acute distress. Head: Atraumatic. Nose: No congestion/rhinnorhea. Mouth/Throat: Mucous membranes are moist. Neck: No stridor.  Cardiovascular: Good peripheral circulation. Respiratory: Normal respiratory effort. Musculoskeletal: No restriction Neurologic:  Normal speech and language. No gross focal neurologic deficits are appreciated. Speech is normal. No gait instability. Skin:  Skin is warm, dry and intact. No rash noted. Psychiatric: Mood and affect are normal. Speech and behavior are normal.  ____________________________________________   LABS (all labs ordered are listed, but only abnormal results are displayed)  Labs Reviewed  BASIC METABOLIC PANEL - Abnormal; Notable for the following components:      Result Value   Glucose, Bld 277 (*)    All other components within normal limits  TROPONIN I (HIGH SENSITIVITY) - Abnormal; Notable for the following components:   Troponin I (High Sensitivity) 362 (*)    All other components within normal limits  SARS CORONAVIRUS 2 (TAT 6-24 HRS)  CBC  PROTIME-INR  APTT  HIV ANTIBODY (ROUTINE TESTING W REFLEX)  CBC  HEMOGLOBIN A1C  HEPARIN LEVEL (UNFRACTIONATED)  TROPONIN I (HIGH SENSITIVITY)   ____________________________________________  EKG  NSR ____________________________________________   INITIAL CLINICAL IMPRESSION(S)   EKG reviewed by physician without evidence of acute STEMI or ischemia.  Chest x-ray and labs were ordered.   Laban Emperor, PA-C 03/24/19 2220    Delman Kitten, MD 03/24/19 [phone]

## 2019-03-24 NOTE — ED Triage Notes (Signed)
Pt comes in for CP starting earlier today. Pt has hx of myocarditis per pt. Pt states it is a burning and aching in the center and right side of his chest to his neck and head. VSS. Pt ambulatory to triage.

## 2019-03-24 NOTE — ED Provider Notes (Addendum)
[facility] Emergency Department Provider Note   ____________________________________________   First MD Initiated Contact with Patient 03/24/19 1934     (approximate)  I have reviewed the triage vital signs and the nursing notes.   HISTORY  Chief Complaint Chest Pain    HPI Glenn Flynn is a 48 y.o. male here for evaluation of chest pain  At about 3 PM today patient began experience a burning discomfort over his mid chest.  It is burning and aching in the center and also a little bit to the right side of the chest.  No shortness of breath no nausea vomiting.  Does report about 2 weeks ago he had a couple days of loose stools which have since improved.  He has been feeling well since, and his pain actually went away when he had his x-ray done.  Is currently not having any ongoing pain or symptoms  Denies history of known heart disease except for possible enlargement of the heart after a viral illness but reports that he was told that had improved  He denies any ongoing symptoms.  No noted exertional symptoms.  No diaphoresis.  No radiation.  No known history of coronary disease  Does have diabetes.  Non-smoker.   Past Medical History:  Diagnosis Date   Agoraphobia    Allergy    Anxiety    Depression    Diabetes mellitus without complication ([place])    Hyperlipidemia    Migraines     Patient Active Problem List   Diagnosis Date Noted   Chest pain 02/21/2018   Insomnia 07/04/2015   Controlled substance agreement signed 07/04/2015   Fever blister 01/03/2015   Anxiety disorder 01/03/2015   Hyperlipidemia 10/04/2014   Type 2 diabetes mellitus ([place]) 10/04/2014   Morbid obesity ([place]) 10/04/2014    Past Surgical History:  Procedure Laterality Date   LEFT HEART CATH AND CORONARY ANGIOGRAPHY N/A 02/21/2018   Procedure: LEFT HEART CATH AND CORONARY ANGIOGRAPHY;  Surgeon: Corey Skains, MD;  Location: [facility];   Service: Cardiovascular;  Laterality: N/A;   TONSILLECTOMY  Age 85    Prior to Admission medications   Medication Sig Start Date End Date Taking? Authorizing Provider  ALPRAZolam Duanne Moron) 0.5 MG tablet Take 1 tablet (0.5 mg total) by mouth 2 (two) times daily as needed for anxiety. 02/21/19   Johnson, Megan P, DO  buPROPion (WELLBUTRIN XL) 150 MG 24 hr tablet Take 1 tablet (150 mg total) by mouth daily. 02/21/19   Johnson, Megan P, DO  empagliflozin (JARDIANCE) 10 MG TABS tablet Take 10 mg by mouth daily. Patient not taking: Reported on 02/21/2019 12/15/18   Valerie Roys, DO  Ertugliflozin L-PyroglutamicAc (STEGLATRO) 15 MG TABS Take 15 mg by mouth daily before breakfast. 02/22/19   Park Liter P, DO  escitalopram (LEXAPRO) 20 MG tablet Take 1 tablet (20 mg total) by mouth at bedtime. 02/21/19   Johnson, Megan P, DO  metFORMIN (GLUCOPHAGE-XR) 500 MG 24 hr tablet Take 2 tablets (1,000 mg total) by mouth 2 (two) times daily. 02/21/19   Park Liter P, DO  Multiple Vitamins-Minerals (MULTIVITAMIN WITH MINERALS) tablet Take 1 tablet by mouth daily.    [provider]  traZODone (DESYREL) 100 MG tablet TAKE 1 TABLET BY MOUTH EVERYDAY AT BEDTIME 02/21/19   Johnson, Megan P, DO  triamcinolone ointment (KENALOG) 0.5 % Apply 1 application topically 2 (two) times daily. 07/18/18   Park Liter P, DO    Allergies  Patient has no known allergies.  Family History  Problem Relation Age of Onset   Diabetes Mother    Hypertension Mother    Diabetes Father    Hypertension Father    Heart disease Maternal Grandmother    Heart disease Maternal Grandfather    Stroke Paternal Grandfather    Cancer Neg Hx    COPD Neg Hx     Social History Social History   Tobacco Use   Smoking status: Never Smoker   Smokeless tobacco: Never Used  Substance Use Topics   Alcohol use: No    Alcohol/week: 0.0 standard drinks    Comment: rare   Drug use: No    Review of  Systems Constitutional: No fever/chills denies exposure to COVID-19 Eyes: No visual changes. ENT: No sore throat. Cardiovascular: See HPI Respiratory: Denies shortness of breath. Gastrointestinal: No abdominal pain.  Had diarrhea about 2 weeks ago that resolved Genitourinary: Negative for dysuria. Musculoskeletal: Negative for back pain. Skin: Negative for rash. Neurological: Negative for headaches, areas of focal weakness or numbness.    ____________________________________________   PHYSICAL EXAM:  VITAL SIGNS: ED Triage Vitals  Enc Vitals Group     BP 03/24/19 1620 140/79     Pulse Rate 03/24/19 1620 70     Resp 03/24/19 1620 17     Temp 03/24/19 1620 98.5 F (36.9 C)     Temp Source 03/24/19 1620 Oral     SpO2 03/24/19 1620 100 %     Weight 03/24/19 1621 250 lb (113.4 kg)     Height 03/24/19 1621 5\' 11"  (1.803 m)     Head Circumference --      Peak Flow --      Pain Score 03/24/19 1620 7     Pain Loc --      Pain Edu? --      Excl. in [place]? --     Constitutional: Alert and oriented. Well appearing and in no acute distress. Eyes: Conjunctivae are normal. Head: Atraumatic. Nose: No congestion/rhinnorhea. Mouth/Throat: Mucous membranes are moist. Neck: No stridor.  Cardiovascular: Normal rate, regular rhythm. Grossly normal heart sounds.  Good peripheral circulation. Respiratory: Normal respiratory effort.  No retractions. Lungs CTAB. Gastrointestinal: Soft and nontender. No distention. Musculoskeletal: No lower extremity tenderness nor edema. Neurologic:  Normal speech and language. No gross focal neurologic deficits are appreciated.  Skin:  Skin is warm, dry and intact. No rash noted. Psychiatric: Mood and affect are normal. Speech and behavior are normal.  ____________________________________________   LABS (all labs ordered are listed, but only abnormal results are displayed)  Labs Reviewed  BASIC METABOLIC PANEL - Abnormal; Notable for the following  components:      Result Value   Glucose, Bld 277 (*)    All other components within normal limits  TROPONIN I (HIGH SENSITIVITY) - Abnormal; Notable for the following components:   Troponin I (High Sensitivity) 362 (*)    All other components within normal limits  SARS CORONAVIRUS 2 (TAT 6-24 HRS)  CBC  PROTIME-INR  APTT  TROPONIN I (HIGH SENSITIVITY)   ____________________________________________  EKG  Reviewed inter by me at 1629 heart rate 65 9 QRS 109 QTc 410 Normal sinus rhythm, no evidence of acute ischemia.  Incomplete right bundle branch block. Lead III has a notable Q wave, compared with previous EKG from February 21, 2018 this does not appear to be a new change  Second EKG reviewed at 2010 Heart rate 65 QRS 109 QTc 420 Normal  sinus rhythm, again seen Q-wave lead III.  No evidence of acute STEMI or ischemia denoted ____________________________________________  RADIOLOGY  Dg Chest 2 View  Result Date: 03/24/2019 CLINICAL DATA:  Chest pain EXAM: CHEST - 2 VIEW COMPARISON:  02/20/2018 FINDINGS: The heart size and mediastinal contours are within normal limits. Both lungs are clear. The visualized skeletal structures are unremarkable. IMPRESSION: No active cardiopulmonary disease. Electronically Signed   By: Donavan Foil M.D.   On: 03/24/2019 16:57    ____________________________________________   PROCEDURES  Procedure(s) performed: None  Procedures  Critical Care performed: Yes, see critical care note(s)  CRITICAL CARE Performed by: Delman Kitten   Total critical care time: 30 minutes  Critical care time was exclusive of separately billable procedures and treating other patients.  Critical care was necessary to treat or prevent imminent or life-threatening deterioration.  Critical care was time spent personally by me on the following activities: development of treatment plan with patient and/or surrogate as well as nursing, discussions with consultants,  evaluation of patient's response to treatment, examination of patient, obtaining history from patient or surrogate, ordering and performing treatments and interventions, ordering and review of laboratory studies, ordering and review of radiographic studies, pulse oximetry and re-evaluation of patient's condition.  ____________________________________________   INITIAL IMPRESSION / ASSESSMENT AND PLAN / ED COURSE  Pertinent labs & imaging results that were available during my care of the patient were reviewed by me and considered in my medical decision making (see chart for details).   Differential diagnosis includes, but is not limited to, ACS, aortic dissection, pulmonary embolism, cardiac tamponade, pneumothorax, pneumonia, pericarditis, myocarditis, GI-related causes including esophagitis/gastritis, and musculoskeletal chest wall pain.     Clinical Course as of Mar 23 2013  Fri Mar 24, 2019  2012 Case and care discussed with Dr Clayborn Bigness, he recommends and agrees with plan to admit patient for further work-up, aspirin and heparin infusion. Will have cardiology see in consult   [MQ]    Clinical Course User Index [MQ] Delman Kitten, MD   ----------------------------------------- 8:14 PM on 03/24/2019 -----------------------------------------   Discussed with patient, he remains pain-free, comfortable understanding of plan to start him on additional blood thinner including heparin.  Will be admitted for cardiology consultation to hospitalist service.  He does not have history of any known bleeding disorders  ____________________________________________   FINAL CLINICAL IMPRESSION(S) / ED DIAGNOSES  Final diagnoses:  NSTEMI (non-ST elevated myocardial infarction) [facility])        Note:  This document was prepared using Dragon voice recognition software and may include unintentional dictation errors       Delman Kitten, MD 03/24/19 2015    Delman Kitten, MD 03/24/19 2024

## 2019-03-25 DIAGNOSIS — E119 Type 2 diabetes mellitus without complications: Secondary | ICD-10-CM

## 2019-03-25 DIAGNOSIS — Z789 Other specified health status: Secondary | ICD-10-CM

## 2019-03-25 DIAGNOSIS — R079 Chest pain, unspecified: Secondary | ICD-10-CM

## 2019-03-25 LAB — CBC
HCT: 44.2 % (ref 39.0–52.0)
Hemoglobin: 14.7 g/dL (ref 13.0–17.0)
MCH: 29.2 pg (ref 26.0–34.0)
MCHC: 33.3 g/dL (ref 30.0–36.0)
MCV: 87.9 fL (ref 80.0–100.0)
Platelets: 263 10*3/uL (ref 150–400)
RBC: 5.03 MIL/uL (ref 4.22–5.81)
RDW: 12.9 % (ref 11.5–15.5)
WBC: 7.8 10*3/uL (ref 4.0–10.5)
nRBC: 0 % (ref 0.0–0.2)

## 2019-03-25 LAB — SARS CORONAVIRUS 2 (TAT 6-24 HRS): SARS Coronavirus 2: NEGATIVE

## 2019-03-25 LAB — TROPONIN I (HIGH SENSITIVITY)
Troponin I (High Sensitivity): 1234 ng/L (ref <18)
Troponin I (High Sensitivity): 1072 ng/L (ref <18)
Troponin I (High Sensitivity): 1145 ng/L (ref <18)

## 2019-03-25 LAB — GLUCOSE, CAPILLARY
Glucose-Capillary: 188 mg/dL — ABNORMAL HIGH (ref 70–99)
Glucose-Capillary: 239 mg/dL — ABNORMAL HIGH (ref 70–99)
Glucose-Capillary: 242 mg/dL — ABNORMAL HIGH (ref 70–99)
Glucose-Capillary: 251 mg/dL — ABNORMAL HIGH (ref 70–99)
Glucose-Capillary: 255 mg/dL — ABNORMAL HIGH (ref 70–99)

## 2019-03-25 LAB — HIV ANTIBODY (ROUTINE TESTING W REFLEX): HIV Screen 4th Generation wRfx: NONREACTIVE

## 2019-03-25 LAB — HEPARIN LEVEL (UNFRACTIONATED)
Heparin Unfractionated: 0.18 IU/mL — ABNORMAL LOW (ref 0.30–0.70)
Heparin Unfractionated: 0.19 IU/mL — ABNORMAL LOW (ref 0.30–0.70)

## 2019-03-25 LAB — HEMOGLOBIN A1C
Hgb A1c MFr Bld: 10.1 % — ABNORMAL HIGH (ref 4.8–5.6)
Mean Plasma Glucose: 243.17 mg/dL

## 2019-03-25 MED ORDER — HEPARIN BOLUS VIA INFUSION
2800.0000 [IU] | Freq: Once | INTRAVENOUS | Status: AC
  Administered 2019-03-25: 2800 [IU] via INTRAVENOUS
  Filled 2019-03-25: qty 2800

## 2019-03-25 MED ORDER — INSULIN GLARGINE 100 UNIT/ML ~~LOC~~ SOLN
5.0000 [IU] | Freq: Every day | SUBCUTANEOUS | Status: DC
  Administered 2019-03-25 – 2019-03-26 (×2): 5 [IU] via SUBCUTANEOUS
  Filled 2019-03-25 (×3): qty 0.05

## 2019-03-25 MED ORDER — INFLUENZA VAC SPLIT QUAD 0.5 ML IM SUSY: 0.5000 mL | PREFILLED_SYRINGE | INTRAMUSCULAR | Status: DC

## 2019-03-25 MED ORDER — HEPARIN BOLUS VIA INFUSION
3000.0000 [IU] | Freq: Once | INTRAVENOUS | Status: AC
  Administered 2019-03-25: 3000 [IU] via INTRAVENOUS
  Filled 2019-03-25: qty 3000

## 2019-03-25 MED ORDER — METOPROLOL TARTRATE 25 MG PO TABS
25.0000 mg | ORAL_TABLET | Freq: Two times a day (BID) | ORAL | Status: DC
  Administered 2019-03-25 – 2019-03-26 (×3): 25 mg via ORAL
  Filled 2019-03-25 (×3): qty 1

## 2019-03-25 NOTE — ED Notes (Signed)
AC reports director Evette relayed floor is overwhelmed and are not going to take patients at this time.  

## 2019-03-25 NOTE — ED Notes (Addendum)
Heparin change verified with devetta mcclain rn.

## 2019-03-25 NOTE — Progress Notes (Signed)
Report called to Enis Gash, RN receiving nurse for pt. being transferred to room 256.

## 2019-03-25 NOTE — ED Notes (Signed)
EKG shown to dr. Alfred Levins.

## 2019-03-25 NOTE — ED Notes (Signed)
Report to jennifer and ashley, rn.  

## 2019-03-25 NOTE — ED Notes (Signed)
Lab at bedside

## 2019-03-25 NOTE — ED Notes (Addendum)
Trop and hep level tubes drawn

## 2019-03-25 NOTE — ED Notes (Signed)
Lab contacted to send phlebotomist  

## 2019-03-25 NOTE — Progress Notes (Signed)
ANTICOAGULATION CONSULT NOTE   Pharmacy Consult for Heparin drip Indication: chest pain/ACS  No Known Allergies  Patient Measurements: Height: 5\' 11"  (180.3 cm) Weight: 250 lb (113.4 kg) IBW/kg (Calculated) : 75.3 Heparin Dosing Weight: 99.9 kg  Vital Signs: BP: 128/65 (12/05 0600) Pulse Rate: 59 (12/05 0600)  Labs: Recent Labs    03/24/19 1627 03/24/19 1924 03/24/19 2017 03/25/19 0409 03/25/19 0559  HGB 16.0  --   --  14.7  --   HCT 46.1  --   --  44.2  --   PLT 295  --   --  263  --   APTT  --   --  24  --   --   LABPROT  --   --  12.7  --   --   INR  --   --  1.0  --   --   HEPARINUNFRC  --   --   --   --  0.18*  CREATININE 0.80  --   --   --   --   TROPONINIHS 11 362*  --   --   --     Estimated Creatinine Clearance: 141.4 mL/min (by C-G formula based on SCr of 0.8 mg/dL).   Medical History: Past Medical History:  Diagnosis Date  . Agoraphobia   . Allergy   . Anxiety   . Depression   . Diabetes mellitus without complication ([place])   . Hyperlipidemia   . Migraines     Medications:  Scheduled:  . aspirin EC  81 mg Oral Daily  . atorvastatin  80 mg Oral q1800  . buPROPion  150 mg Oral Daily  . Ertugliflozin L-PyroglutamicAc  15 mg Oral QAC breakfast  . escitalopram  20 mg Oral QHS  . insulin aspart  0-15 Units Subcutaneous Q6H  . metoprolol tartrate  12.5 mg Oral BID  . multivitamin with minerals  1 tablet Oral Daily  . traZODone  100 mg Oral QHS   Infusions:  . sodium chloride 100 mL/hr at 03/24/19 2359  . heparin 1,400 Units/hr (03/24/19 2203)    Assessment: 48 yo M to start Heparin drip for ACS/STEMI. No anticoag PTA per Med Rec Hgb 16.0>> 14.7   Plt 295>> 263  12/4 Heparin infusion started @ 1400 units/hr   Goal of Therapy:  Heparin level 0.3-0.7 units/ml Monitor platelets by anticoagulation protocol: Yes   Plan:  12/5 @ 0559 HL: 0.18. Level is subtherapeutic.  Will order 2800 unit bolus and increase infusion rate to 1700  units/hr Recheck anti-Xa level in 6 hours and daily while on heparin Continue to monitor H&H and platelets  Pernell Dupre, PharmD, BCPS Clinical Pharmacist 03/25/2019 6:28 AM

## 2019-03-25 NOTE — ED Notes (Signed)
Report received from April, RN.  

## 2019-03-25 NOTE — Progress Notes (Signed)
ANTICOAGULATION CONSULT NOTE   Pharmacy Consult for Heparin drip Indication: chest pain/ACS  No Known Allergies  Patient Measurements: Height: 5\' 11"  (180.3 cm) Weight: 250 lb (113.4 kg) IBW/kg (Calculated) : 75.3 Heparin Dosing Weight: 99.9 kg  Vital Signs: BP: 129/91 (12/05 1603) Pulse Rate: 61 (12/05 1603)  Labs: Recent Labs    03/24/19 1627 03/24/19 1924 03/24/19 2017 03/25/19 0409 03/25/19 0559 03/25/19 1302 03/25/19 1608  HGB 16.0  --   --  14.7  --   --   --   HCT 46.1  --   --  44.2  --   --   --   PLT 295  --   --  263  --   --   --   APTT  --   --  24  --   --   --   --   LABPROT  --   --  12.7  --   --   --   --   INR  --   --  1.0  --   --   --   --   HEPARINUNFRC  --   --   --   --  0.18*  --  0.19*  CREATININE 0.80  --   --   --   --   --   --   TROPONINIHS 11 362*  --   --   --  1,234*  --     Estimated Creatinine Clearance: 141.4 mL/min (by C-G formula based on SCr of 0.8 mg/dL).   Medical History: Past Medical History:  Diagnosis Date  . Agoraphobia   . Allergy   . Anxiety   . Depression   . Diabetes mellitus without complication ([place])   . Hyperlipidemia   . Migraines     Medications:  Scheduled:  . aspirin EC  81 mg Oral Daily  . atorvastatin  80 mg Oral q1800  . buPROPion  150 mg Oral Daily  . escitalopram  20 mg Oral QHS  . heparin  3,000 Units Intravenous Once  . insulin aspart  0-15 Units Subcutaneous Q6H  . insulin glargine  5 Units Subcutaneous Daily  . metoprolol tartrate  25 mg Oral BID  . multivitamin with minerals  1 tablet Oral Daily  . traZODone  100 mg Oral QHS   Infusions:  . heparin 1,700 Units/hr (03/25/19 0930)    Assessment: 48 yo M to start Heparin drip for ACS/STEMI. No anticoag PTA per Med Rec Hgb 16.0>> 14.7   Plt 295>> 263  12/4 Heparin infusion started @ 1400 units/hr   Goal of Therapy:  Heparin level 0.3-0.7 units/ml Monitor platelets by anticoagulation protocol: Yes   Plan:  12/5 @ 0559 HL:  0.18. Level is subtherapeutic.  Will order 2800 unit bolus and increase infusion rate to 1700 units/hr Recheck anti-Xa level in 6 hours and daily while on heparin Continue to monitor H&H and platelets  12/5: HL @ 1608 = 0.19 Will order Heparin 3000 units IV X 1 bolus and increase drip rate to 2100 units/hr.  Will recheck HL 6 hrs after rate change.   Orene Desanctis, PharmD Clinical Pharmacist 03/25/2019 5:15 PM

## 2019-03-25 NOTE — Progress Notes (Signed)
PROGRESS NOTE    Glenn Flynn  [id number] DOB: April 25, 1970 DOA: 03/24/2019 PCP: Valerie Roys, DO    Brief Narrative:  Per HPI: Glenn Flynn  is a 48 y.o. Caucasian male with a known history of diabetes mellitus, dyslipidemia, depression and anxiety, who presented to the emergency room with the onset of midsternal chest pain felt as pressure and graded 7-8/10 in severity with radiation to his right side of the neck.  He denied any associated nausea vomiting or diaphoresis.  No palpitations.  He stated he may have felt mild dyspnea from anxiety.  He denied any recent leg pain or edema recent travels or surgeries.  No fever or chills.  No cough or hemoptysis.  No recent sick exposure to COVID-19.  Upon presentation to the emergency room, vital signs were within normal.  Labs revealed a blood glucose of 277 and initial high-sensitivity troponin I of 11 and within 3 hours went up to 362.  Initial EKG showed normal sinus rhythm with a rate of 65 with Q waves and inverted T waves inferiorly and incomplete right bundle branch block.  Repeat EKG showed normal sinus rhythm rate of 63 without significant changes.  COVID-19 test is currently pending.  Dr. Clayborn Bigness was notified about the patient.  The patient was given 4 baby aspirin and started on IV heparin.  He is currently pain-free.  He will be admitted to an observation telemetry bed for further evaluation and management.  12/5: Patient seen and examined in the emergency department.  Essentially chest pain-free at this point.  Recent high-sensitivity troponin 362, subsequently down trended to 11  Assessment & Plan:   Active Problems:   NSTEMI (non-ST elevated myocardial infarction) (HCC)  Chest pain elevated troponin I, concerning for ACS/non-STEMI.   Patient's description of chest pain is dull substernal in location with radiation up to the right side of the neck.  Initial EKG was concerning for possible ischemic changes however  follow-up EKG did not demonstrate ischemic changes.  High-sensitivity troponin peaked at 362 then subsequently down trended to 11.  Other possibilities include musculoskeletal/costochondritis as well as possible reflux disease.  Echocardiogram is ordered and is pending at time of this evaluation  Follow troponins to peak Continue IV heparin for now Continue aspirin Continue metoprolol tartrate 12.5 mg twice daily, uptitrate as tolerated Continue as needed sublingual nitroglycerin and morphine for pain Continue high-dose statin therapy Follow-up echocardiogram Cardiology consulted, follow recommendations   Type II diabetes mellitus, currently uncontrolled.   Hold metformin Hold home Steglatro, not on hospital formulary Follow-up hemoglobin A1c Start Lantus 5 units daily, uptitrate as needed Sliding scale insulin, moderate intensity Target blood sugar while hospitalized 140-180  Dyslipidemia.   Continue high-dose statin Follow-up fasting lipids  Depression and anxiety.   continue Lexapro, Wellbutrin XL and as needed Xanax.   DVT prophylaxis: Heparin GTT Code Status: Full Family Communication: None  disposition Plan: Home, anticipate 24 hours   Consultants:   Cardiology  Procedures:   None  Antimicrobials:   None   Subjective: Seen and examined No acute events over interval Chest pain resolved Vital signs stable No new complaints  Objective: Vitals:   03/25/19 0830 03/25/19 0900 03/25/19 1230 03/25/19 1300  BP: (!) 133/93 (!) 142/89 (!) 135/93 (!) 137/96  Pulse: 87 63    Resp: 20 (!) 21 14 (!) 21  Temp:      TempSrc:      SpO2: 96% 96%    Weight:  Height:        Intake/Output Summary (Last 24 hours) at 03/25/2019 1350 Last data filed at 03/25/2019 0918 Gross per 24 hour  Intake 1000 ml  Output 300 ml  Net 700 ml   Filed Weights   03/24/19 1621  Weight: 113.4 kg    Examination:  General exam: Appears calm and comfortable  Respiratory  system: Clear to auscultation. Respiratory effort normal. Cardiovascular system: S1 & S2 heard, RRR. No JVD, murmurs, rubs, gallops or clicks. No pedal edema. Gastrointestinal system: Abdomen is nondistended, soft and nontender. No organomegaly or masses felt. Normal bowel sounds heard. Central nervous system: Alert and oriented. No focal neurological deficits. Extremities: Symmetric 5 x 5 power. Skin: No rashes, lesions or ulcers Psychiatry: Judgement and insight appear normal. Mood & affect appropriate.     Data Reviewed: I have personally reviewed following labs and imaging studies  CBC: Recent Labs  Lab 03/24/19 1627 03/25/19 0409  WBC 7.7 7.8  HGB 16.0 14.7  HCT 46.1 44.2  MCV 85.5 87.9  PLT 295 [id number]   Basic Metabolic Panel: Recent Labs  Lab 03/24/19 1627  NA 136  K 4.5  CL 101  CO2 24  GLUCOSE 277*  BUN 16  CREATININE 0.80  CALCIUM 9.5   GFR: Estimated Creatinine Clearance: 141.4 mL/min (by C-G formula based on SCr of 0.8 mg/dL). Liver Function Tests: No results for input(s): AST, ALT, ALKPHOS, BILITOT, PROT, ALBUMIN in the last 168 hours. No results for input(s): LIPASE, AMYLASE in the last 168 hours. No results for input(s): AMMONIA in the last 168 hours. Coagulation Profile: Recent Labs  Lab 03/24/19 2017  INR 1.0   Cardiac Enzymes: No results for input(s): CKTOTAL, CKMB, CKMBINDEX, TROPONINI in the last 168 hours. BNP (last 3 results) No results for input(s): PROBNP in the last 8760 hours. HbA1C: No results for input(s): HGBA1C in the last 72 hours. CBG: Recent Labs  Lab 03/25/19 0003 03/25/19 0612 03/25/19 1214  GLUCAP 239* 188* 251*   Lipid Profile: No results for input(s): CHOL, HDL, LDLCALC, TRIG, CHOLHDL, LDLDIRECT in the last 72 hours. Thyroid Function Tests: No results for input(s): TSH, T4TOTAL, FREET4, T3FREE, THYROIDAB in the last 72 hours. Anemia Panel: No results for input(s): VITAMINB12, FOLATE, FERRITIN, TIBC, IRON, RETICCTPCT  in the last 72 hours. Sepsis Labs: No results for input(s): PROCALCITON, LATICACIDVEN in the last 168 hours.  Recent Results (from the past 240 hour(s))  SARS CORONAVIRUS 2 (TAT 6-24 HRS) Nasopharyngeal Nasopharyngeal Swab     Status: None   Collection Time: 03/24/19  8:17 PM   Specimen: Nasopharyngeal Swab  Result Value Ref Range Status   SARS Coronavirus 2 NEGATIVE NEGATIVE Final    Comment: (NOTE) SARS-CoV-2 target nucleic acids are NOT DETECTED. The SARS-CoV-2 RNA is generally detectable in upper and lower respiratory specimens during the acute phase of infection. Negative results do not preclude SARS-CoV-2 infection, do not rule out co-infections with other pathogens, and should not be used as the sole basis for treatment or other patient management decisions. Negative results must be combined with clinical observations, patient history, and epidemiological information. The expected result is Negative. Fact Sheet for Patients: SugarRoll.be Fact Sheet for Healthcare Providers: https://www.woods-mathews.com/ This test is not yet approved or cleared by the [place] FDA and  has been authorized for detection and/or diagnosis of SARS-CoV-2 by FDA under an Emergency Use Authorization (EUA). This EUA will remain  in effect (meaning this test can be used) for the duration of the  COVID-19 declaration under Section 56 4(b)(1) of the Act, 21 U.S.C. section 360bbb-3(b)(1), unless the authorization is terminated or revoked sooner. Performed at [facility], [place] [address], [place], [place] [address]          Radiology Studies: Dg Chest 2 View  Result Date: 03/24/2019 CLINICAL DATA:  Chest pain EXAM: CHEST - 2 VIEW COMPARISON:  02/20/2018 FINDINGS: The heart size and mediastinal contours are within normal limits. Both lungs are clear. The visualized skeletal structures are unremarkable. IMPRESSION: No active cardiopulmonary  disease. Electronically Signed   By: Donavan Foil M.D.   On: 03/24/2019 16:57        Scheduled Meds: . aspirin EC  81 mg Oral Daily  . atorvastatin  80 mg Oral q1800  . buPROPion  150 mg Oral Daily  . escitalopram  20 mg Oral QHS  . insulin aspart  0-15 Units Subcutaneous Q6H  . insulin glargine  5 Units Subcutaneous Daily  . metoprolol tartrate  12.5 mg Oral BID  . multivitamin with minerals  1 tablet Oral Daily  . traZODone  100 mg Oral QHS   Continuous Infusions: . heparin 1,700 Units/hr (03/25/19 0930)     LOS: 1 day    Time spent: 35 minutes    Sidney Ace, MD [facility] Pager [phone]  If 7PM-7AM, please contact night-coverage [email or web address] Password [email or web address] 03/25/2019, 1:50 PM

## 2019-03-25 NOTE — ED Notes (Signed)
Pt moved to room 34 - repeat blue top needs drawn and info passed on to new nurse

## 2019-03-26 ENCOUNTER — Inpatient Hospital Stay
Admit: 2019-03-26 | Discharge: 2019-03-26 | Disposition: A | Discharge status: Home / Self Care | Payer: BC Managed Care – PPO | Attending: Family Medicine | Admitting: Family Medicine

## 2019-03-26 DIAGNOSIS — I209 Angina pectoris, unspecified: Secondary | ICD-10-CM

## 2019-03-26 LAB — GLUCOSE, CAPILLARY
Glucose-Capillary: 161 mg/dL — ABNORMAL HIGH (ref 70–99)
Glucose-Capillary: 195 mg/dL — ABNORMAL HIGH (ref 70–99)
Glucose-Capillary: 192 mg/dL — ABNORMAL HIGH (ref 70–99)
Glucose-Capillary: 175 mg/dL — ABNORMAL HIGH (ref 70–99)
Glucose-Capillary: 271 mg/dL — ABNORMAL HIGH (ref 70–99)

## 2019-03-26 LAB — HEPARIN LEVEL (UNFRACTIONATED)
Heparin Unfractionated: 0.62 IU/mL (ref 0.30–0.70)
Heparin Unfractionated: 0.64 IU/mL (ref 0.30–0.70)
Heparin Unfractionated: 0.71 IU/mL — ABNORMAL HIGH (ref 0.30–0.70)
Heparin Unfractionated: 0.87 IU/mL — ABNORMAL HIGH (ref 0.30–0.70)

## 2019-03-26 LAB — CBC WITH DIFFERENTIAL/PLATELET
Abs Immature Granulocytes: 0.02 10*3/uL (ref 0.00–0.07)
Basophils Absolute: 0.1 10*3/uL (ref 0.0–0.1)
Basophils Relative: 1 %
Eosinophils Absolute: 0.9 10*3/uL — ABNORMAL HIGH (ref 0.0–0.5)
Eosinophils Relative: 12 %
HCT: 44.6 % (ref 39.0–52.0)
Hemoglobin: 14.5 g/dL (ref 13.0–17.0)
Immature Granulocytes: 0 %
Lymphocytes Relative: 38 %
Lymphs Abs: 3 10*3/uL (ref 0.7–4.0)
MCH: 29.1 pg (ref 26.0–34.0)
MCHC: 32.5 g/dL (ref 30.0–36.0)
MCV: 89.6 fL (ref 80.0–100.0)
Monocytes Absolute: 0.7 10*3/uL (ref 0.1–1.0)
Monocytes Relative: 8 %
Neutro Abs: 3.2 10*3/uL (ref 1.7–7.7)
Neutrophils Relative %: 41 %
Platelets: 288 10*3/uL (ref 150–400)
RBC: 4.98 MIL/uL (ref 4.22–5.81)
RDW: 12.9 % (ref 11.5–15.5)
WBC: 7.8 10*3/uL (ref 4.0–10.5)
nRBC: 0 % (ref 0.0–0.2)

## 2019-03-26 LAB — BASIC METABOLIC PANEL
Anion gap: 10 (ref 5–15)
BUN: 17 mg/dL (ref 6–20)
CO2: 24 mmol/L (ref 22–32)
Calcium: 9 mg/dL (ref 8.9–10.3)
Chloride: 104 mmol/L (ref 98–111)
Creatinine, Ser: 0.73 mg/dL (ref 0.61–1.24)
GFR calc Af Amer: >60 mL/min (ref >60)
GFR calc non Af Amer: >60 mL/min (ref >60)
Glucose, Bld: 214 mg/dL — ABNORMAL HIGH (ref 70–99)
Potassium: 4.1 mmol/L (ref 3.5–5.1)
Sodium: 138 mmol/L (ref 135–145)

## 2019-03-26 LAB — TROPONIN I (HIGH SENSITIVITY)
Troponin I (High Sensitivity): 865 ng/L (ref <18)
Troponin I (High Sensitivity): 629 ng/L (ref <18)

## 2019-03-26 LAB — ECHOCARDIOGRAM COMPLETE
Height: 71 in
Weight: 3961.6 oz

## 2019-03-26 MED ORDER — SODIUM CHLORIDE 0.9 % IV SOLN: 250.0000 mL | INTRAVENOUS | Status: DC | PRN

## 2019-03-26 MED ORDER — SODIUM CHLORIDE 0.9% FLUSH
3.0000 mL | Freq: Two times a day (BID) | INTRAVENOUS | Status: DC
  Administered 2019-03-26 (×2): 3 mL via INTRAVENOUS

## 2019-03-26 MED ORDER — INFLUENZA VAC SPLIT QUAD 0.5 ML IM SUSY
0.5000 mL | PREFILLED_SYRINGE | INTRAMUSCULAR | Status: AC
  Administered 2019-03-27: 0.5 mL via INTRAMUSCULAR
  Filled 2019-03-26: qty 0.5

## 2019-03-26 MED ORDER — SODIUM CHLORIDE 0.9 % WEIGHT BASED INFUSION
3.0000 mL/kg/h | INTRAVENOUS | Status: AC
  Administered 2019-03-27: 3 mL/kg/h via INTRAVENOUS

## 2019-03-26 MED ORDER — SODIUM CHLORIDE 0.9% FLUSH: 3.0000 mL | INTRAVENOUS | Status: DC | PRN

## 2019-03-26 MED ORDER — ASPIRIN 81 MG PO CHEW
81.0000 mg | CHEWABLE_TABLET | ORAL | Status: AC
  Administered 2019-03-27: 81 mg via ORAL
  Filled 2019-03-26: qty 1

## 2019-03-26 MED ORDER — SODIUM CHLORIDE 0.9 % WEIGHT BASED INFUSION
1.0000 mL/kg/h | INTRAVENOUS | Status: DC
  Administered 2019-03-27: 1 mL/kg/h via INTRAVENOUS

## 2019-03-26 NOTE — Progress Notes (Signed)
PROGRESS NOTE    Glenn Flynn  [id number] DOB: [birth year] DOA: 03/24/2019 PCP: Valerie Roys, DO    Brief Narrative:  Per HPI: Glenn Flynn  is a 48 y.o. Caucasian male with a known history of diabetes mellitus, dyslipidemia, depression and anxiety, who presented to the emergency room with the onset of midsternal chest pain felt as pressure and graded 7-8/10 in severity with radiation to his right side of the neck.  He denied any associated nausea vomiting or diaphoresis.  No palpitations.  He stated he may have felt mild dyspnea from anxiety.  He denied any recent leg pain or edema recent travels or surgeries.  No fever or chills.  No cough or hemoptysis.  No recent sick exposure to COVID-19.  Upon presentation to the emergency room, vital signs were within normal.  Labs revealed a blood glucose of 277 and initial high-sensitivity troponin I of 11 and within 3 hours went up to 362.  Initial EKG showed normal sinus rhythm with a rate of 65 with Q waves and inverted T waves inferiorly and incomplete right bundle branch block.  Repeat EKG showed normal sinus rhythm rate of 63 without significant changes.  COVID-19 test is currently pending.  Dr. Clayborn Bigness was notified about the patient.  The patient was given 4 baby aspirin and started on IV heparin.  He is currently pain-free.  He will be admitted to an observation telemetry bed for further evaluation and management.  12/5: Patient seen and examined in the emergency department.  Essentially chest pain-free at this point.  Recent high-sensitivity troponin 362, subsequently down trended to 11  12/6: Subsequent troponin yesterday up trended to 1234.  Patient was maintained on heparin infusion overnight.  Troponins hovering in the low 1000 though slightly downtrending.  Patient remains chest pain-free.  All vital signs are stable.  Seen by cardiology.  Plan for possible intervention during this admission.  Assessment & Plan:   Active  Problems:   NSTEMI (non-ST elevated myocardial infarction) ([place])   Full code status  Chest pain elevated troponin I, concerning for ACS/non-STEMI.   Patient's description of chest pain is dull substernal in location with radiation up to the right side of the neck.  Initial EKG was concerning for possible ischemic changes however follow-up EKG did not demonstrate ischemic changes.  High-sensitivity troponin peaked at 362 then subsequently down trended to 11.  Other possibilities include musculoskeletal/costochondritis as well as possible reflux disease.  Echocardiogram is ordered and is pending at time of this evaluation  Follow troponins to peak, now slight downtrend Continue IV heparin for now, infusion started 12/4 at 2045 Continue aspirin Continue metoprolol tartrate 25 mg twice daily Continue as needed sublingual nitroglycerin and morphine for pain Continue high-dose statin therapy Follow-up echocardiogram, pending Cardiology consulted, follow recommendations Possible ischemic work-up tomorrow while admitted   Type II diabetes mellitus, currently uncontrolled.   Hold metformin Hold home Steglatro, not on hospital formulary Follow-up hemoglobin A1c Start Lantus 5 units daily, uptitrate as needed Sliding scale insulin, moderate intensity Target blood sugar while hospitalized 140-180  Dyslipidemia.   Continue high-dose statin Follow-up fasting lipids  Depression and anxiety.   continue Lexapro, Wellbutrin XL and as needed Xanax.   DVT prophylaxis: Heparin GTT Code Status: Full Family Communication: None  disposition Plan: Home, pending clinical improvement and ischemic work-up   Consultants:   Cardiology  Procedures:   None  Antimicrobials:   None   Subjective: Seen and examined No acute events over interval  Chest pain-free Vital signs stable No new complaints  Objective: Vitals:   03/25/19 2125 03/26/19 0348 03/26/19 0817 03/26/19 0946  BP: 121/84  107/62 106/86   Pulse: 64 (!) 57 (!) 57 67  Resp: 18 20 19    Temp: 98.1 F (36.7 C) 98.6 F (37 C) 98.5 F (36.9 C)   TempSrc: Oral Oral Oral   SpO2: 97% 97% 96%   Weight:  112.3 kg    Height:        Intake/Output Summary (Last 24 hours) at 03/26/2019 1235 Last data filed at 03/26/2019 0900 Gross per 24 hour  Intake 674.62 ml  Output 1000 ml  Net -325.38 ml   Filed Weights   03/24/19 1621 03/26/19 0348  Weight: 113.4 kg 112.3 kg    Examination:  General exam: Appears calm and comfortable  Respiratory system: Clear to auscultation. Respiratory effort normal. Cardiovascular system: S1 & S2 heard, RRR. No JVD, murmurs, rubs, gallops or clicks. No pedal edema. Gastrointestinal system: Abdomen is nondistended, soft and nontender. No organomegaly or masses felt. Normal bowel sounds heard. Central nervous system: Alert and oriented. No focal neurological deficits. Extremities: Symmetric 5 x 5 power. Skin: No rashes, lesions or ulcers Psychiatry: Judgement and insight appear normal. Mood & affect appropriate.     Data Reviewed: I have personally reviewed following labs and imaging studies  CBC: Recent Labs  Lab 03/24/19 1627 03/25/19 0409 03/26/19 0559  WBC 7.7 7.8 7.8  NEUTROABS  --   --  3.2  HGB 16.0 14.7 14.5  HCT 46.1 44.2 44.6  MCV 85.5 87.9 89.6  PLT 295 263 [id number]   Basic Metabolic Panel: Recent Labs  Lab 03/24/19 1627 03/26/19 0559  NA 136 138  K 4.5 4.1  CL 101 104  CO2 24 24  GLUCOSE 277* 214*  BUN 16 17  CREATININE 0.80 0.73  CALCIUM 9.5 9.0   GFR: Estimated Creatinine Clearance: 140.8 mL/min (by C-G formula based on SCr of 0.73 mg/dL). Liver Function Tests: No results for input(s): AST, ALT, ALKPHOS, BILITOT, PROT, ALBUMIN in the last 168 hours. No results for input(s): LIPASE, AMYLASE in the last 168 hours. No results for input(s): AMMONIA in the last 168 hours. Coagulation Profile: Recent Labs  Lab 03/24/19 2017  INR 1.0   Cardiac  Enzymes: No results for input(s): CKTOTAL, CKMB, CKMBINDEX, TROPONINI in the last 168 hours. BNP (last 3 results) No results for input(s): PROBNP in the last 8760 hours. HbA1C: Recent Labs    03/24/19 2351  HGBA1C 10.1*   CBG: Recent Labs  Lab 03/25/19 1834 03/25/19 2138 03/26/19 0513 03/26/19 0818 03/26/19 1136  GLUCAP 255* 242* 161* 195* 192*   Lipid Profile: No results for input(s): CHOL, HDL, LDLCALC, TRIG, CHOLHDL, LDLDIRECT in the last 72 hours. Thyroid Function Tests: No results for input(s): TSH, T4TOTAL, FREET4, T3FREE, THYROIDAB in the last 72 hours. Anemia Panel: No results for input(s): VITAMINB12, FOLATE, FERRITIN, TIBC, IRON, RETICCTPCT in the last 72 hours. Sepsis Labs: No results for input(s): PROCALCITON, LATICACIDVEN in the last 168 hours.  Recent Results (from the past 240 hour(s))  SARS CORONAVIRUS 2 (TAT 6-24 HRS) Nasopharyngeal Nasopharyngeal Swab     Status: None   Collection Time: 03/24/19  8:17 PM   Specimen: Nasopharyngeal Swab  Result Value Ref Range Status   SARS Coronavirus 2 NEGATIVE NEGATIVE Final    Comment: (NOTE) SARS-CoV-2 target nucleic acids are NOT DETECTED. The SARS-CoV-2 RNA is generally detectable in upper and lower respiratory specimens during the  acute phase of infection. Negative results do not preclude SARS-CoV-2 infection, do not rule out co-infections with other pathogens, and should not be used as the sole basis for treatment or other patient management decisions. Negative results must be combined with clinical observations, patient history, and epidemiological information. The expected result is Negative. Fact Sheet for Patients: SugarRoll.be Fact Sheet for Healthcare Providers: https://www.woods-mathews.com/ This test is not yet approved or cleared by the [place] FDA and  has been authorized for detection and/or diagnosis of SARS-CoV-2 by FDA under an Emergency Use  Authorization (EUA). This EUA will remain  in effect (meaning this test can be used) for the duration of the COVID-19 declaration under Section 56 4(b)(1) of the Act, 21 U.S.C. section 360bbb-3(b)(1), unless the authorization is terminated or revoked sooner. Performed at [facility], [place] [address] [place], [place] [address]          Radiology Studies: Dg Chest 2 View  Result Date: 03/24/2019 CLINICAL DATA:  Chest pain EXAM: CHEST - 2 VIEW COMPARISON:  02/20/2018 FINDINGS: The heart size and mediastinal contours are within normal limits. Both lungs are clear. The visualized skeletal structures are unremarkable. IMPRESSION: No active cardiopulmonary disease. Electronically Signed   By: Donavan Foil M.D.   On: 03/24/2019 16:57        Scheduled Meds: . aspirin EC  81 mg Oral Daily  . atorvastatin  80 mg Oral q1800  . buPROPion  150 mg Oral Daily  . escitalopram  20 mg Oral QHS  . influenza vac split quadrivalent PF  0.5 mL Intramuscular Tomorrow-1000  . insulin aspart  0-15 Units Subcutaneous Q6H  . insulin glargine  5 Units Subcutaneous Daily  . metoprolol tartrate  25 mg Oral BID  . multivitamin with minerals  1 tablet Oral Daily  . sodium chloride flush  3 mL Intravenous Q12H  . traZODone  100 mg Oral QHS   Continuous Infusions: . heparin 1,900 Units/hr (03/26/19 1050)     LOS: 2 days    Time spent: 35 minutes    Sidney Ace, MD [facility] Pager [phone]  If 7PM-7AM, please contact night-coverage [email or web address] Password [email or web address] 03/26/2019, 12:35 PM

## 2019-03-26 NOTE — Progress Notes (Signed)
[facility] Cardiology  SUBJECTIVE: Denies significant chest pain angina or shortness of breath feeling reasonably well now no fever chills or sweats no nausea vomiting   Vitals:   03/26/19 0348 03/26/19 0817 03/26/19 0946 03/26/19 1527  BP: 107/62 106/86  122/76  Pulse: (!) 57 (!) 57 67 (!) 57  Resp: 20 19  20   Temp: 98.6 F (37 C) 98.5 F (36.9 C)  98.4 F (36.9 C)  TempSrc: Oral Oral    SpO2: 97% 96%  96%  Weight: 112.3 kg     Height:         Intake/Output Summary (Last 24 hours) at 03/26/2019 1921 Last data filed at 03/26/2019 1807 Gross per 24 hour  Intake 1154.62 ml  Output 1850 ml  Net -695.38 ml      PHYSICAL EXAM  General: Well developed, well nourished, in no acute distress HEENT:  Normocephalic and atramatic Neck:  No JVD.  Lungs: Clear bilaterally to auscultation and percussion. Heart: HRRR . Normal S1 and S2 without gallops or murmurs.  Abdomen: Bowel sounds are positive, abdomen soft and non-tender  Msk:  Back normal, normal gait. Normal strength and tone for age. Extremities: No clubbing, cyanosis or edema.   Neuro: Alert and oriented X 3. Psych:  Good affect, responds appropriately   LABS: Basic Metabolic Panel: Recent Labs    03/24/19 1627 03/26/19 0559  NA 136 138  K 4.5 4.1  CL 101 104  CO2 24 24  GLUCOSE 277* 214*  BUN 16 17  CREATININE 0.80 0.73  CALCIUM 9.5 9.0   Liver Function Tests: No results for input(s): AST, ALT, ALKPHOS, BILITOT, PROT, ALBUMIN in the last 72 hours. No results for input(s): LIPASE, AMYLASE in the last 72 hours. CBC: Recent Labs    03/25/19 0409 03/26/19 0559  WBC 7.8 7.8  NEUTROABS  --  3.2  HGB 14.7 14.5  HCT 44.2 44.6  MCV 87.9 89.6  PLT 263 288   Cardiac Enzymes: No results for input(s): CKTOTAL, CKMB, CKMBINDEX, TROPONINI in the last 72 hours. BNP: Invalid input(s): POCBNP D-Dimer: No results for input(s): DDIMER in the last 72 hours. Hemoglobin A1C: Recent Labs    03/24/19 2351  HGBA1C 10.1*    Fasting Lipid Panel: No results for input(s): CHOL, HDL, LDLCALC, TRIG, CHOLHDL, LDLDIRECT in the last 72 hours. Thyroid Function Tests: No results for input(s): TSH, T4TOTAL, T3FREE, THYROIDAB in the last 72 hours.  Invalid input(s): FREET3 Anemia Panel: No results for input(s): VITAMINB12, FOLATE, FERRITIN, TIBC, IRON, RETICCTPCT in the last 72 hours.  No results found.   Echo pending  TELEMETRY: Normal sinus rhythm on telemetry nonspecific T2 change:  ASSESSMENT AND PLAN:  Active Problems:   Ischemic chest pain (HCC)   NSTEMI (non-ST elevated myocardial infarction) ([place])   Full code status    1.  Elevated troponins Possible non-STEMI  possible myocarditis Diabetes type 2 Hypertension Hyperlipidemia Obesity . Plan Recommend cardiac cath in the morning Rule out significant coronary disease Continue anticoagulation therapy for now Agree with echocardiogram for assessment of ventricular function Continue diabetes management and control Maintain treatment for hypertension Continue hyperlipidemia therapy  Yolonda Kida, MD 03/26/2019 7:21 PM

## 2019-03-26 NOTE — Consult Note (Signed)
[facility] Cardiology  CARDIOLOGY CONSULT NOTE  Patient ID: Glenn Flynn MRN: [id number] DOB/AGE: 1971-04-02 48 y.o.  Admit date: 03/24/2019 Referring Physician Dr Sidney Ace hospitalist Primary Physician [place] Liter primary Primary Cardiologist none Reason for Consultation chest pain elevated troponin  HPI: 48 year old obese white male history of diabetes hyperlipidemia depression anxiety presented to emergency room with midsternal chest pressure 8 out of 10 rating to the right side of his neck.  No nausea vomiting diarrhea no palpitations.  Felt mild dyspnea has a long history of anxiety he had no recent episodes of angina no recent travel no significant known Covid exposure.  Patient had a previous episode very similarly few years ago with negative cath and thought to be myocarditis treated conservatively.  Has been compliant with diabetes medications has had improved symptoms but has had elevated troponins concerning for acute coronary syndrome versus non-STEMI.  The patient states compliance with diabetes medications.  Review of systems complete and found to be negative unless listed above     Past Medical History:  Diagnosis Date  . Agoraphobia   . Allergy   . Anxiety   . Depression   . Diabetes mellitus without complication ([place])   . Hyperlipidemia   . Migraines     Past Surgical History:  Procedure Laterality Date  . LEFT HEART CATH AND CORONARY ANGIOGRAPHY N/A 02/21/2018   Procedure: LEFT HEART CATH AND CORONARY ANGIOGRAPHY;  Surgeon: Corey Skains, MD;  Location: [facility];  Service: Cardiovascular;  Laterality: N/A;  . TONSILLECTOMY  Age 30    Medications Prior to Admission  Medication Sig Dispense Refill Last Dose  . ALPRAZolam (XANAX) 0.5 MG tablet Take 1 tablet (0.5 mg total) by mouth 2 (two) times daily as needed for anxiety. 30 tablet 1 prn at prn  . buPROPion (WELLBUTRIN XL) 150 MG 24 hr tablet Take 1 tablet (150 mg total) by mouth daily. 90 tablet 1  unknonw at unknown  . Ertugliflozin L-PyroglutamicAc (STEGLATRO) 15 MG TABS Take 15 mg by mouth daily before breakfast. 30 tablet 6 unknown at unknown  . escitalopram (LEXAPRO) 20 MG tablet Take 1 tablet (20 mg total) by mouth at bedtime. 90 tablet 1 unknown at unknown  . metFORMIN (GLUCOPHAGE-XR) 500 MG 24 hr tablet Take 2 tablets (1,000 mg total) by mouth 2 (two) times daily. 360 tablet 1 unknown at unknown  . Multiple Vitamins-Minerals (MULTIVITAMIN WITH MINERALS) tablet Take 1 tablet by mouth daily.   unknown at unknown  . traZODone (DESYREL) 100 MG tablet TAKE 1 TABLET BY MOUTH EVERYDAY AT BEDTIME 90 tablet 1 unknown at unknown  . triamcinolone ointment (KENALOG) 0.5 % Apply 1 application topically 2 (two) times daily. 30 g 0 unknown at unknown  . empagliflozin (JARDIANCE) 10 MG TABS tablet Take 10 mg by mouth daily. (Patient not taking: Reported on 02/21/2019) 30 tablet 3 Not Taking at Unknown time   Social History   Socioeconomic History  . Marital status: Single    Spouse name: Not on file  . Number of children: Not on file  . Years of education: Not on file  . Highest education level: Not on file  Occupational History  . Not on file  Social Needs  . Financial resource strain: Not on file  . Food insecurity    Worry: Not on file    Inability: Not on file  . Transportation needs    Medical: Not on file    Non-medical: Not on file  Tobacco Use  .  Smoking status: Never Smoker  . Smokeless tobacco: Never Used  Substance and Sexual Activity  . Alcohol use: No    Alcohol/week: 0.0 standard drinks    Comment: rare  . Drug use: No  . Sexual activity: Yes  Lifestyle  . Physical activity    Days per week: Not on file    Minutes per session: Not on file  . Stress: Not on file  Relationships  . Social Herbalist on phone: Not on file    Gets together: Not on file    Attends religious service: Not on file    Active member of club or organization: Not on file     Attends meetings of clubs or organizations: Not on file    Relationship status: Not on file  . Intimate partner violence    Fear of current or ex partner: Not on file    Emotionally abused: Not on file    Physically abused: Not on file    Forced sexual activity: Not on file  Other Topics Concern  . Not on file  Social History Narrative  . Not on file    Family History  Problem Relation Age of Onset  . Diabetes Mother   . Hypertension Mother   . Diabetes Father   . Hypertension Father   . Heart disease Maternal Grandmother   . Heart disease Maternal Grandfather   . Stroke Paternal Grandfather   . Cancer Neg Hx   . COPD Neg Hx       Review of systems complete and found to be negative unless listed above      PHYSICAL EXAM  General: Well developed, well nourished, in no acute distress HEENT:  Normocephalic and atramatic Neck:  No JVD.  Lungs: Clear bilaterally to auscultation and percussion. Heart: HRRR . Normal S1 and S2 without gallops or murmurs.  Abdomen: Bowel sounds are positive, abdomen soft and non-tender  Msk:  Back normal, normal gait. Normal strength and tone for age. Extremities: No clubbing, cyanosis or edema.   Neuro: Alert and oriented X 3. Psych:  Good affect, responds appropriately  Labs:   Lab Results  Component Value Date   WBC 7.8 03/26/2019   HGB 14.5 03/26/2019   HCT 44.6 03/26/2019   MCV 89.6 03/26/2019   PLT 288 03/26/2019    Recent Labs  Lab 03/26/19 0559  NA 138  K 4.1  CL 104  CO2 24  BUN 17  CREATININE 0.73  CALCIUM 9.0  GLUCOSE 214*   Lab Results  Component Value Date   TROPONINI 1.41 (HH) 02/21/2018    Lab Results  Component Value Date   CHOL 216 (H) 08/19/2018   CHOL 184 02/09/2018   CHOL 203 (H) 01/22/2017   Lab Results  Component Value Date   HDL 44 08/19/2018   HDL 46 02/09/2018   HDL 46 01/22/2017   Lab Results  Component Value Date   LDLCALC 131 (H) 08/19/2018   LDLCALC 103 (H) 02/09/2018   LDLCALC  105 (H) 01/22/2017   Lab Results  Component Value Date   TRIG 204 (H) 08/19/2018   TRIG 174 (H) 02/09/2018   TRIG 260 (H) 01/22/2017   No results found for: CHOLHDL No results found for: LDLDIRECT    Radiology: Dg Chest 2 View  Result Date: 03/24/2019 CLINICAL DATA:  Chest pain EXAM: CHEST - 2 VIEW COMPARISON:  02/20/2018 FINDINGS: The heart size and mediastinal contours are within normal limits. Both lungs are  clear. The visualized skeletal structures are unremarkable. IMPRESSION: No active cardiopulmonary disease. Electronically Signed   By: Donavan Foil M.D.   On: 03/24/2019 16:57    EKG: Normal sinus rhythm nonspecific ST-T wave changes  ASSESSMENT AND PLAN:  1 possible non-STEMI Elevated troponin Possible myocarditis Obesity Chest pain possible angina Diabetes Hyperlipidemia Anxiety depression . Plan Agree with admit to telemetry R/O myocardial infarction Follow-up EKGs and troponins Recommend echocardiogram to assess for left ventricular dysfunction Continue diabetes management control Lipid management and control Short-term anticoagulation with heparin Consider cardiac cath versus functional study prior to discharge   Signed: Yolonda Kida MD,  03/26/2019, 7:57 AM

## 2019-03-26 NOTE — Progress Notes (Signed)
ANTICOAGULATION CONSULT NOTE   Pharmacy Consult for Heparin drip Indication: chest pain/ACS  No Known Allergies  Patient Measurements: Height: 5\' 11"  (180.3 cm) Weight: 247 lb 9.6 oz (112.3 kg) IBW/kg (Calculated) : 75.3 Heparin Dosing Weight: 99.9 kg  Vital Signs: Temp: 98.6 F (37 C) (12/06 0348) Temp Source: Oral (12/06 0348) BP: 107/62 (12/06 0348) Pulse Rate: 57 (12/06 0348)  Labs: Recent Labs    03/24/19 1627  03/24/19 2017 03/25/19 0409  03/25/19 1302 03/25/19 1608 03/25/19 1839 03/25/19 2346 03/26/19 0559  HGB 16.0  --   --  14.7  --   --   --   --   --  14.5  HCT 46.1  --   --  44.2  --   --   --   --   --  44.6  PLT 295  --   --  263  --   --   --   --   --  288  APTT  --   --  24  --   --   --   --   --   --   --   LABPROT  --   --  12.7  --   --   --   --   --   --   --   INR  --   --  1.0  --   --   --   --   --   --   --   HEPARINUNFRC  --   --   --   --    < >  --  0.19*  --  0.64 0.87*  CREATININE 0.80  --   --   --   --   --   --   --   --  0.73  TROPONINIHS 11   < >  --   --   --  1,234* 1,072* 1,145*  --   --    < > = values in this interval not displayed.    Estimated Creatinine Clearance: 140.8 mL/min (by C-G formula based on SCr of 0.73 mg/dL).   Medical History: Past Medical History:  Diagnosis Date  . Agoraphobia   . Allergy   . Anxiety   . Depression   . Diabetes mellitus without complication ([place])   . Hyperlipidemia   . Migraines     Medications:  Scheduled:  . aspirin EC  81 mg Oral Daily  . atorvastatin  80 mg Oral q1800  . buPROPion  150 mg Oral Daily  . escitalopram  20 mg Oral QHS  . influenza vac split quadrivalent PF  0.5 mL Intramuscular Tomorrow-1000  . insulin aspart  0-15 Units Subcutaneous Q6H  . insulin glargine  5 Units Subcutaneous Daily  . metoprolol tartrate  25 mg Oral BID  . multivitamin with minerals  1 tablet Oral Daily  . traZODone  100 mg Oral QHS   Infusions:  . heparin 2,100 Units/hr (03/25/19  2200)    Assessment: 48 yo M to start Heparin drip for ACS/STEMI. No anticoag PTA per Med Rec Hgb 16.0>> 14.7   Plt 295>> 263  12/4 Heparin infusion started @ 1400 units/hr 12/5 @ 0559 HL: 0.18. Infusion increased to 1700 units/hr 12/5: HL @ 1608 = 0.19- Infusion increased to 2100 unit/hr 12/5 @ 2346 HL: 0.64. Level is therapeutic x1. 12/6 @ 0559 HL: 0.87.     Goal of Therapy:  Heparin level 0.3-0.7 units/ml Monitor platelets by  anticoagulation protocol: Yes   Plan:  12/6 @ 0559 HL: 0.87. Level supratherapeutic. Hgb/Plts stable.  Will reduce heparin infusion to rate of 1900 units/hr.  Recheck  anti-Xa level in 6 hours and daily while on heparin. Continue to monitor H&H and platelets  Pernell Dupre, PharmD, BCPS Clinical Pharmacist 03/26/2019 6:50 AM

## 2019-03-26 NOTE — Progress Notes (Signed)
ANTICOAGULATION CONSULT NOTE   Pharmacy Consult for Heparin drip Indication: chest pain/ACS  No Known Allergies  Patient Measurements: Height: 5\' 11"  (180.3 cm) Weight: 250 lb (113.4 kg) IBW/kg (Calculated) : 75.3 Heparin Dosing Weight: 99.9 kg  Vital Signs: Temp: 98.1 F (36.7 C) (12/05 2125) Temp Source: Oral (12/05 2125) BP: 121/84 (12/05 2125) Pulse Rate: 64 (12/05 2125)  Labs: Recent Labs    03/24/19 1627  03/24/19 2017 03/25/19 0409 03/25/19 0559 03/25/19 1302 03/25/19 1608 03/25/19 1839 03/25/19 2346  HGB 16.0  --   --  14.7  --   --   --   --   --   HCT 46.1  --   --  44.2  --   --   --   --   --   PLT 295  --   --  263  --   --   --   --   --   APTT  --   --  24  --   --   --   --   --   --   LABPROT  --   --  12.7  --   --   --   --   --   --   INR  --   --  1.0  --   --   --   --   --   --   HEPARINUNFRC  --   --   --   --  0.18*  --  0.19*  --  0.64  CREATININE 0.80  --   --   --   --   --   --   --   --   TROPONINIHS 11   < >  --   --   --  1,234* 1,072* 1,145*  --    < > = values in this interval not displayed.    Estimated Creatinine Clearance: 141.4 mL/min (by C-G formula based on SCr of 0.8 mg/dL).   Medical History: Past Medical History:  Diagnosis Date  . Agoraphobia   . Allergy   . Anxiety   . Depression   . Diabetes mellitus without complication ([place])   . Hyperlipidemia   . Migraines     Medications:  Scheduled:  . aspirin EC  81 mg Oral Daily  . atorvastatin  80 mg Oral q1800  . buPROPion  150 mg Oral Daily  . escitalopram  20 mg Oral QHS  . influenza vac split quadrivalent PF  0.5 mL Intramuscular Tomorrow-1000  . insulin aspart  0-15 Units Subcutaneous Q6H  . insulin glargine  5 Units Subcutaneous Daily  . metoprolol tartrate  25 mg Oral BID  . multivitamin with minerals  1 tablet Oral Daily  . traZODone  100 mg Oral QHS   Infusions:  . heparin 2,100 Units/hr (03/25/19 2200)    Assessment: 48 yo M to start Heparin  drip for ACS/STEMI. No anticoag PTA per Med Rec Hgb 16.0>> 14.7   Plt 295>> 263  12/4 Heparin infusion started @ 1400 units/hr 12/5 @ 0559 HL: 0.18. Infusion increased to 1700 units/hr 12/5: HL @ 1608 = 0.19- Infusion increased to 2100 unit/hr   Goal of Therapy:  Heparin level 0.3-0.7 units/ml Monitor platelets by anticoagulation protocol: Yes   Plan:  12/5 @ 2346 HL: 0.64. Level is therapeutic x1.  Continue current infusion rate of 2100 units/hr.  Recheck confirmatory anti-Xa level in 6 hours and daily while on heparin Continue to  monitor H&H and platelets  Pernell Dupre, PharmD, BCPS Clinical Pharmacist 03/26/2019 1:16 AM

## 2019-03-26 NOTE — Progress Notes (Signed)
ANTICOAGULATION CONSULT NOTE   Pharmacy Consult for Heparin drip Indication: chest pain/ACS  No Known Allergies  Patient Measurements: Height: 5\' 11"  (180.3 cm) Weight: 247 lb 9.6 oz (112.3 kg) IBW/kg (Calculated) : 75.3 Heparin Dosing Weight: 99.9 kg  Vital Signs: Temp: 98.3 F (36.8 C) (12/06 1947) Temp Source: Oral (12/06 1947) BP: 119/88 (12/06 1947) Pulse Rate: 66 (12/06 1947)  Labs: Recent Labs    03/24/19 1627  03/24/19 2017 03/25/19 0409  03/25/19 1839  03/26/19 0559 03/26/19 1126 03/26/19 1254 03/26/19 2027  HGB 16.0  --   --  14.7  --   --   --  14.5  --   --   --   HCT 46.1  --   --  44.2  --   --   --  44.6  --   --   --   PLT 295  --   --  263  --   --   --  288  --   --   --   APTT  --   --  24  --   --   --   --   --   --   --   --   LABPROT  --   --  12.7  --   --   --   --   --   --   --   --   INR  --   --  1.0  --   --   --   --   --   --   --   --   HEPARINUNFRC  --   --   --   --    < >  --    < > 0.87*  --  0.71* 0.62  CREATININE 0.80  --   --   --   --   --   --  0.73  --   --   --   TROPONINIHS 11   < >  --   --    < > 1,145*  --  865* 629*  --   --    < > = values in this interval not displayed.    Estimated Creatinine Clearance: 140.8 mL/min (by C-G formula based on SCr of 0.73 mg/dL).   Medical History: Past Medical History:  Diagnosis Date  . Agoraphobia   . Allergy   . Anxiety   . Depression   . Diabetes mellitus without complication ([place])   . Hyperlipidemia   . Migraines     Medications:  Scheduled:  . aspirin EC  81 mg Oral Daily  . atorvastatin  80 mg Oral q1800  . buPROPion  150 mg Oral Daily  . escitalopram  20 mg Oral QHS  . influenza vac split quadrivalent PF  0.5 mL Intramuscular Tomorrow-1000  . [START ON 03/27/2019] influenza vac split quadrivalent PF  0.5 mL Intramuscular Tomorrow-1000  . insulin aspart  0-15 Units Subcutaneous Q6H  . insulin glargine  5 Units Subcutaneous Daily  . metoprolol tartrate  25 mg  Oral BID  . multivitamin with minerals  1 tablet Oral Daily  . sodium chloride flush  3 mL Intravenous Q12H  . traZODone  100 mg Oral QHS   Infusions:  . heparin 1,850 Units/hr (03/26/19 1352)    Assessment: 48 yo M to start Heparin drip for ACS/STEMI. No anticoag PTA per Med Rec Hgb 16.0>> 14.7   Plt 295>> 263  12/4 Heparin  infusion started @ 1400 units/hr 12/5 @ 0559 HL: 0.18. Infusion increased to 1700 units/hr 12/5: HL @ 1608 = 0.19- Infusion increased to 2100 unit/hr 12/5 @ 2346 HL: 0.64. Level is therapeutic x1. 12/6 @ 0559 HL: 0.87. reduced to 1900 units/hr  12/6 @ 1259 HL: 0.71  12/6 @ 2027 HL: 0.62   Goal of Therapy:  Heparin level 0.3-0.7 units/ml Monitor platelets by anticoagulation protocol: Yes   Plan:  12/6 @ 2027 HL: 0.62. Heparin level therapeutic x1. Hgb/Plts stable.  Continue current infusion rate of 1850 units/hr.  Recheck  anti-Xa level in 6 hours and daily CBC while on heparin.  Pernell Dupre, PharmD, BCPS Clinical Pharmacist 03/26/2019 10:06 PM

## 2019-03-26 NOTE — Progress Notes (Signed)
ANTICOAGULATION CONSULT NOTE   Pharmacy Consult for Heparin drip Indication: chest pain/ACS  No Known Allergies  Patient Measurements: Height: 5\' 11"  (180.3 cm) Weight: 247 lb 9.6 oz (112.3 kg) IBW/kg (Calculated) : 75.3 Heparin Dosing Weight: 99.9 kg  Vital Signs: Temp: 98.5 F (36.9 C) (12/06 0817) Temp Source: Oral (12/06 0817) BP: 106/86 (12/06 0817) Pulse Rate: 67 (12/06 0946)  Labs: Recent Labs    03/24/19 1627  03/24/19 2017 03/25/19 0409  03/25/19 1608 03/25/19 1839 03/25/19 2346 03/26/19 0559 03/26/19 1254  HGB 16.0  --   --  14.7  --   --   --   --  14.5  --   HCT 46.1  --   --  44.2  --   --   --   --  44.6  --   PLT 295  --   --  263  --   --   --   --  288  --   APTT  --   --  24  --   --   --   --   --   --   --   LABPROT  --   --  12.7  --   --   --   --   --   --   --   INR  --   --  1.0  --   --   --   --   --   --   --   HEPARINUNFRC  --   --   --   --    < > 0.19*  --  0.64 0.87* 0.71*  CREATININE 0.80  --   --   --   --   --   --   --  0.73  --   TROPONINIHS 11   < >  --   --    < > 1,072* 1,145*  --  865*  --    < > = values in this interval not displayed.    Estimated Creatinine Clearance: 140.8 mL/min (by C-G formula based on SCr of 0.73 mg/dL).   Medical History: Past Medical History:  Diagnosis Date  . Agoraphobia   . Allergy   . Anxiety   . Depression   . Diabetes mellitus without complication ([place])   . Hyperlipidemia   . Migraines     Medications:  Scheduled:  . aspirin EC  81 mg Oral Daily  . atorvastatin  80 mg Oral q1800  . buPROPion  150 mg Oral Daily  . escitalopram  20 mg Oral QHS  . influenza vac split quadrivalent PF  0.5 mL Intramuscular Tomorrow-1000  . [START ON 03/27/2019] influenza vac split quadrivalent PF  0.5 mL Intramuscular Tomorrow-1000  . insulin aspart  0-15 Units Subcutaneous Q6H  . insulin glargine  5 Units Subcutaneous Daily  . metoprolol tartrate  25 mg Oral BID  . multivitamin with minerals  1  tablet Oral Daily  . sodium chloride flush  3 mL Intravenous Q12H  . traZODone  100 mg Oral QHS   Infusions:  . heparin 1,900 Units/hr (03/26/19 1050)    Assessment: 48 yo M to start Heparin drip for ACS/STEMI. No anticoag PTA per Med Rec Hgb 16.0>> 14.7   Plt 295>> 263  12/4 Heparin infusion started @ 1400 units/hr 12/5 @ 0559 HL: 0.18. Infusion increased to 1700 units/hr 12/5: HL @ 1608 = 0.19- Infusion increased to 2100 unit/hr 12/5 @ 2346 HL: 0.64.  Level is therapeutic x1. 12/6 @ 0559 HL: 0.87. reduced to 1900 units/hr  12/6 @ 1259 HL: 0.71      Goal of Therapy:  Heparin level 0.3-0.7 units/ml Monitor platelets by anticoagulation protocol: Yes   Plan:  Heparin level supratherapeutic. Hgb/Plts stable.  Will reduce heparin infusion to rate of 1850 units/hr.  Recheck  anti-Xa level in 6 hours and daily CBC while on heparin.   Oswald Hillock, PharmD, BCPS Clinical Pharmacist 03/26/2019 1:36 PM

## 2019-03-27 ENCOUNTER — Encounter: Payer: Self-pay | Admitting: Internal Medicine

## 2019-03-27 ENCOUNTER — Encounter
Admission: EM | Disposition: A | Discharge status: Home / Self Care | Payer: Self-pay | Source: Home / Self Care | Attending: Internal Medicine

## 2019-03-27 HISTORY — PX: LEFT HEART CATH AND CORONARY ANGIOGRAPHY: CATH118249

## 2019-03-27 LAB — BASIC METABOLIC PANEL
Anion gap: 7 (ref 5–15)
BUN: 20 mg/dL (ref 6–20)
CO2: 24 mmol/L (ref 22–32)
Calcium: 8.7 mg/dL — ABNORMAL LOW (ref 8.9–10.3)
Chloride: 105 mmol/L (ref 98–111)
Creatinine, Ser: 0.76 mg/dL (ref 0.61–1.24)
GFR calc Af Amer: >60 mL/min (ref >60)
GFR calc non Af Amer: >60 mL/min (ref >60)
Glucose, Bld: 182 mg/dL — ABNORMAL HIGH (ref 70–99)
Potassium: 3.6 mmol/L (ref 3.5–5.1)
Sodium: 136 mmol/L (ref 135–145)

## 2019-03-27 LAB — CBC WITH DIFFERENTIAL/PLATELET
Abs Immature Granulocytes: 0.02 10*3/uL (ref 0.00–0.07)
Basophils Absolute: 0.1 10*3/uL (ref 0.0–0.1)
Basophils Relative: 1 %
Eosinophils Absolute: 0.9 10*3/uL — ABNORMAL HIGH (ref 0.0–0.5)
Eosinophils Relative: 11 %
HCT: 42.5 % (ref 39.0–52.0)
Hemoglobin: 13.8 g/dL (ref 13.0–17.0)
Immature Granulocytes: 0 %
Lymphocytes Relative: 41 %
Lymphs Abs: 3.3 10*3/uL (ref 0.7–4.0)
MCH: 29.1 pg (ref 26.0–34.0)
MCHC: 32.5 g/dL (ref 30.0–36.0)
MCV: 89.7 fL (ref 80.0–100.0)
Monocytes Absolute: 0.8 10*3/uL (ref 0.1–1.0)
Monocytes Relative: 10 %
Neutro Abs: 3 10*3/uL (ref 1.7–7.7)
Neutrophils Relative %: 37 %
Platelets: 276 10*3/uL (ref 150–400)
RBC: 4.74 MIL/uL (ref 4.22–5.81)
RDW: 12.7 % (ref 11.5–15.5)
WBC: 8.1 10*3/uL (ref 4.0–10.5)
nRBC: 0 % (ref 0.0–0.2)

## 2019-03-27 LAB — HEPARIN LEVEL (UNFRACTIONATED)
Heparin Unfractionated: 0.47 IU/mL (ref 0.30–0.70)
Heparin Unfractionated: 0.53 IU/mL (ref 0.30–0.70)

## 2019-03-27 LAB — GLUCOSE, CAPILLARY
Glucose-Capillary: 175 mg/dL — ABNORMAL HIGH (ref 70–99)
Glucose-Capillary: 265 mg/dL — ABNORMAL HIGH (ref 70–99)

## 2019-03-27 SURGERY — LEFT HEART CATH AND CORONARY ANGIOGRAPHY
Anesthesia: Moderate Sedation

## 2019-03-27 MED ORDER — LABETALOL HCL 5 MG/ML IV SOLN: 10.0000 mg | INTRAVENOUS | Status: DC | PRN

## 2019-03-27 MED ORDER — HEPARIN SODIUM (PORCINE) 1000 UNIT/ML IJ SOLN
INTRAMUSCULAR | Status: DC | PRN
  Administered 2019-03-27: 5000 [IU] via INTRAVENOUS

## 2019-03-27 MED ORDER — FENTANYL CITRATE (PF) 100 MCG/2ML IJ SOLN
INTRAMUSCULAR | Status: DC | PRN
  Administered 2019-03-27: 25 ug via INTRAVENOUS

## 2019-03-27 MED ORDER — HEPARIN (PORCINE) IN NACL 1000-0.9 UT/500ML-% IV SOLN
INTRAVENOUS | Status: AC
  Filled 2019-03-27: qty 1000

## 2019-03-27 MED ORDER — ASPIRIN EC 325 MG PO TBEC: 325.0000 mg | DELAYED_RELEASE_TABLET | Freq: Every day | ORAL | Status: DC

## 2019-03-27 MED ORDER — HYDRALAZINE HCL 20 MG/ML IJ SOLN: 10.0000 mg | INTRAMUSCULAR | Status: DC | PRN

## 2019-03-27 MED ORDER — HEPARIN SODIUM (PORCINE) 1000 UNIT/ML IJ SOLN
INTRAMUSCULAR | Status: AC
  Filled 2019-03-27: qty 1

## 2019-03-27 MED ORDER — MIDAZOLAM HCL 2 MG/2ML IJ SOLN
INTRAMUSCULAR | Status: AC
  Filled 2019-03-27: qty 2

## 2019-03-27 MED ORDER — ASPIRIN 81 MG PO TBEC: 81.0000 mg | DELAYED_RELEASE_TABLET | Freq: Every day | ORAL | Status: AC

## 2019-03-27 MED ORDER — ATORVASTATIN CALCIUM 80 MG PO TABS: 80.0000 mg | ORAL_TABLET | Freq: Every day | ORAL | 0 refills | Status: DC

## 2019-03-27 MED ORDER — MIDAZOLAM HCL 2 MG/2ML IJ SOLN
INTRAMUSCULAR | Status: DC | PRN
  Administered 2019-03-27: 1 mg via INTRAVENOUS

## 2019-03-27 MED ORDER — HEPARIN (PORCINE) IN NACL 1000-0.9 UT/500ML-% IV SOLN
INTRAVENOUS | Status: DC | PRN
  Administered 2019-03-27: 500 mL

## 2019-03-27 MED ORDER — SODIUM CHLORIDE 0.9 % WEIGHT BASED INFUSION: 1.0000 mL/kg/h | INTRAVENOUS | Status: DC

## 2019-03-27 MED ORDER — IOHEXOL 300 MG/ML  SOLN
INTRAMUSCULAR | Status: DC | PRN
  Administered 2019-03-27: 100 mL

## 2019-03-27 MED ORDER — SODIUM CHLORIDE 0.9% FLUSH: 3.0000 mL | Freq: Two times a day (BID) | INTRAVENOUS | Status: DC

## 2019-03-27 MED ORDER — ONDANSETRON HCL 4 MG/2ML IJ SOLN: 4.0000 mg | Freq: Four times a day (QID) | INTRAMUSCULAR | Status: DC | PRN

## 2019-03-27 MED ORDER — CLOPIDOGREL BISULFATE 75 MG PO TABS: 75.0000 mg | ORAL_TABLET | Freq: Every day | ORAL | Status: DC

## 2019-03-27 MED ORDER — VERAPAMIL HCL 2.5 MG/ML IV SOLN
INTRAVENOUS | Status: AC
  Filled 2019-03-27: qty 2

## 2019-03-27 MED ORDER — ACETAMINOPHEN 325 MG PO TABS: 650.0000 mg | ORAL_TABLET | ORAL | Status: DC | PRN

## 2019-03-27 MED ORDER — ASPIRIN 81 MG PO CHEW: 81.0000 mg | CHEWABLE_TABLET | Freq: Every day | ORAL | Status: DC

## 2019-03-27 MED ORDER — CLOPIDOGREL BISULFATE 75 MG PO TABS: 75.0000 mg | ORAL_TABLET | Freq: Every day | ORAL | 0 refills | Status: AC

## 2019-03-27 MED ORDER — SODIUM CHLORIDE 0.9 % IV SOLN: 250.0000 mL | INTRAVENOUS | Status: DC | PRN

## 2019-03-27 MED ORDER — FENTANYL CITRATE (PF) 100 MCG/2ML IJ SOLN
INTRAMUSCULAR | Status: AC
  Filled 2019-03-27: qty 2

## 2019-03-27 MED ORDER — VERAPAMIL HCL 2.5 MG/ML IV SOLN
INTRAVENOUS | Status: DC | PRN
  Administered 2019-03-27: 2.5 mg via INTRAVENOUS

## 2019-03-27 MED ORDER — SODIUM CHLORIDE 0.9% FLUSH: 3.0000 mL | INTRAVENOUS | Status: DC | PRN

## 2019-03-27 SURGICAL SUPPLY — 9 items
CATH INFINITI 5 FR JL3.5 (CATHETERS) ×3 IMPLANT
CATH INFINITI JR4 5F (CATHETERS) ×3 IMPLANT
DEVICE RAD TR BAND REGULAR (VASCULAR PRODUCTS) ×3 IMPLANT
GLIDESHEATH SLEND SS 6F .021 (SHEATH) ×3 IMPLANT
GUIDEWIRE .025 260CM (WIRE) ×3 IMPLANT
KIT MANI 3VAL PERCEP (MISCELLANEOUS) ×3 IMPLANT
PACK CARDIAC CATH (CUSTOM PROCEDURE TRAY) ×3 IMPLANT
WIRE HITORQ VERSACORE ST 145CM (WIRE) ×3 IMPLANT
WIRE ROSEN-J .035X260CM (WIRE) ×3 IMPLANT

## 2019-03-27 NOTE — Progress Notes (Signed)
ANTICOAGULATION CONSULT NOTE   Pharmacy Consult for Heparin drip Indication: chest pain/ACS  No Known Allergies  Patient Measurements: Height: 5\' 11"  (180.3 cm) Weight: 244 lb 12.8 oz (111 kg) IBW/kg (Calculated) : 75.3 Heparin Dosing Weight: 99.9 kg  Vital Signs: Temp: 98 F (36.7 C) (12/07 0444) Temp Source: Oral (12/07 0444) BP: 98/61 (12/07 0444) Pulse Rate: 51 (12/07 0444)  Labs: Recent Labs    03/24/19 1627  03/24/19 2017 03/25/19 0409  03/25/19 1839  03/26/19 0559 03/26/19 1126  03/26/19 2027 03/26/19 2336 03/27/19 0521  HGB 16.0  --   --  14.7  --   --   --  14.5  --   --   --   --  13.8  HCT 46.1  --   --  44.2  --   --   --  44.6  --   --   --   --  42.5  PLT 295  --   --  263  --   --   --  288  --   --   --   --  276  APTT  --   --  24  --   --   --   --   --   --   --   --   --   --   LABPROT  --   --  12.7  --   --   --   --   --   --   --   --   --   --   INR  --   --  1.0  --   --   --   --   --   --   --   --   --   --   HEPARINUNFRC  --   --   --   --    < >  --    < > 0.87*  --    < > 0.62 0.47 0.53  CREATININE 0.80  --   --   --   --   --   --  0.73  --   --   --   --   --   TROPONINIHS 11   < >  --   --    < > 1,145*  --  865* 629*  --   --   --   --    < > = values in this interval not displayed.    Estimated Creatinine Clearance: 140 mL/min (by C-G formula based on SCr of 0.73 mg/dL).   Medical History: Past Medical History:  Diagnosis Date  . Agoraphobia   . Allergy   . Anxiety   . Depression   . Diabetes mellitus without complication ([place])   . Hyperlipidemia   . Migraines     Medications:  Scheduled:  . aspirin  81 mg Oral Pre-Cath  . aspirin EC  81 mg Oral Daily  . atorvastatin  80 mg Oral q1800  . buPROPion  150 mg Oral Daily  . escitalopram  20 mg Oral QHS  . influenza vac split quadrivalent PF  0.5 mL Intramuscular Tomorrow-1000  . influenza vac split quadrivalent PF  0.5 mL Intramuscular Tomorrow-1000  . insulin aspart   0-15 Units Subcutaneous Q6H  . insulin glargine  5 Units Subcutaneous Daily  . metoprolol tartrate  25 mg Oral BID  . multivitamin with minerals  1 tablet Oral Daily  . sodium  chloride flush  3 mL Intravenous Q12H  . traZODone  100 mg Oral QHS   Infusions:  . sodium chloride    . sodium chloride 1 mL/kg/hr (03/27/19 0504)  . heparin 1,850 Units/hr (03/27/19 0449)    Assessment: 48 yo M to start Heparin drip for ACS/STEMI. No anticoag PTA per Med Rec Hgb 16.0>> 14.7   Plt 295>> 263  12/4 Heparin infusion started @ 1400 units/hr 12/5 @ 0559 HL: 0.18. Infusion increased to 1700 units/hr 12/5: HL @ 1608 = 0.19- Infusion increased to 2100 unit/hr 12/5 @ 2346 HL: 0.64. Level is therapeutic x1. 12/6 @ 0559 HL: 0.87. reduced to 1900 units/hr  12/6 @ 1259 HL: 0.71  12/6 @ 2027 HL: 0.62 -Therapeutic x1 12/7 @ 0521 HL: 0.53 Therapeutic x2   Goal of Therapy:  Heparin level 0.3-0.7 units/ml Monitor platelets by anticoagulation protocol: Yes   Plan:  12/7 @ 0521 HL: 0.53 Therapeutic x2. Hgb/Plts stable.  Continue current infusion rate of 1850 units/hr.  Check anti-Xa level and CBC daily while on heparin.  Pernell Dupre, PharmD, BCPS Clinical Pharmacist 03/27/2019 5:41 AM

## 2019-03-27 NOTE — Plan of Care (Signed)

## 2019-03-27 NOTE — Progress Notes (Addendum)
Inpatient Diabetes Program Recommendations  AACE/ADA: New Consensus Statement on Inpatient Glycemic Control (2015)  Target Ranges:  Prepandial:   less than 140 mg/dL      Peak postprandial:   less than 180 mg/dL (1-2 hours)      Critically ill patients:  140 - 180 mg/dL   Results for GLYNDON, TURSI (MRN [id number]) as of 03/27/2019 09:34  Ref. Range 03/26/2019 08:18 03/26/2019 11:36 03/26/2019 17:26 03/26/2019 23:23  Glucose-Capillary Latest Ref Range: 70 - 99 mg/dL 195 (H)  3 units NOVOLOG  192 (H)  3 units NOVOLOG  175 (H)  3 units NOVOLOG  271 (H)  8 units NOVOLOG +  5 units LANTUS    Results for JACARIUS, HANDEL (MRN [id number]) as of 03/27/2019 09:34  Ref. Range 03/27/2019 06:03  Glucose-Capillary Latest Ref Range: 70 - 99 mg/dL 175 (H)  3 units NOVOLOG    Results for KIERRE, HINTZ (MRN [id number]) as of 03/27/2019 09:34  Ref. Range 03/24/2019 23:51  Hemoglobin A1C Latest Ref Range: 4.8 - 5.6 % 10.1 (H)  (243 mg/dl)    Admit with: CP/ Elevated troponin concerning for ACS/non-STEMI  History: DM  Home DM Meds: Jardiance 10 mg Daily (NOT taking)       Metformin 1000 mg BID       Ertugliflozin/ L-PyroglutamicAc (Steglatro) 15 mg Daily  Current Orders: Lantus 5 units Daily      Novolog Moderate Correction Scale/ SSI (0-15 units) Q6 hours     MD- Please change Novolog SSi to TID ac + hs after patient returns from Cardiac Cath today (currently ordered Q6 hours)  Met with patient today.  Has been taking Metformin inconsistently at home--about 70% of the time) and has not started the [facility] at home yet.  Has issues with obtaining from pharmacy.  Patient states he plans to follow up with his PCP after d/c and his PCP may have him start GLP-1 injectable if the Metformin and Stegaltro do not bring his CBGs and A1c down.      PCP: Dr. Park Liter [facility])  Note A1c elevated.  Has been working with the Chronic Care Management team through PCP office  ([place]).  Unable to afford Jardiance.  Was placed on Steglatro instead 02/22/2019.  CBGs fairly stable on current regimen.    Addendum 12:25pm--Met with patient today.  Has been taking Metformin inconsistently at home--(about 70% of the time) and has not started the [facility] at home yet.  Has issues with obtaining from pharmacy.  Patient states he plans to follow up with his PCP after d/c and his PCP may have him start GLP-1 injectable if the Metformin and Stegaltro do not bring his CBGs and A1c down.  Plans to restart the [facility] as soon as he gets home.  Stated to me his PCP has mentioned adding GLP-1 injectable to home regimen if the oral meds do not help his A1c and CBGs at home.  Discussed with pt what a GLP-1 med is and how it works.  Pt asked me why his CBGs can run high even when he doesn't eat.  Discussed with pt that the liver tends to overproduce glucose even when the body doesn't need it with Type 2 diabetes.  Also discussed how stress and illness can also lead to elevated CBGs.  Reviewed goal A1c and goal CBGs for home.  Encouraged pt to check his CBGs at home (encouraged CBGs fasting in the AM several times per week and occasional  2 hour postmeal CBGs as well).  Also strongly encouraged regular exercise and adjustments to nutrition plan at home to eliminate all sugar and calories from beverages and to try to limit carbohydrates to 60-75 grams per meal per day (3 meals per day).     --Will follow patient during hospitalization--  Wyn Quaker RN, MSN, CDE Diabetes Coordinator Inpatient Glycemic Control Team Team Pager: [phone] (8a-5p)

## 2019-03-27 NOTE — Discharge Instructions (Signed)
Fingerstick glucose (sugar) goals for home: Before meals: 80-130 mg/dl 2-Hours after meals: less than 180 mg/dl Hemoglobin A1c goal: 7% or less  Diabetes Diet Info: Avoid beverages with sugar (regular soda, sweet tea, lemonade, fruit juice) and consume mostly water Men should have 60-75 grams of carbohydrates per meal per day 1-2 snacks per day (15 grams carbohydrates or less)

## 2019-03-27 NOTE — Discharge Summary (Addendum)
Physician Discharge Summary  Glenn Flynn [id number] DOB: 08/13/1970 DOA: 03/24/2019  PCP: Valerie Roys, DO  Admit date: 03/24/2019 Discharge date: 03/27/2019  Discharge disposition: Home   Recommendations for Outpatient Follow-Up:   Follow-up with PCP and cardiologist as scheduled   Discharge Diagnosis:   Active Problems:   Ischemic chest pain ([place])   NSTEMI (non-ST elevated myocardial infarction) ([place])   Full code status    Discharge Condition: Stable.  Diet recommendation: low glucose, low fat diet  Code status: Full.    Hospital Course:   Glenn Flynn is a 48 year old man with medical history significant for diabetes mellitus, dyslipidemia, depression, anxiety, who presented to the hospital with midsternal chest pain and shortness of breath.  He was found to have significantly elevated troponins, up to 1,145.  He was started on IV heparin infusion for presumed acute non-ST elevation MI.  He was seen in consultation by the cardiologist and he underwent left heart catheterization on 03/27/2019.  Left heart cath showed normal coronary arteries.  2D echo showed EF of 50%.  All his symptoms have improved and he has been able to ambulate without any symptoms..  Cardiologist recommended dual antiplatelet therapy with aspirin and Plavix for minimum of 6 months.  His hemoglobin A1c is 10.1 which suggests uncontrolled diabetes mellitus.  Follow-up with PCP is recommended for continued management of diabetes mellitus.  Of note, patient said he was surprised that his coronary arteries were normal because he had a similar episode about a year ago where his heart enzymes were elevated but he was told that his coronary arteries were normal.  Discharge plan was discussed with the patient and his wife at the bedside.    Medical Consultants:    Cardiology   Discharge Exam:   Vitals:   03/27/19 1115 03/27/19 1134  BP: 117/74 116/71  Pulse: 60 62  Resp: (!) 21 19  Temp:   98.2 F (36.8 C)  SpO2: 93% 95%   Vitals:   03/27/19 1045 03/27/19 1100 03/27/19 1115 03/27/19 1134  BP: 118/71 110/70 117/74 116/71  Pulse: 65 68 60 62  Resp: 19 19 (!) 21 19  Temp:    98.2 F (36.8 C)  TempSrc:    Oral  SpO2: 93% 93% 93% 95%  Weight:      Height:         GEN: NAD SKIN: No rash EYES: EOMI ENT: MMM CV: RRR PULM: CTA B ABD: soft, obese, NT, +BS CNS: AAO x 3, non focal EXT: No edema or tenderness   The results of significant diagnostics from this hospitalization (including imaging, microbiology, ancillary and laboratory) are listed below for reference.     Procedures and Diagnostic Studies:   No results found.   Labs:   Basic Metabolic Panel: Recent Labs  Lab 03/24/19 1627 03/26/19 0559 03/27/19 0521  NA 136 138 136  K 4.5 4.1 3.6  CL 101 104 105  CO2 24 24 24   GLUCOSE 277* 214* 182*  BUN 16 17 20   CREATININE 0.80 0.73 0.76  CALCIUM 9.5 9.0 8.7*   GFR Estimated Creatinine Clearance: 139.8 mL/min (by C-G formula based on SCr of 0.76 mg/dL). Liver Function Tests: No results for input(s): AST, ALT, ALKPHOS, BILITOT, PROT, ALBUMIN in the last 168 hours. No results for input(s): LIPASE, AMYLASE in the last 168 hours. No results for input(s): AMMONIA in the last 168 hours. Coagulation profile Recent Labs  Lab 03/24/19 2017  INR 1.0  CBC: Recent Labs  Lab 03/24/19 1627 03/25/19 0409 03/26/19 0559 03/27/19 0521  WBC 7.7 7.8 7.8 8.1  NEUTROABS  --   --  3.2 3.0  HGB 16.0 14.7 14.5 13.8  HCT 46.1 44.2 44.6 42.5  MCV 85.5 87.9 89.6 89.7  PLT 295 263 288 276   Cardiac Enzymes: No results for input(s): CKTOTAL, CKMB, CKMBINDEX, TROPONINI in the last 168 hours. BNP: Invalid input(s): POCBNP CBG: Recent Labs  Lab 03/26/19 1136 03/26/19 1726 03/26/19 2323 03/27/19 0603 03/27/19 1136  GLUCAP 192* 175* 271* 175* 265*   D-Dimer No results for input(s): DDIMER in the last 72 hours. Hgb A1c Recent Labs    03/24/19 2351   HGBA1C 10.1*   Lipid Profile No results for input(s): CHOL, HDL, LDLCALC, TRIG, CHOLHDL, LDLDIRECT in the last 72 hours. Thyroid function studies No results for input(s): TSH, T4TOTAL, T3FREE, THYROIDAB in the last 72 hours.  Invalid input(s): FREET3 Anemia work up No results for input(s): VITAMINB12, FOLATE, FERRITIN, TIBC, IRON, RETICCTPCT in the last 72 hours. Microbiology Recent Results (from the past 240 hour(s))  SARS CORONAVIRUS 2 (TAT 6-24 HRS) Nasopharyngeal Nasopharyngeal Swab     Status: None   Collection Time: 03/24/19  8:17 PM   Specimen: Nasopharyngeal Swab  Result Value Ref Range Status   SARS Coronavirus 2 NEGATIVE NEGATIVE Final    Comment: (NOTE) SARS-CoV-2 target nucleic acids are NOT DETECTED. The SARS-CoV-2 RNA is generally detectable in upper and lower respiratory specimens during the acute phase of infection. Negative results do not preclude SARS-CoV-2 infection, do not rule out co-infections with other pathogens, and should not be used as the sole basis for treatment or other patient management decisions. Negative results must be combined with clinical observations, patient history, and epidemiological information. The expected result is Negative. Fact Sheet for Patients: SugarRoll.be Fact Sheet for Healthcare Providers: https://www.woods-mathews.com/ This test is not yet approved or cleared by the [place] FDA and  has been authorized for detection and/or diagnosis of SARS-CoV-2 by FDA under an Emergency Use Authorization (EUA). This EUA will remain  in effect (meaning this test can be used) for the duration of the COVID-19 declaration under Section 56 4(b)(1) of the Act, 21 U.S.C. section 360bbb-3(b)(1), unless the authorization is terminated or revoked sooner. Performed at [facility], [place] [address] [place], [place] [address]      Discharge Instructions:   Discharge Instructions    Diet  Carb Modified   Complete by: As directed    Increase activity slowly   Complete by: As directed      Allergies as of 03/27/2019   No Known Allergies     Medication List    STOP taking these medications   Jardiance 10 MG Tabs tablet Generic drug: empagliflozin     TAKE these medications   ALPRAZolam 0.5 MG tablet Commonly known as: XANAX Take 1 tablet (0.5 mg total) by mouth 2 (two) times daily as needed for anxiety.   buPROPion 150 MG 24 hr tablet Commonly known as: Wellbutrin XL Take 1 tablet (150 mg total) by mouth daily.   escitalopram 20 MG tablet Commonly known as: LEXAPRO Take 1 tablet (20 mg total) by mouth at bedtime.   metFORMIN 500 MG 24 hr tablet Commonly known as: GLUCOPHAGE-XR Take 2 tablets (1,000 mg total) by mouth 2 (two) times daily. Start taking on: March 29, 2019 What changed: These instructions start on March 29, 2019. If you are unsure what to do until then,  ask your doctor or other care provider.   multivitamin with minerals tablet Take 1 tablet by mouth daily.   Steglatro 15 MG Tabs Generic drug: Ertugliflozin L-PyroglutamicAc Take 15 mg by mouth daily before breakfast.   traZODone 100 MG tablet Commonly known as: DESYREL TAKE 1 TABLET BY MOUTH EVERYDAY AT BEDTIME   triamcinolone ointment 0.5 % Commonly known as: KENALOG Apply 1 application topically 2 (two) times daily.         Time coordinating discharge: 28 minutes  Signed:  Laquincy Eastridge  [facility] 03/27/2019, 2:06 PM

## 2019-03-27 NOTE — Progress Notes (Signed)
Patient discharged home today s/p LHC with radial approach, no complications, as site is unremarkable. Activity restrictions, radial site care information, medication, and follow up appointment information provided. No concerns expressed at this time.

## 2019-04-07 ENCOUNTER — Other Ambulatory Visit: Payer: Self-pay | Admitting: Family Medicine

## 2019-04-11 ENCOUNTER — Inpatient Hospital Stay: Payer: BC Managed Care – PPO | Admitting: Family Medicine

## 2019-04-26 ENCOUNTER — Other Ambulatory Visit: Payer: Self-pay | Admitting: Family Medicine

## 2019-04-26 NOTE — Telephone Encounter (Signed)
I'm not sure what's going on here. Can we check with patient/pharmacy

## 2019-04-26 NOTE — Telephone Encounter (Signed)
Please do PA

## 2019-04-26 NOTE — Telephone Encounter (Signed)
Catie did a visit with the patient the day after you saw him and this was the documentation. I guess we just need to close the encounter since the med was changed.    "Contacted [employer]. Jardiance would be $514/30 day supply, as patient has a $2200 prescription deductible for higher tier medications. Jardiance coupon card would be application, but only pays a max of $250/30 days, so medication would still be unaffordable."   "Invokana and Farxiga coupon cards, as well as GLP1 coupon cards, have similar max benefit. However, Steglatro coupon card pays up to $583/30 days. Patient may have had some adherence concerns when previously on Steglatro 5 mg daily. Collaborated w/ Dr. Wynetta Emery to send Steglatro 15 mg daily, and will collaborate w/ patient for re-signing up for [facility] coupon card."

## 2019-04-26 NOTE — Telephone Encounter (Signed)
Routing to provider  

## 2019-04-26 NOTE — Telephone Encounter (Signed)
Notes to clinic:  Alternative Requested:MEDICATION IS NOT COVERED. PA NEEDED.  Requested Prescriptions  Pending Prescriptions Disp Refills   JARDIANCE 25 MG TABS tablet [Pharmacy Med Name: JARDIANCE 25 MG TABLET]  0    Sig: Please specify directions, refills and quantity      Endocrinology:  Diabetes - SGLT2 Inhibitors Failed - 04/26/2019  8:25 AM      Failed - LDL in normal range and within 360 days    LDL Calculated  Date Value Ref Range Status  08/19/2018 131 (H) 0 - 99 mg/dL Final          Failed - HBA1C is between 0 and 7.9 and within 180 days    Hemoglobin A1C  Date Value Ref Range Status  04/16/2016 8.4  Final   HB A1C (BAYER DCA - WAIVED)  Date Value Ref Range Status  11/15/2018 8.3 (H) <7.0 % Final    Comment:                                          Diabetic Adult            <7.0                                       Healthy Adult        4.3 - 5.7                                                           (DCCT/NGSP) American Diabetes Association's Summary of Glycemic Recommendations for Adults with Diabetes: Hemoglobin A1c <7.0%. More stringent glycemic goals (A1c <6.0%) may further reduce complications at the cost of increased risk of hypoglycemia.    Hgb A1c MFr Bld  Date Value Ref Range Status  03/24/2019 10.1 (H) 4.8 - 5.6 % Final    Comment:    (NOTE) Pre diabetes:          5.7%-6.4% Diabetes:              >6.4% Glycemic control for   <7.0% adults with diabetes           Passed - Cr in normal range and within 360 days    Creatinine, Ser  Date Value Ref Range Status  03/27/2019 0.76 0.61 - 1.24 mg/dL Final          Passed - eGFR in normal range and within 360 days    GFR calc Af Amer  Date Value Ref Range Status  03/27/2019 >60 >60 mL/min Final   GFR calc non Af Amer  Date Value Ref Range Status  03/27/2019 >60 >60 mL/min Final          Passed - Valid encounter within last 6 months    Recent Outpatient Visits           2 months ago  Type 2 diabetes mellitus without complication, without long-term current use of insulin ([place])   [place], Megan P, DO   5 months ago Type 2 diabetes mellitus without complication, without long-term current use of insulin ([place])   Arnett  Johnson, Megan P, DO   8 months ago Type 2 diabetes mellitus without complication, without long-term current use of insulin ([place])   [place], Megan P, DO   9 months ago Contact dermatitis, unspecified contact dermatitis type, unspecified trigger   [employer], Megan P, DO   11 months ago Type 2 diabetes mellitus without complication, without long-term current use of insulin ([place])   [place], [place], DO       Future Appointments             In 4 weeks Wynetta Emery, Barb Merino, DO [employer], [facility]

## 2019-04-26 NOTE — Telephone Encounter (Signed)
Dr. Wynetta Emery, I noticed that this med I not on the current med list. Was the patient changed to the [facility] due to the cost of the [place]?

## 2019-05-05 LAB — HM DIABETES EYE EXAM

## 2019-05-16 DIAGNOSIS — H43823 Vitreomacular adhesion, bilateral: Secondary | ICD-10-CM | POA: Diagnosis not present

## 2019-05-16 DIAGNOSIS — H35031 Hypertensive retinopathy, right eye: Secondary | ICD-10-CM | POA: Diagnosis not present

## 2019-05-16 DIAGNOSIS — H33192 Other retinoschisis and retinal cysts, left eye: Secondary | ICD-10-CM | POA: Diagnosis not present

## 2019-05-16 DIAGNOSIS — E119 Type 2 diabetes mellitus without complications: Secondary | ICD-10-CM | POA: Diagnosis not present

## 2019-05-19 ENCOUNTER — Encounter: Payer: Self-pay | Admitting: Family Medicine

## 2019-05-24 ENCOUNTER — Ambulatory Visit: Payer: BC Managed Care – PPO | Admitting: Family Medicine

## 2019-05-26 ENCOUNTER — Ambulatory Visit (INDEPENDENT_AMBULATORY_CARE_PROVIDER_SITE_OTHER): Payer: BC Managed Care – PPO | Admitting: Family Medicine

## 2019-05-26 ENCOUNTER — Other Ambulatory Visit: Payer: Self-pay

## 2019-05-26 ENCOUNTER — Encounter: Payer: Self-pay | Admitting: Family Medicine

## 2019-05-26 VITALS — BP 125/71 | HR 66 | Temp 98.3°F | Wt 233.6 lb

## 2019-05-26 DIAGNOSIS — L509 Urticaria, unspecified: Secondary | ICD-10-CM

## 2019-05-26 DIAGNOSIS — F411 Generalized anxiety disorder: Secondary | ICD-10-CM

## 2019-05-26 DIAGNOSIS — E782 Mixed hyperlipidemia: Secondary | ICD-10-CM

## 2019-05-26 DIAGNOSIS — E119 Type 2 diabetes mellitus without complications: Secondary | ICD-10-CM

## 2019-05-26 LAB — UA/M W/RFLX CULTURE, ROUTINE
Bilirubin, UA: NEGATIVE
Leukocytes,UA: NEGATIVE
Nitrite, UA: NEGATIVE
Protein,UA: NEGATIVE
Specific Gravity, UA: 1.02 (ref 1.005–1.030)
Urobilinogen, Ur: 0.2 mg/dL (ref 0.2–1.0)
pH, UA: 5 (ref 5.0–7.5)

## 2019-05-26 LAB — MICROALBUMIN, URINE WAIVED
Creatinine, Urine Waived: 50 mg/dL (ref 10–300)
Microalb, Ur Waived: 10 mg/L (ref 0–19)
Microalb/Creat Ratio: <30 mg/g (ref <30)

## 2019-05-26 LAB — MICROSCOPIC EXAMINATION

## 2019-05-26 LAB — BAYER DCA HB A1C WAIVED: HB A1C (BAYER DCA - WAIVED): 8.2 % — ABNORMAL HIGH (ref <7.0)

## 2019-05-26 MED ORDER — PREDNISONE 10 MG PO TABS: ORAL_TABLET | ORAL | 0 refills | Status: DC

## 2019-05-26 NOTE — Assessment & Plan Note (Signed)
Sugars went up quite a bit up to 10.1. Down on recheck to 8.2. Will continue current regimen and recheck 3 months. Call with any concerns.

## 2019-05-26 NOTE — Assessment & Plan Note (Signed)
Rechecking levels today. Await results. Call with any concerns.  

## 2019-05-26 NOTE — Progress Notes (Signed)
BP 125/71 (BP Location: Left Arm, Patient Position: Sitting, Cuff Size: Normal)   Pulse 66   Temp 98.3 F (36.8 C) (Oral)   Wt 233 lb 9.6 oz (106 kg)   SpO2 96%   BMI 32.58 kg/m    Subjective:    Patient ID: Glenn Flynn, male    DOB: [birth year], 49 y.o.   MRN: [id number]  HPI: Glenn Flynn is a 49 y.o. male  Chief Complaint  Patient presents with  . Diabetes  . Hyperlipidemia  . Anxiety  . Rash   DIABETES Hypoglycemic episodes:no Polydipsia/polyuria: no Visual disturbance: no Chest pain: no Paresthesias: no Glucose Monitoring: yes  Accucheck frequency: Daily  Fasting glucose: 140-145 Taking Insulin?: no Blood Pressure Monitoring: not checking Retinal Examination: Up to Date Foot Exam: Up to Date Diabetic Education: Completed Pneumovax: Up to Date Influenza: Up to Date Aspirin: yes  RASH Duration:  Few weeks  Location: generalized  Itching: yes Burning: no Redness: yes Oozing: no Scaling: yes Blisters: no Painful: no Fevers: no Change in detergents/soaps/personal care products: no Recent illness: no Recent travel:no History of same: yes Context: stable Alleviating factors: lotion/moisturizer Treatments attempted:hydrocortisone cream, benadryl and nothing Shortness of breath: no  Throat/tongue swelling: no Myalgias/arthralgias: no  Relevant past medical, surgical, family and social history reviewed and updated as indicated. Interim medical history since our last visit reviewed. Allergies and medications reviewed and updated.  Review of Systems  Constitutional: Negative.   Respiratory: Negative.   Cardiovascular: Negative.   Genitourinary: Negative.   Musculoskeletal: Negative.   Skin: Positive for rash. Negative for color change, pallor and wound.  Neurological: Negative.   Psychiatric/Behavioral: Negative for agitation, behavioral problems, confusion, decreased concentration, dysphoric mood, hallucinations, self-injury, sleep  disturbance and suicidal ideas. The patient is nervous/anxious. The patient is not hyperactive.     Per HPI unless specifically indicated above     Objective:    BP 125/71 (BP Location: Left Arm, Patient Position: Sitting, Cuff Size: Normal)   Pulse 66   Temp 98.3 F (36.8 C) (Oral)   Wt 233 lb 9.6 oz (106 kg)   SpO2 96%   BMI 32.58 kg/m   Wt Readings from Last 3 Encounters:  05/26/19 233 lb 9.6 oz (106 kg)  03/27/19 244 lb (110.7 kg)  11/15/18 242 lb (109.8 kg)    Physical Exam Vitals and nursing note reviewed.  Constitutional:      General: He is not in acute distress.    Appearance: Normal appearance. He is not ill-appearing, toxic-appearing or diaphoretic.  HENT:     Head: Normocephalic and atraumatic.     Right Ear: External ear normal.     Left Ear: External ear normal.     Nose: Nose normal.     Mouth/Throat:     Mouth: Mucous membranes are moist.     Pharynx: Oropharynx is clear.  Eyes:     General: No scleral icterus.       Right eye: No discharge.        Left eye: No discharge.     Extraocular Movements: Extraocular movements intact.     Conjunctiva/sclera: Conjunctivae normal.     Pupils: Pupils are equal, round, and reactive to light.  Cardiovascular:     Rate and Rhythm: Normal rate and regular rhythm.     Pulses: Normal pulses.     Heart sounds: Normal heart sounds. No murmur. No friction rub. No gallop.   Pulmonary:     Effort: Pulmonary  effort is normal. No respiratory distress.     Breath sounds: Normal breath sounds. No stridor. No wheezing, rhonchi or rales.  Chest:     Chest wall: No tenderness.  Musculoskeletal:        General: Normal range of motion.     Cervical back: Normal range of motion and neck supple.  Skin:    General: Skin is warm and dry.     Capillary Refill: Capillary refill takes less than 2 seconds.     Coloration: Skin is not jaundiced or pale.     Findings: Rash (patches of erythematous excoriated skin on arms, and chest )  present. No bruising, erythema or lesion.  Neurological:     General: No focal deficit present.     Mental Status: He is alert and oriented to person, place, and time. Mental status is at baseline.  Psychiatric:        Mood and Affect: Mood normal.        Behavior: Behavior normal.        Thought Content: Thought content normal.        Judgment: Judgment normal.     Results for orders placed or performed in visit on 05/05/19  HM DIABETES EYE EXAM  Result Value Ref Range   HM Diabetic Eye Exam No Retinopathy No Retinopathy      Assessment & Plan:   Problem List Items Addressed This Visit      Endocrine   Type 2 diabetes mellitus ([place])    Sugars went up quite a bit up to 10.1. Down on recheck to 8.2. Will continue current regimen and recheck 3 months. Call with any concerns.         Other   Hyperlipidemia    Rechecking levels today. Await results. Call with any concerns.       Anxiety disorder    Does not need refill on his medicine right now. Will call if he needs one. Call with any concerns.        Other Visit Diagnoses    Hives    -  Primary   Of unclear etiology. Will treat with burst of prednisone. As this is the 2nd time they've come up, will refer to allergy.   Relevant Orders   Ambulatory referral to Allergy       Follow up plan: Return in about 3 months (around 08/23/2019).

## 2019-05-26 NOTE — Assessment & Plan Note (Signed)
Does not need refill on his medicine right now. Will call if he needs one. Call with any concerns.

## 2019-05-27 LAB — COMPREHENSIVE METABOLIC PANEL
ALT: 29 IU/L (ref 0–44)
AST: 18 IU/L (ref 0–40)
Albumin/Globulin Ratio: 1.8 (ref 1.2–2.2)
Albumin: 4.5 g/dL (ref 4.0–5.0)
Alkaline Phosphatase: 78 IU/L (ref 39–117)
BUN/Creatinine Ratio: 21 — ABNORMAL HIGH (ref 9–20)
BUN: 20 mg/dL (ref 6–24)
Bilirubin Total: 0.7 mg/dL (ref 0.0–1.2)
CO2: 19 mmol/L — ABNORMAL LOW (ref 20–29)
Calcium: 9.7 mg/dL (ref 8.7–10.2)
Chloride: 101 mmol/L (ref 96–106)
Creatinine, Ser: 0.96 mg/dL (ref 0.76–1.27)
GFR calc Af Amer: 106 mL/min/{1.73_m2} (ref >59)
GFR calc non Af Amer: 92 mL/min/{1.73_m2} (ref >59)
Globulin, Total: 2.5 g/dL (ref 1.5–4.5)
Glucose: 159 mg/dL — ABNORMAL HIGH (ref 65–99)
Potassium: 4.7 mmol/L (ref 3.5–5.2)
Sodium: 138 mmol/L (ref 134–144)
Total Protein: 7 g/dL (ref 6.0–8.5)

## 2019-05-27 LAB — CBC WITH DIFFERENTIAL/PLATELET
Basophils Absolute: 0 10*3/uL (ref 0.0–0.2)
Basos: 0 %
EOS (ABSOLUTE): 0.5 10*3/uL — ABNORMAL HIGH (ref 0.0–0.4)
Eos: 6 %
Hematocrit: 50.9 % (ref 37.5–51.0)
Hemoglobin: 16.9 g/dL (ref 13.0–17.7)
Immature Grans (Abs): 0 10*3/uL (ref 0.0–0.1)
Immature Granulocytes: 0 %
Lymphocytes Absolute: 2.3 10*3/uL (ref 0.7–3.1)
Lymphs: 27 %
MCH: 29.1 pg (ref 26.6–33.0)
MCHC: 33.2 g/dL (ref 31.5–35.7)
MCV: 88 fL (ref 79–97)
Monocytes Absolute: 0.6 10*3/uL (ref 0.1–0.9)
Monocytes: 7 %
Neutrophils Absolute: 5 10*3/uL (ref 1.4–7.0)
Neutrophils: 60 %
Platelets: 301 10*3/uL (ref 150–450)
RBC: 5.8 x10E6/uL (ref 4.14–5.80)
RDW: 12.9 % (ref 11.6–15.4)
WBC: 8.5 10*3/uL (ref 3.4–10.8)

## 2019-05-27 LAB — LIPID PANEL W/O CHOL/HDL RATIO
Cholesterol, Total: 208 mg/dL — ABNORMAL HIGH (ref 100–199)
HDL: 43 mg/dL (ref >39)
LDL Chol Calc (NIH): 119 mg/dL — ABNORMAL HIGH (ref 0–99)
Triglycerides: 265 mg/dL — ABNORMAL HIGH (ref 0–149)
VLDL Cholesterol Cal: 46 mg/dL — ABNORMAL HIGH (ref 5–40)

## 2019-05-27 LAB — TSH: TSH: 1.53 u[IU]/mL (ref 0.450–4.500)

## 2019-06-12 ENCOUNTER — Encounter: Payer: Self-pay | Admitting: Family Medicine

## 2019-06-21 ENCOUNTER — Other Ambulatory Visit: Payer: Self-pay | Admitting: Family Medicine

## 2019-06-21 NOTE — Telephone Encounter (Signed)
Pharmacy requesting to change steglarto to jardiance. Please advise and send if appropriate.

## 2019-06-21 NOTE — Telephone Encounter (Signed)
Notes to clinic:  Alternative Requested:NOT ON FORMULARY PLEASE ADVISE.   Requested Prescriptions  Pending Prescriptions Disp Refills   JARDIANCE 25 MG TABS tablet [Pharmacy Med Name: JARDIANCE 25 MG TABLET]  0    Sig: Please specify directions, refills and quantity      Endocrinology:  Diabetes - SGLT2 Inhibitors Failed - 06/21/2019  9:51 AM      Failed - LDL in normal range and within 360 days    LDL Chol Calc (NIH)  Date Value Ref Range Status  05/26/2019 119 (H) 0 - 99 mg/dL Final          Failed - HBA1C is between 0 and 7.9 and within 180 days    Hemoglobin A1C  Date Value Ref Range Status  04/16/2016 8.4  Final   HB A1C (BAYER DCA - WAIVED)  Date Value Ref Range Status  05/26/2019 8.2 (H) <7.0 % Final    Comment:                                          Diabetic Adult            <7.0                                       Healthy Adult        4.3 - 5.7                                                           (DCCT/NGSP) American Diabetes Association's Summary of Glycemic Recommendations for Adults with Diabetes: Hemoglobin A1c <7.0%. More stringent glycemic goals (A1c <6.0%) may further reduce complications at the cost of increased risk of hypoglycemia.           Passed - Cr in normal range and within 360 days    Creatinine, Ser  Date Value Ref Range Status  05/26/2019 0.96 0.76 - 1.27 mg/dL Final          Passed - eGFR in normal range and within 360 days    GFR calc Af Amer  Date Value Ref Range Status  05/26/2019 106 >59 mL/min/1.73 Final   GFR calc non Af Amer  Date Value Ref Range Status  05/26/2019 92 >59 mL/min/1.73 Final          Passed - Valid encounter within last 6 months    Recent Outpatient Visits           3 weeks ago [place], Megan P, DO   4 months ago Type 2 diabetes mellitus without complication, without long-term current use of insulin ([place])   [place], Megan P, DO   7  months ago Type 2 diabetes mellitus without complication, without long-term current use of insulin ([place])   [place], Megan P, DO   10 months ago Type 2 diabetes mellitus without complication, without long-term current use of insulin ([place])   [place], Megan P, DO   11 months ago Contact dermatitis, unspecified contact dermatitis type, unspecified trigger   [place]  Valerie Roys, DO       Future Appointments             In 2 months Wynetta Emery, Barb Merino, DO [facility], [facility]

## 2019-06-23 NOTE — Telephone Encounter (Signed)
I'm not sure what this means- can we check with the pharmacy

## 2019-06-23 NOTE — Telephone Encounter (Signed)
Called patient to discuss medication, left a message, will wait for a return call.

## 2019-06-23 NOTE — Telephone Encounter (Signed)
Routing to provider. See medication change request.

## 2019-06-23 NOTE — Telephone Encounter (Signed)
Patient returning [place] call and requesting a callback

## 2019-06-23 NOTE — Telephone Encounter (Signed)
Awaiting call back on meds to see if he had bad reaction to jardiance

## 2019-06-26 ENCOUNTER — Other Ambulatory Visit: Payer: Self-pay | Admitting: Family Medicine

## 2019-06-26 MED ORDER — PREDNISONE 10 MG PO TABS: ORAL_TABLET | ORAL | 0 refills | Status: DC

## 2019-06-26 NOTE — Telephone Encounter (Signed)
Refill for ALPRAZOLAM 0.5 MG TABLET.

## 2019-06-26 NOTE — Telephone Encounter (Signed)
Called patient. LVM for patient to return call to the office

## 2019-06-27 NOTE — Telephone Encounter (Signed)
Called and left a message for patient to return my call.  

## 2019-06-27 NOTE — Telephone Encounter (Signed)
Routing to provider  

## 2019-06-28 ENCOUNTER — Ambulatory Visit: Payer: Self-pay | Admitting: Pharmacist

## 2019-06-28 NOTE — Chronic Care Management (AMB) (Signed)
  Chronic Care Management   Note  06/28/2019 Name: Glenn Flynn MRN: [id number] DOB: 1971-02-12  [name] is a 49 y.o. year old male who is a primary care patient of Valerie Roys, DO. The CCM team was consulted for assistance with chronic disease management and care coordination needs.    Reviewing open pharmacy cases. Patient w/ NSTEMI in December and uncontrolled A1c. Will collaborate w/ Care Guide to outreach and see if patient interested in CCM services with our team.   Catie Darnelle Maffucci, PharmD, [place] [phone]

## 2019-06-28 NOTE — Telephone Encounter (Signed)
Called and spoke to patient. He states that the Vania Rea is too expensive even after insurance. States it is still over $200. Patient states that he is going to try to renew the coupon for the steglarto and see if that works. Will let us know if he needs anything from [place]. Routing back to provider to refuse.

## 2019-06-29 ENCOUNTER — Telehealth: Payer: Self-pay | Admitting: Family Medicine

## 2019-06-29 NOTE — Chronic Care Management (AMB) (Signed)
  Care Management   Note  06/29/2019 Name: Glenn Flynn MRN: [id number] DOB: 11/19/1970  Glenn Flynn is a 49 y.o. year old male who is a primary care patient of Valerie Roys, DO and is actively engaged with the care management team. I reached out to Glenn Flynn by phone today to assist with scheduling a follow up visit with the Pharmacist  Follow up plan: Patient declines further follow up and engagement by the care management team. Appropriate care team members and provider have been notified via electronic communication.   Noreene Larsson, [place], [place], [place] [address] Direct Dial: [phone] [email or web address] Website: [email or web address]

## 2019-08-09 ENCOUNTER — Other Ambulatory Visit: Payer: Self-pay | Admitting: Family Medicine

## 2019-08-09 NOTE — Telephone Encounter (Signed)
Requested medication (s) are due for refill today: yes  Requested medication (s) are on the active medication list: yes  Last refill: 06/26/2019  Future visit scheduled: yes  Notes to clinic:  medication was ordered  by different provider Review for refill   Requested Prescriptions  Pending Prescriptions Disp Refills   metFORMIN (GLUCOPHAGE-XR) 500 MG 24 hr tablet [Pharmacy Med Name: METFORMIN ER TAB 500MG GP] 360 tablet 0    Sig: TAKE 2 TABLETS TWO TIMES A DAY      Endocrinology:  Diabetes - Biguanides Failed - 08/09/2019  3:15 PM      Failed - HBA1C is between 0 and 7.9 and within 180 days    Hemoglobin A1C  Date Value Ref Range Status  04/16/2016 8.4  Final   HB A1C (BAYER DCA - WAIVED)  Date Value Ref Range Status  05/26/2019 8.2 (H) <7.0 % Final    Comment:                                          Diabetic Adult            <7.0                                       Healthy Adult        4.3 - 5.7                                                           (DCCT/NGSP) American Diabetes Association's Summary of Glycemic Recommendations for Adults with Diabetes: Hemoglobin A1c <7.0%. More stringent glycemic goals (A1c <6.0%) may further reduce complications at the cost of increased risk of hypoglycemia.           Passed - Cr in normal range and within 360 days    Creatinine, Ser  Date Value Ref Range Status  05/26/2019 0.96 0.76 - 1.27 mg/dL Final          Passed - eGFR in normal range and within 360 days    GFR calc Af Amer  Date Value Ref Range Status  05/26/2019 106 >59 mL/min/1.73 Final   GFR calc non Af Amer  Date Value Ref Range Status  05/26/2019 92 >59 mL/min/1.73 Final          Passed - Valid encounter within last 6 months    Recent Outpatient Visits           2 months ago [place], Megan P, DO   5 months ago Type 2 diabetes mellitus without complication, without long-term current use of insulin ([place])   [place], Megan P, DO   8 months ago Type 2 diabetes mellitus without complication, without long-term current use of insulin ([place])   [place], Megan P, DO   11 months ago Type 2 diabetes mellitus without complication, without long-term current use of insulin ([place])   [place], Megan P, DO   1 year ago Contact dermatitis, unspecified contact dermatitis type, unspecified trigger   [employer], Megan P, DO  Future Appointments             In 2 weeks Wynetta Emery, Barb Merino, DO [facility]

## 2019-08-09 NOTE — Telephone Encounter (Signed)
Requested medication (s) are due for refill today - yes  Requested medication (s) are on the active medication list -yes  Future visit scheduled -yes  Last refill: 06/27/19  Notes to clinic: request for non delegated Rx  Requested Prescriptions  Pending Prescriptions Disp Refills   ALPRAZolam (XANAX) 0.5 MG tablet [Pharmacy Med Name: ALPRAZOLAM 0.5 MG TABLET] 30 tablet 1    Sig: TAKE 1 TABLET (0.5 MG TOTAL) BY MOUTH 2 (TWO) TIMES DAILY AS NEEDED FOR ANXIETY.      Not Delegated - Psychiatry:  Anxiolytics/Hypnotics Failed - 08/09/2019  2:48 PM      Failed - This refill cannot be delegated      Failed - Urine Drug Screen completed in last 360 days.      Passed - Valid encounter within last 6 months    Recent Outpatient Visits           2 months ago [place], Megan P, DO   5 months ago Type 2 diabetes mellitus without complication, without long-term current use of insulin ([place])   [place], Megan P, DO   8 months ago Type 2 diabetes mellitus without complication, without long-term current use of insulin ([place])   [place], Megan P, DO   11 months ago Type 2 diabetes mellitus without complication, without long-term current use of insulin ([place])   [place], Megan P, DO   1 year ago Contact dermatitis, unspecified contact dermatitis type, unspecified trigger   [place], Megan P, DO       Future Appointments             In 2 weeks Wynetta Emery, Barb Merino, DO [facility]                Requested Prescriptions  Pending Prescriptions Disp Refills   ALPRAZolam (XANAX) 0.5 MG tablet [Pharmacy Med Name: ALPRAZOLAM 0.5 MG TABLET] 30 tablet 1    Sig: TAKE 1 TABLET (0.5 MG TOTAL) BY MOUTH 2 (TWO) TIMES DAILY AS NEEDED FOR ANXIETY.      Not Delegated - Psychiatry:  Anxiolytics/Hypnotics Failed - 08/09/2019  2:48 PM      Failed - This refill cannot  be delegated      Failed - Urine Drug Screen completed in last 360 days.      Passed - Valid encounter within last 6 months    Recent Outpatient Visits           2 months ago [place], Megan P, DO   5 months ago Type 2 diabetes mellitus without complication, without long-term current use of insulin ([place])   [place], Megan P, DO   8 months ago Type 2 diabetes mellitus without complication, without long-term current use of insulin ([place])   [place], Megan P, DO   11 months ago Type 2 diabetes mellitus without complication, without long-term current use of insulin ([place])   [place], Megan P, DO   1 year ago Contact dermatitis, unspecified contact dermatitis type, unspecified trigger   [place], Barb Merino, DO       Future Appointments             In 2 weeks Wynetta Emery, Barb Merino, DO [facility], [facility]

## 2019-08-09 NOTE — Telephone Encounter (Signed)
LOV: 05/26/2019, NOV: 08/24/2019

## 2019-08-09 NOTE — Telephone Encounter (Signed)
LOV: 05/26/19, NOV: 08/24/19

## 2019-08-10 NOTE — Telephone Encounter (Signed)
Please find out if he has enough to get to his appointment in 2 weeks.

## 2019-08-10 NOTE — Telephone Encounter (Signed)
Called and LVM asking for patient to please return my call.  

## 2019-08-11 NOTE — Telephone Encounter (Signed)
Called and LVM asking for patient to please return my call.  

## 2019-08-14 NOTE — Telephone Encounter (Signed)
Please deny. 

## 2019-08-14 NOTE — Telephone Encounter (Signed)
Appt scheduled

## 2019-08-18 ENCOUNTER — Telehealth: Payer: Self-pay

## 2019-08-18 ENCOUNTER — Encounter: Payer: Self-pay | Admitting: Family Medicine

## 2019-08-18 ENCOUNTER — Ambulatory Visit: Payer: Self-pay | Admitting: Pharmacist

## 2019-08-18 ENCOUNTER — Ambulatory Visit (INDEPENDENT_AMBULATORY_CARE_PROVIDER_SITE_OTHER): Payer: BC Managed Care – PPO | Admitting: Family Medicine

## 2019-08-18 ENCOUNTER — Other Ambulatory Visit: Payer: Self-pay

## 2019-08-18 VITALS — BP 122/72 | HR 85 | Temp 98.7°F | Ht 70.47 in | Wt 236.0 lb

## 2019-08-18 DIAGNOSIS — Z125 Encounter for screening for malignant neoplasm of prostate: Secondary | ICD-10-CM | POA: Diagnosis not present

## 2019-08-18 DIAGNOSIS — E1165 Type 2 diabetes mellitus with hyperglycemia: Secondary | ICD-10-CM | POA: Diagnosis not present

## 2019-08-18 DIAGNOSIS — F419 Anxiety disorder, unspecified: Secondary | ICD-10-CM

## 2019-08-18 DIAGNOSIS — IMO0002 Reserved for concepts with insufficient information to code with codable children: Secondary | ICD-10-CM

## 2019-08-18 DIAGNOSIS — E118 Type 2 diabetes mellitus with unspecified complications: Secondary | ICD-10-CM

## 2019-08-18 DIAGNOSIS — E782 Mixed hyperlipidemia: Secondary | ICD-10-CM

## 2019-08-18 DIAGNOSIS — Z0000 Encounter for general adult medical examination without abnormal findings: Secondary | ICD-10-CM | POA: Diagnosis not present

## 2019-08-18 DIAGNOSIS — G4700 Insomnia, unspecified: Secondary | ICD-10-CM | POA: Diagnosis not present

## 2019-08-18 LAB — MICROALBUMIN, URINE WAIVED
Creatinine, Urine Waived: 200 mg/dL (ref 10–300)
Microalb, Ur Waived: 30 mg/L — ABNORMAL HIGH (ref 0–19)
Microalb/Creat Ratio: <30 mg/g (ref <30)

## 2019-08-18 LAB — UA/M W/RFLX CULTURE, ROUTINE
Bilirubin, UA: NEGATIVE
Glucose, UA: NEGATIVE
Leukocytes,UA: NEGATIVE
Nitrite, UA: NEGATIVE
Protein,UA: NEGATIVE
RBC, UA: NEGATIVE
Specific Gravity, UA: >1.03 — ABNORMAL HIGH (ref 1.005–1.030)
Urobilinogen, Ur: 0.2 mg/dL (ref 0.2–1.0)
pH, UA: 5 (ref 5.0–7.5)

## 2019-08-18 LAB — BAYER DCA HB A1C WAIVED: HB A1C (BAYER DCA - WAIVED): 7.8 % — ABNORMAL HIGH

## 2019-08-18 MED ORDER — ATORVASTATIN CALCIUM 80 MG PO TABS: 80.0000 mg | ORAL_TABLET | Freq: Every day | ORAL | 1 refills | Status: DC

## 2019-08-18 MED ORDER — ALPRAZOLAM 0.5 MG PO TABS: 0.5000 mg | ORAL_TABLET | Freq: Two times a day (BID) | ORAL | 1 refills | Status: DC | PRN

## 2019-08-18 MED ORDER — METFORMIN HCL ER 500 MG PO TB24: ORAL_TABLET | ORAL | 1 refills | Status: DC

## 2019-08-18 MED ORDER — ESCITALOPRAM OXALATE 20 MG PO TABS: 20.0000 mg | ORAL_TABLET | Freq: Every day | ORAL | 1 refills | Status: DC

## 2019-08-18 MED ORDER — BUPROPION HCL ER (XL) 150 MG PO TB24: 150.0000 mg | ORAL_TABLET | Freq: Every day | ORAL | 1 refills | Status: DC

## 2019-08-18 MED ORDER — TRAZODONE HCL 100 MG PO TABS: 100.0000 mg | ORAL_TABLET | Freq: Every day | ORAL | 1 refills | Status: DC

## 2019-08-18 NOTE — Progress Notes (Signed)
BP 122/72 (BP Location: Left Arm, Patient Position: Sitting, Cuff Size: Large)   Pulse 85   Temp 98.7 F (37.1 C) (Oral)   Ht 5' 10.47" (1.79 m)   Wt 236 lb (107 kg)   SpO2 98%   BMI 33.41 kg/m    Subjective:    Patient ID: Glenn Flynn, male    DOB: 07/27/1970, 49 y.o.   MRN: [id number]  HPI: Glenn Flynn is a 49 y.o. male presenting on 08/18/2019 for comprehensive medical examination. Current medical complaints include:  DIABETES- stopped taking his seglatro due to cost Hypoglycemic episodes:no Polydipsia/polyuria: no Visual disturbance: no Chest pain: no Paresthesias: no Glucose Monitoring: yes  Accucheck frequency: Not Checking Taking Insulin?: no Blood Pressure Monitoring: not checking Retinal Examination: Up to Date Foot Exam: Up to Date Diabetic Education: Completed Pneumovax: Up to Date Influenza: Up to Date Aspirin: yes  ANXIETY/STRESS- just left his job to change careers, 30 pills last him about 6 months, trazodone is helping with sleep Duration:stable Anxious mood: yes  Excessive worrying: yes Irritability: no  Sweating: no Nausea: no Palpitations:no Hyperventilation: no Panic attacks: no Agoraphobia: no  Obscessions/compulsions: no Depressed mood: yes Depression screen [facility] 2/9 08/18/2019 05/26/2019 02/21/2019 08/15/2018 02/09/2018  Decreased Interest 0 0 0 0 1  Down, Depressed, Hopeless 1 1 0 0 1  PHQ - 2 Score 1 1 0 0 2  Altered sleeping 0 0 3 3 2   Tired, decreased energy 1 1 1 3 1   Change in appetite 0 0 0 0 0  Feeling bad or failure about yourself  0 0 0 0 0  Trouble concentrating 0 0 0 0 1  Moving slowly or fidgety/restless 0 0 0 0 0  Suicidal thoughts 0 0 0 0 0  PHQ-9 Score 2 2 4 6 6   Difficult doing work/chores Not difficult at all Not difficult at all Not difficult at all Not difficult at all Somewhat difficult   Anhedonia: no Weight changes: no Insomnia: no   Hypersomnia: no Fatigue/loss of energy: no Feelings of  worthlessness: no Feelings of guilt: no Impaired concentration/indecisiveness: no Suicidal ideations: no  Crying spells: no Recent Stressors/Life Changes: yes   Relationship problems: no   Family stress: no     Financial stress: yes    Job stress: yes    Recent death/loss: no  HYPERLIPIDEMIA Hyperlipidemia status: excellent compliance Satisfied with current treatment?  yes Side effects:  no Medication compliance: excellent compliance Past cholesterol meds: atorvastatin Supplements: none Aspirin:  yes The ASCVD Risk score [name], et al., 2013) failed to calculate for the following reasons:   The patient has a prior MI or stroke diagnosis Chest pain:  no Coronary artery disease:  no  Interim Problems from his last visit: no  Depression Screen done today and results listed below:  Depression screen [facility] 2/9 08/18/2019 05/26/2019 02/21/2019 08/15/2018 02/09/2018  Decreased Interest 0 0 0 0 1  Down, Depressed, Hopeless 1 1 0 0 1  PHQ - 2 Score 1 1 0 0 2  Altered sleeping 0 0 3 3 2   Tired, decreased energy 1 1 1 3 1   Change in appetite 0 0 0 0 0  Feeling bad or failure about yourself  0 0 0 0 0  Trouble concentrating 0 0 0 0 1  Moving slowly or fidgety/restless 0 0 0 0 0  Suicidal thoughts 0 0 0 0 0  PHQ-9 Score 2 2 4 6 6   Difficult doing work/chores  Not difficult at all Not difficult at all Not difficult at all Not difficult at all Somewhat difficult    Past Medical History:  Past Medical History:  Diagnosis Date  . Agoraphobia   . Allergy   . Anxiety   . Depression   . Diabetes mellitus without complication ([place])   . Hyperlipidemia   . Migraines     Surgical History:  Past Surgical History:  Procedure Laterality Date  . LEFT HEART CATH AND CORONARY ANGIOGRAPHY N/A 02/21/2018   Procedure: LEFT HEART CATH AND CORONARY ANGIOGRAPHY;  Surgeon: Corey Skains, MD;  Location: [place] [facility];  Service: Cardiovascular;  Laterality: N/A;  . LEFT HEART CATH AND  CORONARY ANGIOGRAPHY N/A 03/27/2019   Procedure: LEFT HEART CATH AND CORONARY ANGIOGRAPHY and possible PCI and stent;  Surgeon: Yolonda Kida, MD;  Location: [place] [facility];  Service: Cardiovascular;  Laterality: N/A;  . TONSILLECTOMY  Age 49    Medications:  Current Outpatient Medications on File Prior to Visit  Medication Sig  . Multiple Vitamins-Minerals (MULTIVITAMIN WITH MINERALS) tablet Take 1 tablet by mouth daily.  Marland Kitchen aspirin EC 81 MG EC tablet Take 1 tablet (81 mg total) by mouth daily. (Patient not taking: Reported on 08/18/2019)  . clopidogrel (PLAVIX) 75 MG tablet Take 1 tablet (75 mg total) by mouth daily. (Patient not taking: Reported on 08/18/2019)  . Ertugliflozin L-PyroglutamicAc (STEGLATRO) 15 MG TABS Take 15 mg by mouth daily before breakfast. (Patient not taking: Reported on 08/18/2019)  . triamcinolone ointment (KENALOG) 0.5 % Apply 1 application topically 2 (two) times daily. (Patient not taking: Reported on 08/18/2019)   No current facility-administered medications on file prior to visit.    Allergies:  No Known Allergies  Social History:  Social History   Socioeconomic History  . Marital status: Single    Spouse name: Not on file  . Number of children: Not on file  . Years of education: Not on file  . Highest education level: Not on file  Occupational History  . Not on file  Tobacco Use  . Smoking status: Never Smoker  . Smokeless tobacco: Never Used  Substance and Sexual Activity  . Alcohol use: No    Alcohol/week: 0.0 standard drinks    Comment: rare  . Drug use: No  . Sexual activity: Yes  Other Topics Concern  . Not on file  Social History Narrative  . Not on file   Social Determinants of Health   Financial Resource Strain:   . Difficulty of Paying Living Expenses:   Food Insecurity:   . Worried About Charity fundraiser in the Last Year:   . Arboriculturist in the Last Year:   Transportation Needs:   . Film/video editor  (Medical):   Marland Kitchen Lack of Transportation (Non-Medical):   Physical Activity:   . Days of Exercise per Week:   . Minutes of Exercise per Session:   Stress:   . Feeling of Stress :   Social Connections:   . Frequency of Communication with Friends and Family:   . Frequency of Social Gatherings with Friends and Family:   . Attends Religious Services:   . Active Member of Clubs or Organizations:   . Attends Archivist Meetings:   Marland Kitchen Marital Status:   Intimate Partner Violence:   . Fear of Current or Ex-Partner:   . Emotionally Abused:   Marland Kitchen Physically Abused:   . Sexually Abused:    Social  History   Tobacco Use  Smoking Status Never Smoker  Smokeless Tobacco Never Used   Social History   Substance and Sexual Activity  Alcohol Use No  . Alcohol/week: 0.0 standard drinks   Comment: rare    Family History:  Family History  Problem Relation Age of Onset  . Diabetes Mother   . Hypertension Mother   . Diabetes Father   . Hypertension Father   . Heart disease Maternal Grandmother   . Heart disease Maternal Grandfather   . Stroke Paternal Grandfather   . Cancer Neg Hx   . COPD Neg Hx     Past medical history, surgical history, medications, allergies, family history and social history reviewed with patient today and changes made to appropriate areas of the chart.   Review of Systems  Constitutional: Negative.   HENT: Negative.   Eyes: Negative.   Respiratory: Negative.   Cardiovascular: Negative.   Gastrointestinal: Negative.   Genitourinary: Negative.   Musculoskeletal: Negative.   Skin: Positive for rash. Negative for itching.  Neurological: Negative.   Endo/Heme/Allergies: Positive for environmental allergies. Negative for polydipsia. Does not bruise/bleed easily.  Psychiatric/Behavioral: Negative for depression, hallucinations, memory loss, substance abuse and suicidal ideas. The patient is nervous/anxious. The patient does not have insomnia.     All other  ROS negative except what is listed above and in the HPI.      Objective:    BP 122/72 (BP Location: Left Arm, Patient Position: Sitting, Cuff Size: Large)   Pulse 85   Temp 98.7 F (37.1 C) (Oral)   Ht 5' 10.47" (1.79 m)   Wt 236 lb (107 kg)   SpO2 98%   BMI 33.41 kg/m   Wt Readings from Last 3 Encounters:  08/18/19 236 lb (107 kg)  05/26/19 233 lb 9.6 oz (106 kg)  03/27/19 244 lb (110.7 kg)    Physical Exam Vitals and nursing note reviewed.  Constitutional:      General: He is not in acute distress.    Appearance: Normal appearance. He is obese. He is not ill-appearing, toxic-appearing or diaphoretic.  HENT:     Head: Normocephalic and atraumatic.     Right Ear: Tympanic membrane, ear canal and external ear normal. There is no impacted cerumen.     Left Ear: Tympanic membrane, ear canal and external ear normal. There is no impacted cerumen.     Nose: Nose normal. No congestion or rhinorrhea.     Mouth/Throat:     Mouth: Mucous membranes are moist.     Pharynx: Oropharynx is clear. No oropharyngeal exudate or posterior oropharyngeal erythema.  Eyes:     General: No scleral icterus.       Right eye: No discharge.        Left eye: No discharge.     Extraocular Movements: Extraocular movements intact.     Conjunctiva/sclera: Conjunctivae normal.     Pupils: Pupils are equal, round, and reactive to light.  Neck:     Vascular: No carotid bruit.  Cardiovascular:     Rate and Rhythm: Normal rate and regular rhythm.     Pulses: Normal pulses.     Heart sounds: No murmur. No friction rub. No gallop.   Pulmonary:     Effort: Pulmonary effort is normal. No respiratory distress.     Breath sounds: Normal breath sounds. No stridor. No wheezing, rhonchi or rales.  Chest:     Chest wall: No tenderness.  Abdominal:     General:  Abdomen is flat. Bowel sounds are normal. There is no distension.     Palpations: Abdomen is soft. There is no mass.     Tenderness: There is no abdominal  tenderness. There is no right CVA tenderness, left CVA tenderness, guarding or rebound.     Hernia: No hernia is present.  Genitourinary:    Comments: Genital exam deferred with shared decision making Musculoskeletal:        General: No swelling, tenderness, deformity or signs of injury.     Cervical back: Normal range of motion and neck supple. No rigidity. No muscular tenderness.     Right lower leg: No edema.     Left lower leg: No edema.  Lymphadenopathy:     Cervical: No cervical adenopathy.  Skin:    General: Skin is warm and dry.     Capillary Refill: Capillary refill takes less than 2 seconds.     Coloration: Skin is not jaundiced or pale.     Findings: No bruising, erythema, lesion or rash.  Neurological:     General: No focal deficit present.     Mental Status: He is alert and oriented to person, place, and time.     Cranial Nerves: No cranial nerve deficit.     Sensory: No sensory deficit.     Motor: No weakness.     Coordination: Coordination normal.     Gait: Gait normal.     Deep Tendon Reflexes: Reflexes normal.  Psychiatric:        Mood and Affect: Mood normal.        Behavior: Behavior normal.        Thought Content: Thought content normal.        Judgment: Judgment normal.     Results for orders placed or performed in visit on 05/26/19  Microscopic Examination   URINE  Result Value Ref Range   RBC 0-2 0 - 2 /hpf   Epithelial Cells (non renal) 0-10 0 - 10 /hpf  UA/M w/rflx Culture, Routine   Specimen: Urine   URINE  Result Value Ref Range   Specific Gravity, UA 1.020 1.005 - 1.030   pH, UA 5.0 5.0 - 7.5   Color, UA Yellow Yellow   Appearance Ur Clear Clear   Leukocytes,UA Negative Negative   Protein,UA Negative Negative/Trace   Glucose, UA 3+ (A) Negative   Ketones, UA Trace (A) Negative   RBC, UA Trace (A) Negative   Bilirubin, UA Negative Negative   Urobilinogen, Ur 0.2 0.2 - 1.0 mg/dL   Nitrite, UA Negative Negative   Microscopic Examination  See below:   TSH  Result Value Ref Range   TSH 1.530 0.450 - 4.500 uIU/mL  Microalbumin, Urine Waived  Result Value Ref Range   Microalb, Ur Waived 10 0 - 19 mg/L   Creatinine, Urine Waived 50 10 - 300 mg/dL   Microalb/Creat Ratio <30 <30 mg/g  CBC with Differential/Platelet  Result Value Ref Range   WBC 8.5 3.4 - 10.8 x10E3/uL   RBC 5.80 4.14 - 5.80 x10E6/uL   Hemoglobin 16.9 13.0 - 17.7 g/dL   Hematocrit 50.9 37.5 - 51.0 %   MCV 88 79 - 97 fL   MCH 29.1 26.6 - 33.0 pg   MCHC 33.2 31.5 - 35.7 g/dL   RDW 12.9 11.6 - 15.4 %   Platelets 301 150 - 450 x10E3/uL   Neutrophils 60 Not Estab. %   Lymphs 27 Not Estab. %   Monocytes 7 Not Estab. %  Eos 6 Not Estab. %   Basos 0 Not Estab. %   Neutrophils Absolute 5.0 1.4 - 7.0 x10E3/uL   Lymphocytes Absolute 2.3 0.7 - 3.1 x10E3/uL   Monocytes Absolute 0.6 0.1 - 0.9 x10E3/uL   EOS (ABSOLUTE) 0.5 (H) 0.0 - 0.4 x10E3/uL   Basophils Absolute 0.0 0.0 - 0.2 x10E3/uL   Immature Granulocytes 0 Not Estab. %   Immature Grans (Abs) 0.0 0.0 - 0.1 x10E3/uL  Lipid Panel w/o Chol/HDL Ratio  Result Value Ref Range   Cholesterol, Total 208 (H) 100 - 199 mg/dL   Triglycerides 265 (H) 0 - 149 mg/dL   HDL 43 >39 mg/dL   VLDL Cholesterol Cal 46 (H) 5 - 40 mg/dL   LDL Chol Calc (NIH) 119 (H) 0 - 99 mg/dL  Comprehensive metabolic panel  Result Value Ref Range   Glucose 159 (H) 65 - 99 mg/dL   BUN 20 6 - 24 mg/dL   Creatinine, Ser 0.96 0.76 - 1.27 mg/dL   GFR calc non Af Amer 92 >59 mL/min/1.73   GFR calc Af Amer 106 >59 mL/min/1.73   BUN/Creatinine Ratio 21 (H) 9 - 20   Sodium 138 134 - 144 mmol/L   Potassium 4.7 3.5 - 5.2 mmol/L   Chloride 101 96 - 106 mmol/L   CO2 19 (L) 20 - 29 mmol/L   Calcium 9.7 8.7 - 10.2 mg/dL   Total Protein 7.0 6.0 - 8.5 g/dL   Albumin 4.5 4.0 - 5.0 g/dL   Globulin, Total 2.5 1.5 - 4.5 g/dL   Albumin/Globulin Ratio 1.8 1.2 - 2.2   Bilirubin Total 0.7 0.0 - 1.2 mg/dL   Alkaline Phosphatase 78 39 - 117 IU/L   AST 18  0 - 40 IU/L   ALT 29 0 - 44 IU/L  Bayer DCA Hb A1c Waived  Result Value Ref Range   HB A1C (BAYER DCA - WAIVED) 8.2 (H) <7.0 %      Assessment & Plan:   Problem List Items Addressed This Visit      Endocrine   Diabetes mellitus type 2 with complications, uncontrolled (HCC)    A1c improved at 7.8. Has been off his steglatro, so we expect this to go up slightly, we will see what medications are affordable for him and treat as needed. Recheck 3 months. Call with any concerns.       Relevant Medications   metFORMIN (GLUCOPHAGE-XR) 500 MG 24 hr tablet   atorvastatin (LIPITOR) 80 MG tablet   Other Relevant Orders   Bayer DCA Hb A1c Waived   CBC with Differential/Platelet   Comprehensive metabolic panel   Microalbumin, Urine Waived   TSH   UA/M w/rflx Culture, Routine   Referral to Chronic Care Management Services     Other   Hyperlipidemia    Under good control on current regimen. Continue current regimen. Continue to monitor. Call with any concerns. Refills given. Labs drawn today.       Relevant Medications   atorvastatin (LIPITOR) 80 MG tablet   Other Relevant Orders   CBC with Differential/Platelet   Comprehensive metabolic panel   Lipid Panel w/o Chol/HDL Ratio   Anxiety    Exacerbated slightly with the change in his career. Will continue current regimen. Refill of xanax given, usually lasts about 6 months. Call with any concerns.       Relevant Medications   traZODone (DESYREL) 100 MG tablet   escitalopram (LEXAPRO) 20 MG tablet   buPROPion (WELLBUTRIN XL) 150 MG  24 hr tablet   ALPRAZolam (XANAX) 0.5 MG tablet   Other Relevant Orders   CBC with Differential/Platelet   Comprehensive metabolic panel   TSH   Insomnia    Under good control on current regimen. Continue current regimen. Continue to monitor. Call with any concerns. Refills given.        Relevant Orders   CBC with Differential/Platelet   Comprehensive metabolic panel    Other Visit Diagnoses     Routine general medical examination at a health care facility    -  Primary   Vaccines up to date. Screening labs checked today. Will hold on colonoscopy until he has insurance. Continue diet and exercise. Call with any concerns.    Screening for prostate cancer       Labs checked today. Await results.    Relevant Orders   PSA   UA/M w/rflx Culture, Routine       Discussed aspirin prophylaxis for myocardial infarction prevention and decision was made to continue ASA  LABORATORY TESTING:  Health maintenance labs ordered today as discussed above.   The natural history of prostate cancer and ongoing controversy regarding screening and potential treatment outcomes of prostate cancer has been discussed with the patient. The meaning of a false positive PSA and a false negative PSA has been discussed. He indicates understanding of the limitations of this screening test and wishes to proceed with screening PSA testing.   IMMUNIZATIONS:   - Tdap: Tetanus vaccination status reviewed: last tetanus booster within 10 years. - Influenza: Postponed to flu season - Pneumovax: Up to date  SCREENING: - Colonoscopy: Will hold off due to insurance  Discussed with patient purpose of the colonoscopy is to detect colon cancer at curable precancerous or early stages   PATIENT COUNSELING:    Sexuality: Discussed sexually transmitted diseases, partner selection, use of condoms, avoidance of unintended pregnancy  and contraceptive alternatives.   Advised to avoid cigarette smoking.  I discussed with the patient that most people either abstain from alcohol or drink within safe limits (<=14/week and <=4 drinks/occasion for males, <=7/weeks and <= 3 drinks/occasion for females) and that the risk for alcohol disorders and other health effects rises proportionally with the number of drinks per week and how often a drinker exceeds daily limits.  Discussed cessation/primary prevention of drug use and availability  of treatment for abuse.   Diet: Encouraged to adjust caloric intake to maintain  or achieve ideal body weight, to reduce intake of dietary saturated fat and total fat, to limit sodium intake by avoiding high sodium foods and not adding table salt, and to maintain adequate dietary potassium and calcium preferably from fresh fruits, vegetables, and low-fat dairy products.    stressed the importance of regular exercise  Injury prevention: Discussed safety belts, safety helmets, smoke detector, smoking near bedding or upholstery.   Dental health: Discussed importance of regular tooth brushing, flossing, and dental visits.   Follow up plan: NEXT PREVENTATIVE PHYSICAL DUE IN 1 YEAR. Return in about 3 months (around 11/17/2019).

## 2019-08-18 NOTE — Assessment & Plan Note (Signed)
Under good control on current regimen. Continue current regimen. Continue to monitor. Call with any concerns. Refills given.   

## 2019-08-18 NOTE — Chronic Care Management (AMB) (Signed)
  Chronic Care Management   Note  08/18/2019 Name: Glenn Flynn MRN: [id number] DOB: [birth year]  [name] is a 49 y.o. year old male who is a primary care patient of Valerie Roys, DO. The CCM team was consulted for assistance with chronic disease management and care coordination needs.    Received referral for medication access support, as patient will be uninsured starting tomorrow. Contacted patient, left HIPAA compliant message for him to return my call at his convenience.   Attempted to contact patient for medication management review. Left HIPAA compliant message for patient to return my call at their convenience.   Plan: - Will collaborate with Care Guide to outreach to schedule follow up with me  Catie Darnelle Maffucci, PharmD, [place] [phone]

## 2019-08-18 NOTE — Assessment & Plan Note (Signed)
Exacerbated slightly with the change in his career. Will continue current regimen. Refill of xanax given, usually lasts about 6 months. Call with any concerns.

## 2019-08-18 NOTE — Assessment & Plan Note (Signed)
A1c improved at 7.8. Has been off his steglatro, so we expect this to go up slightly, we will see what medications are affordable for him and treat as needed. Recheck 3 months. Call with any concerns.

## 2019-08-18 NOTE — Assessment & Plan Note (Signed)
Under good control on current regimen. Continue current regimen. Continue to monitor. Call with any concerns. Refills given. Labs drawn today.   

## 2019-08-19 LAB — COMPREHENSIVE METABOLIC PANEL
ALT: 27 IU/L (ref 0–44)
AST: 19 IU/L (ref 0–40)
Albumin/Globulin Ratio: 2 (ref 1.2–2.2)
Albumin: 4.7 g/dL (ref 4.0–5.0)
Alkaline Phosphatase: 74 IU/L (ref 39–117)
BUN/Creatinine Ratio: 19 (ref 9–20)
BUN: 17 mg/dL (ref 6–24)
Bilirubin Total: 0.5 mg/dL (ref 0.0–1.2)
CO2: 26 mmol/L (ref 20–29)
Calcium: 10.3 mg/dL — ABNORMAL HIGH (ref 8.7–10.2)
Chloride: 100 mmol/L (ref 96–106)
Creatinine, Ser: 0.91 mg/dL (ref 0.76–1.27)
GFR calc Af Amer: 113 mL/min/{1.73_m2} (ref >59)
GFR calc non Af Amer: 98 mL/min/{1.73_m2} (ref >59)
Globulin, Total: 2.4 g/dL (ref 1.5–4.5)
Glucose: 124 mg/dL — ABNORMAL HIGH (ref 65–99)
Potassium: 5.2 mmol/L (ref 3.5–5.2)
Sodium: 139 mmol/L (ref 134–144)
Total Protein: 7.1 g/dL (ref 6.0–8.5)

## 2019-08-19 LAB — LIPID PANEL W/O CHOL/HDL RATIO
Cholesterol, Total: 210 mg/dL — ABNORMAL HIGH (ref 100–199)
HDL: 48 mg/dL (ref >39)
LDL Chol Calc (NIH): 121 mg/dL — ABNORMAL HIGH (ref 0–99)
Triglycerides: 236 mg/dL — ABNORMAL HIGH (ref 0–149)
VLDL Cholesterol Cal: 41 mg/dL — ABNORMAL HIGH (ref 5–40)

## 2019-08-19 LAB — CBC WITH DIFFERENTIAL/PLATELET
Basophils Absolute: 0.1 10*3/uL (ref 0.0–0.2)
Basos: 1 %
EOS (ABSOLUTE): 0.4 10*3/uL (ref 0.0–0.4)
Eos: 4 %
Hematocrit: 44.8 % (ref 37.5–51.0)
Hemoglobin: 14.9 g/dL (ref 13.0–17.7)
Immature Grans (Abs): 0 10*3/uL (ref 0.0–0.1)
Immature Granulocytes: 0 %
Lymphocytes Absolute: 3 10*3/uL (ref 0.7–3.1)
Lymphs: 31 %
MCH: 29.4 pg (ref 26.6–33.0)
MCHC: 33.3 g/dL (ref 31.5–35.7)
MCV: 89 fL (ref 79–97)
Monocytes Absolute: 0.7 10*3/uL (ref 0.1–0.9)
Monocytes: 8 %
Neutrophils Absolute: 5.5 10*3/uL (ref 1.4–7.0)
Neutrophils: 56 %
Platelets: 339 10*3/uL (ref 150–450)
RBC: 5.06 x10E6/uL (ref 4.14–5.80)
RDW: 13.3 % (ref 11.6–15.4)
WBC: 9.7 10*3/uL (ref 3.4–10.8)

## 2019-08-19 LAB — PSA: Prostate Specific Ag, Serum: 0.8 ng/mL (ref 0.0–4.0)

## 2019-08-19 LAB — TSH: TSH: 1.62 u[IU]/mL (ref 0.450–4.500)

## 2019-08-21 ENCOUNTER — Telehealth: Payer: Self-pay | Admitting: Family Medicine

## 2019-08-21 NOTE — Chronic Care Management (AMB) (Signed)
  Care Management   Note  08/21/2019 Name: Glenn Flynn MRN: [id number] DOB: Dec 27, 1970  Glenn Flynn is a 49 y.o. year old male who is a primary care patient of Valerie Roys, DO. Glenn Flynn is currently enrolled in care management services. An additional referral for Pharm D was placed.   Follow up plan: Unsuccessful telephone outreach attempt made. A HIPPA compliant phone message was left for the patient providing contact information and requesting a return call.  The care management team will reach out to the patient again over the next 7 days.  If patient returns call to provider office, please advise to call Embedded Care Management Care Guide Glenna Durand LPN at [phone]  Oshay Stranahan, LPN Health Advisor, [place] Management ??[email or web address] ??[phone]

## 2019-08-24 ENCOUNTER — Ambulatory Visit: Payer: BC Managed Care – PPO | Admitting: Family Medicine

## 2019-08-28 NOTE — Chronic Care Management (AMB) (Signed)
   Care Management   Note  08/28/2019 Name: Seferino Stepanski MRN: [id number] DOB: April 15, 1971  Kaylon Volker is a 49 y.o. year old male who is a primary care patient of Valerie Roys, DO. Jadrien Reynaga is currently enrolled in care management services. An additional referral for Pharm D was placed.   Follow up plan: Unsuccessful telephone outreach attempt made. A HIPPA compliant phone message was left for the patient providing contact information and requesting a return call.  The care management team will reach out to the patient again over the next 7 days.  If patient returns call to provider office, please advise to call Embedded Care Management Care Guide Glenna Durand LPN at [phone]  Idamay Hosein, LPN Health Advisor, [place] Management ??[email or web address] ??[phone]

## 2019-09-04 NOTE — Chronic Care Management (AMB) (Signed)
   Care Management   Note  09/04/2019 Name: Glenn Flynn MRN: [id number] DOB: Aug 17, 1970  Glenn Flynn is a 49 y.o. year old male who is a primary care patient of Valerie Roys, DO. Glenn Flynn is currently enrolled in care management services. An additional referral for Pharm D was placed.   Follow up plan: The care management team is available to follow up with the patient after provider conversation with the patient regarding recommendation for care management engagement and subsequent re-referral to the care management team.   Glenna Durand, LPN Health Advisor, [place] Management ??[email or web address] ??[phone]

## 2019-09-21 IMAGING — CR DG CHEST 2V
1 series · 2 of 2 positions shown · non-contrast
Comparison: November 13, 2013

CLINICAL DATA: Central chest pain.  Jaw pain.

EXAM:
CHEST - 2 VIEW

[Series 1: dg chest 2 view · 0.14mm/px · 2 of 2 slices shown]
[im 1/2]
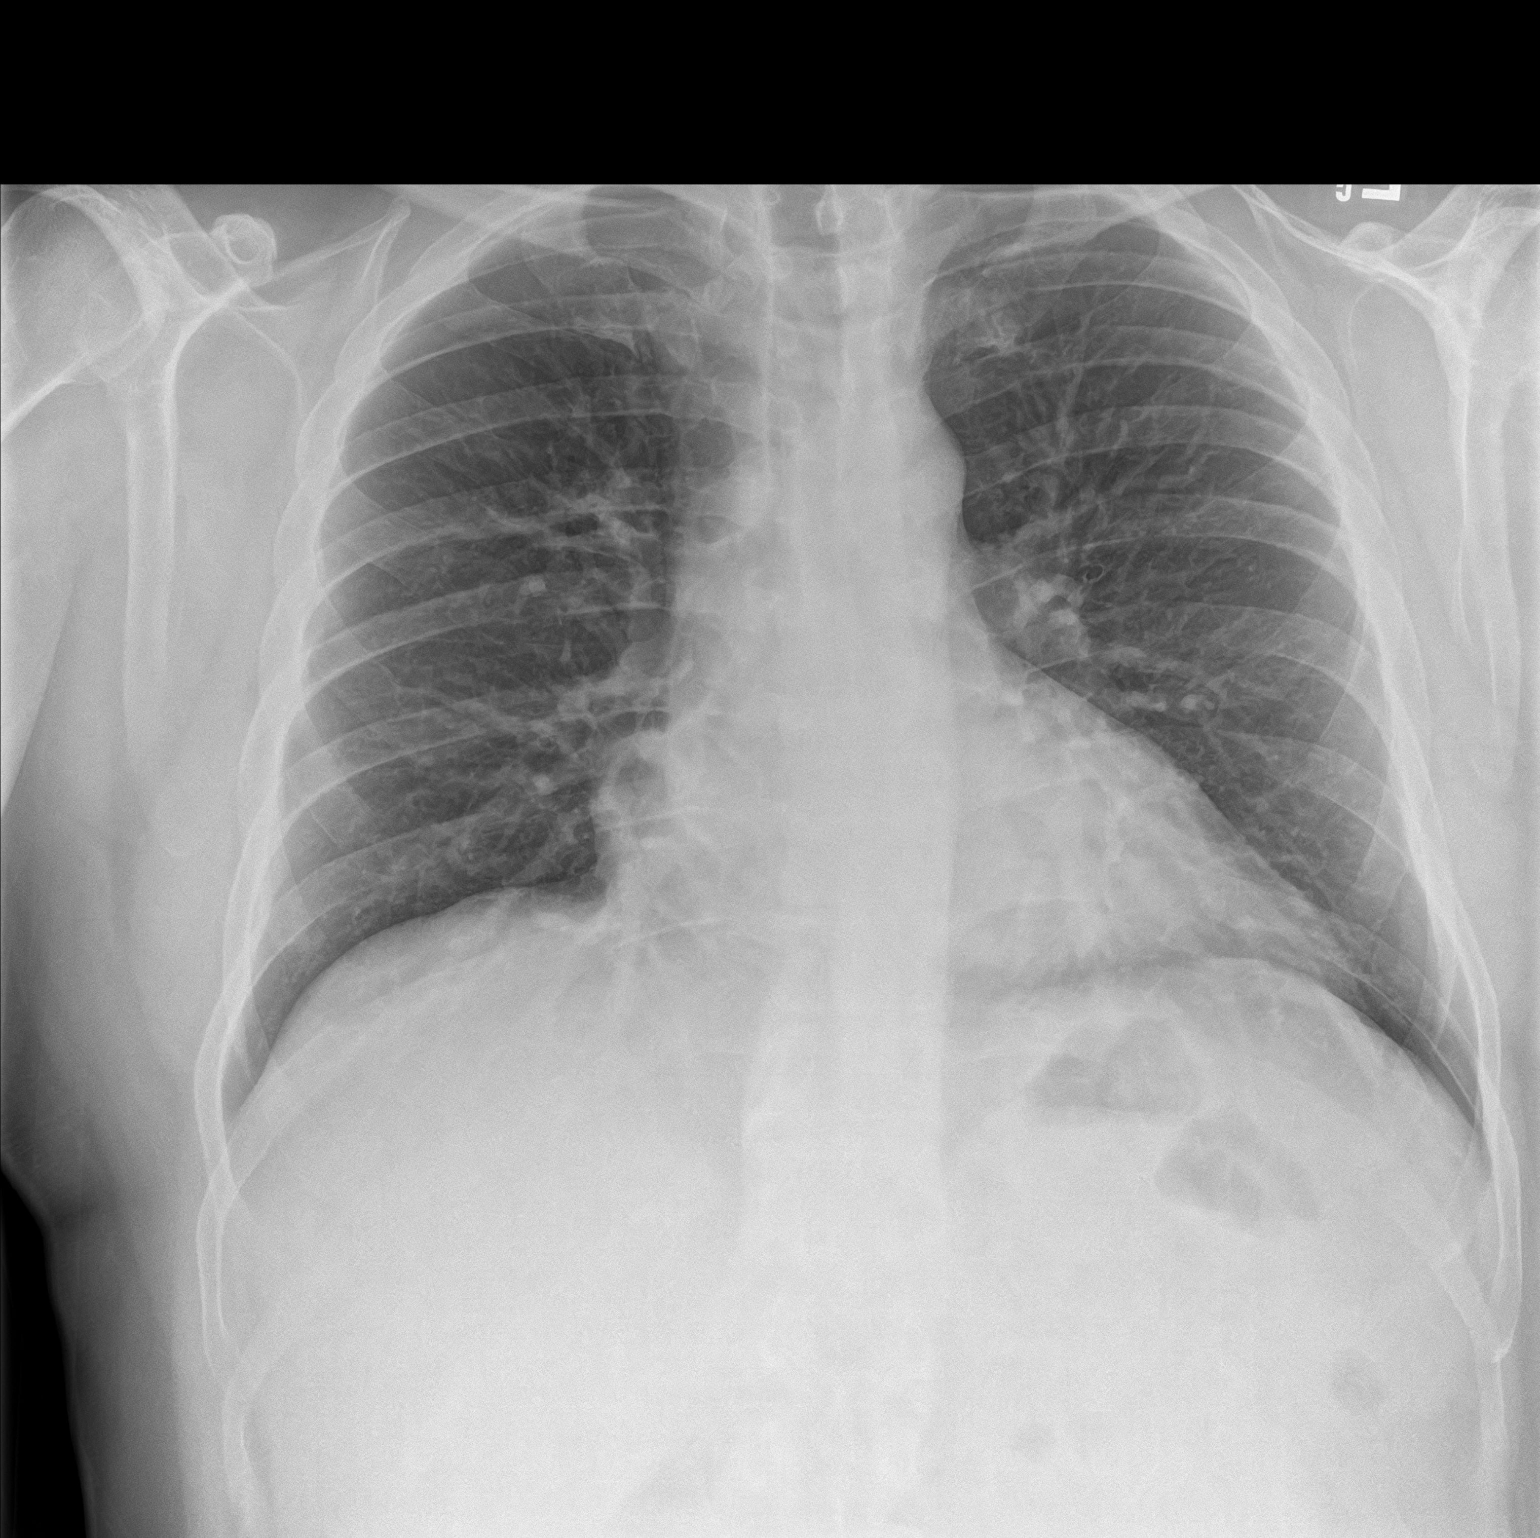
[im 2/2]
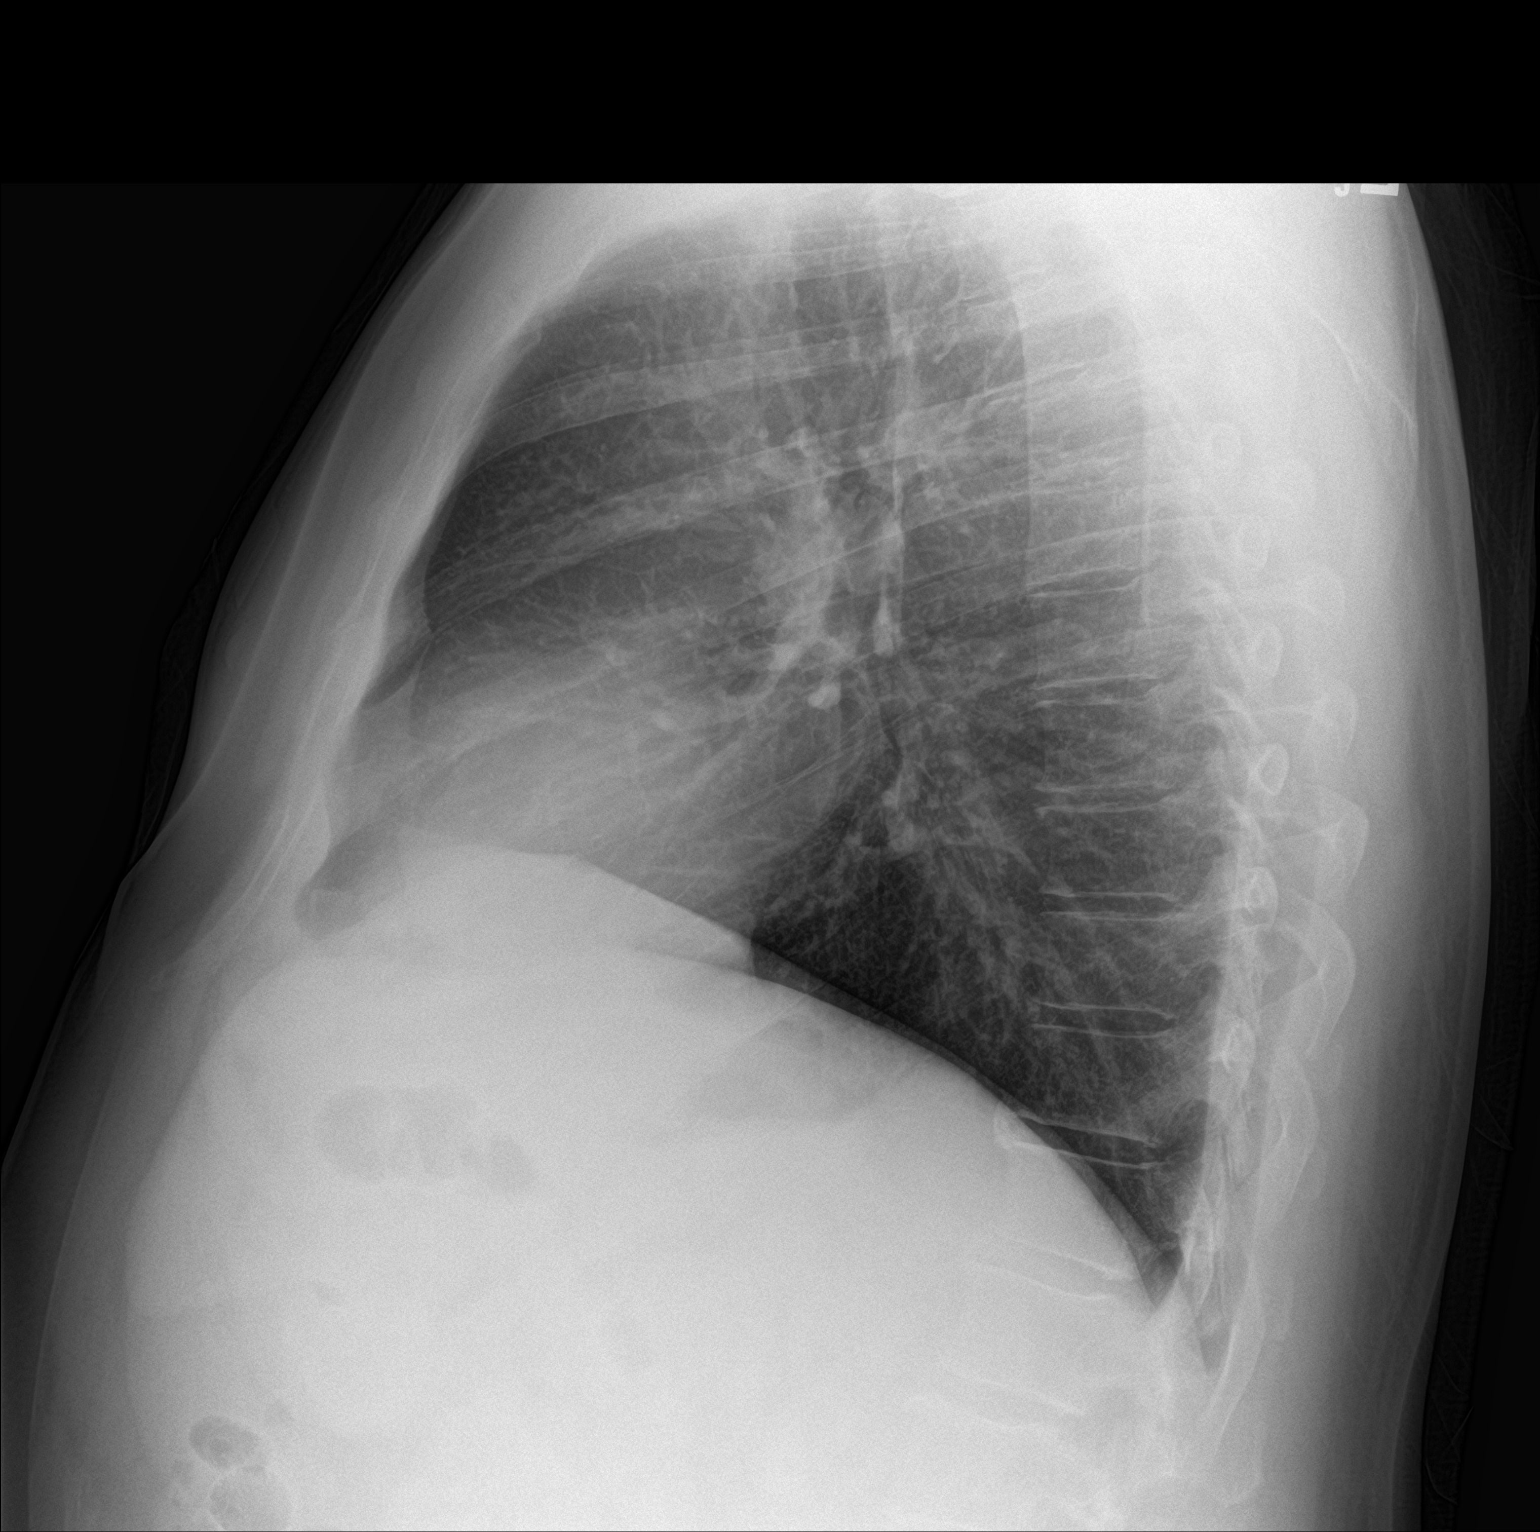

[2 of 2 positions shown; findings below may reference images not displayed]

FINDINGS: The heart size and mediastinal contours are within normal limits.
Both lungs are clear. The visualized skeletal structures are
unremarkable.
IMPRESSION: No active cardiopulmonary disease.

## 2019-09-29 ENCOUNTER — Ambulatory Visit: Payer: BC Managed Care – PPO | Admitting: Family Medicine

## 2019-10-19 ENCOUNTER — Encounter: Payer: Self-pay | Admitting: Emergency Medicine

## 2019-10-19 ENCOUNTER — Ambulatory Visit
Admission: EM | Admit: 2019-10-19 | Discharge: 2019-10-19 | Disposition: A | Discharge status: Home / Self Care | Payer: BC Managed Care – PPO | Attending: Family Medicine | Admitting: Family Medicine

## 2019-10-19 ENCOUNTER — Other Ambulatory Visit: Payer: Self-pay

## 2019-10-19 DIAGNOSIS — J0100 Acute maxillary sinusitis, unspecified: Secondary | ICD-10-CM | POA: Diagnosis not present

## 2019-10-19 MED ORDER — AMOXICILLIN-POT CLAVULANATE 875-125 MG PO TABS: 1.0000 | ORAL_TABLET | Freq: Two times a day (BID) | ORAL | 0 refills | Status: DC

## 2019-10-19 NOTE — ED Triage Notes (Signed)
Pt c/o nasal congestion, runny nose, sinus pain, cough. Started about 5 days ago. Pt states he had a sinus infection recently that went away but came back. Pt states he has been vaccinated for covid and declines the test.

## 2019-10-20 NOTE — ED Provider Notes (Signed)
[facility]    CSN: [id number] Arrival date & time: 10/19/19  1620      History   Chief Complaint Chief Complaint  Patient presents with  . Sinus Problem   HPI  49 year old male presents with the above complaint. Patient reports that he recently had a sinus infection. Took a month to resolve with OTC treatment. Now has recurrent symptoms - runny nose, congestion, sinus pain/pressure, cough. Also reports weakness & fatigue. No fever. No reported sick contacts. No relieving factors.   Past Medical History:  Diagnosis Date  . Agoraphobia   . Allergy   . Anxiety   . Depression   . Diabetes mellitus without complication ([place])   . Hyperlipidemia   . Migraines     Patient Active Problem List   Diagnosis Date Noted  . Full code status 03/25/2019  . NSTEMI (non-ST elevation myocardial infarction) ([place]) 03/24/2019  . Ischemic chest pain ([place]) 02/21/2018  . Insomnia 07/04/2015  . Controlled substance agreement signed 07/04/2015  . Fever blister 01/03/2015  . Anxiety 01/03/2015  . Hyperlipidemia 10/04/2014  . Diabetes mellitus type 2 with complications, uncontrolled ([place]) 10/04/2014    Past Surgical History:  Procedure Laterality Date  . LEFT HEART CATH AND CORONARY ANGIOGRAPHY N/A 02/21/2018   Procedure: LEFT HEART CATH AND CORONARY ANGIOGRAPHY;  Surgeon: Corey Skains, MD;  Location: [facility];  Service: Cardiovascular;  Laterality: N/A;  . LEFT HEART CATH AND CORONARY ANGIOGRAPHY N/A 03/27/2019   Procedure: LEFT HEART CATH AND CORONARY ANGIOGRAPHY and possible PCI and stent;  Surgeon: Yolonda Kida, MD;  Location: [facility];  Service: Cardiovascular;  Laterality: N/A;  . TONSILLECTOMY  Age 43       Home Medications    Prior to Admission medications   Medication Sig Start Date End Date Taking? Authorizing Provider  ALPRAZolam Duanne Moron) 0.5 MG tablet Take 1 tablet (0.5 mg total) by mouth 2 (two) times daily as needed for anxiety.  08/18/19  Yes Johnson, Megan P, DO  aspirin EC 81 MG EC tablet Take 1 tablet (81 mg total) by mouth daily. 03/27/19 03/26/20 Yes Jennye Boroughs, MD  buPROPion (WELLBUTRIN XL) 150 MG 24 hr tablet Take 1 tablet (150 mg total) by mouth daily. 08/18/19  Yes Johnson, Megan P, DO  escitalopram (LEXAPRO) 20 MG tablet Take 1 tablet (20 mg total) by mouth at bedtime. 08/18/19  Yes Johnson, Megan P, DO  metFORMIN (GLUCOPHAGE-XR) 500 MG 24 hr tablet TAKE 2 TABLETS TWO TIMES A DAY 08/18/19  Yes Johnson, Megan P, DO  Multiple Vitamins-Minerals (MULTIVITAMIN WITH MINERALS) tablet Take 1 tablet by mouth daily.   Yes [provider]  traZODone (DESYREL) 100 MG tablet Take 1 tablet (100 mg total) by mouth at bedtime. 08/18/19  Yes Johnson, Megan P, DO  amoxicillin-clavulanate (AUGMENTIN) 875-125 MG tablet Take 1 tablet by mouth 2 (two) times daily. 10/19/19   Coral Spikes, DO  Ertugliflozin L-PyroglutamicAc (STEGLATRO) 15 MG TABS Take 15 mg by mouth daily before breakfast. Patient not taking: Reported on 08/18/2019 02/22/19   Park Liter P, DO  triamcinolone ointment (KENALOG) 0.5 % Apply 1 application topically 2 (two) times daily. Patient not taking: Reported on 08/18/2019 07/18/18   Park Liter P, DO  atorvastatin (LIPITOR) 80 MG tablet Take 1 tablet (80 mg total) by mouth daily at 6 PM. 08/18/19 10/19/19  Valerie Roys, DO    Family History Family History  Problem Relation Age of Onset  .  Diabetes Mother   . Hypertension Mother   . Diabetes Father   . Hypertension Father   . Heart disease Maternal Grandmother   . Heart disease Maternal Grandfather   . Stroke Paternal Grandfather   . Cancer Neg Hx   . COPD Neg Hx     Social History Social History   Tobacco Use  . Smoking status: Never Smoker  . Smokeless tobacco: Never Used  Vaping Use  . Vaping Use: Never used  Substance Use Topics  . Alcohol use: No    Alcohol/week: 0.0 standard drinks    Comment: rare  . Drug use: No      Allergies   Patient has no known allergies.   Review of Systems Review of Systems  Constitutional: Positive for fatigue. Negative for fever.  HENT: Positive for congestion, rhinorrhea, sinus pressure and sinus pain.    Physical Exam Triage Vital Signs ED Triage Vitals  Enc Vitals Group     BP 10/19/19 1639 (!) 143/98     Pulse Rate 10/19/19 1639 76     Resp 10/19/19 1639 18     Temp 10/19/19 1639 98.5 F (36.9 C)     Temp Source 10/19/19 1639 Oral     SpO2 10/19/19 1639 97 %     Weight 10/19/19 1636 235 lb 14.3 oz (107 kg)     Height 10/19/19 1636 5\' 10"  (1.778 m)     Head Circumference --      Peak Flow --      Pain Score 10/19/19 1635 4     Pain Loc --      Pain Edu? --      Excl. in [place]? --    Updated Vital Signs BP (!) 143/98 (BP Location: Left Arm)   Pulse 76   Temp 98.5 F (36.9 C) (Oral)   Resp 18   Ht 5\' 10"  (1.778 m)   Wt 107 kg   SpO2 97%   BMI 33.85 kg/m   Visual Acuity Right Eye Distance:   Left Eye Distance:   Bilateral Distance:    Right Eye Near:   Left Eye Near:    Bilateral Near:     Physical Exam Constitutional:      General: He is not in acute distress.    Appearance: Normal appearance. He is not ill-appearing.  HENT:     Head: Normocephalic and atraumatic.     Right Ear: Tympanic membrane normal.     Left Ear: Tympanic membrane normal.     Nose:     Comments: Maxillary sinus tenderness to palpation.    Mouth/Throat:     Pharynx: Oropharynx is clear.  Eyes:     General:        Right eye: No discharge.        Left eye: No discharge.     Conjunctiva/sclera: Conjunctivae normal.  Cardiovascular:     Rate and Rhythm: Normal rate and regular rhythm.  Pulmonary:     Effort: Pulmonary effort is normal.     Breath sounds: Normal breath sounds. No wheezing, rhonchi or rales.  Neurological:     Mental Status: He is alert.     UC Treatments / Results  Labs (all labs ordered are listed, but only abnormal results are  displayed) Labs Reviewed - No data to display  EKG   Radiology No results found.  Procedures Procedures (including critical care time)  Medications Ordered in UC Medications - No data to display  Initial  Impression / Assessment and Plan / UC Course  I have reviewed the triage vital signs and the nursing notes.  Pertinent labs & imaging results that were available during my care of the patient were reviewed by me and considered in my medical decision making (see chart for details).   49 year old male presents with sinusitis. Treating with Augmentin.   Final Clinical Impressions(s) / UC Diagnoses   Final diagnoses:  Acute maxillary sinusitis, recurrence not specified   Discharge Instructions   None    ED Prescriptions    Medication Sig Dispense Auth. Provider   amoxicillin-clavulanate (AUGMENTIN) 875-125 MG tablet Take 1 tablet by mouth 2 (two) times daily. 20 tablet Coral Spikes, DO     PDMP not reviewed this encounter.   Coral Spikes, [place] 10/20/19 1549

## 2019-11-30 ENCOUNTER — Ambulatory Visit: Payer: BC Managed Care – PPO | Admitting: Family Medicine

## 2019-12-27 ENCOUNTER — Other Ambulatory Visit: Payer: Self-pay

## 2019-12-27 ENCOUNTER — Encounter: Payer: Self-pay | Admitting: Family Medicine

## 2019-12-27 ENCOUNTER — Ambulatory Visit (INDEPENDENT_AMBULATORY_CARE_PROVIDER_SITE_OTHER): Payer: BC Managed Care – PPO | Admitting: Family Medicine

## 2019-12-27 VITALS — BP 127/85 | HR 75 | Temp 98.0°F | Wt 242.0 lb

## 2019-12-27 DIAGNOSIS — Z23 Encounter for immunization: Secondary | ICD-10-CM | POA: Diagnosis not present

## 2019-12-27 DIAGNOSIS — K529 Noninfective gastroenteritis and colitis, unspecified: Secondary | ICD-10-CM | POA: Diagnosis not present

## 2019-12-27 DIAGNOSIS — E1165 Type 2 diabetes mellitus with hyperglycemia: Secondary | ICD-10-CM | POA: Diagnosis not present

## 2019-12-27 DIAGNOSIS — E118 Type 2 diabetes mellitus with unspecified complications: Secondary | ICD-10-CM | POA: Diagnosis not present

## 2019-12-27 DIAGNOSIS — IMO0002 Reserved for concepts with insufficient information to code with codable children: Secondary | ICD-10-CM

## 2019-12-27 LAB — BAYER DCA HB A1C WAIVED: HB A1C (BAYER DCA - WAIVED): 9.3 % — ABNORMAL HIGH (ref <7.0)

## 2019-12-27 MED ORDER — STEGLATRO 15 MG PO TABS
15.0000 mg | ORAL_TABLET | Freq: Every day | ORAL | 6 refills | Status: DC
Start: 2019-12-27 — End: 2020-10-10

## 2019-12-27 NOTE — Progress Notes (Signed)
BP 127/85 (BP Location: Left Arm, Patient Position: Sitting, Cuff Size: Normal)   Pulse 75   Temp 98 F (36.7 C) (Oral)   Wt 242 lb (109.8 kg)   SpO2 97%   BMI 34.72 kg/m    Subjective:    Patient ID: Glenn Flynn, male    DOB: [birth year], 49 y.o.   MRN: [id number]  HPI: Glenn Flynn is a 49 y.o. male  Chief Complaint  Patient presents with  . Diabetes  . Diarrhea   DIABETES Hypoglycemic episodes:no Polydipsia/polyuria: no Visual disturbance: no Chest pain: no Paresthesias: no Glucose Monitoring: no  Accucheck frequency: Not Checking Taking Insulin?: no Blood Pressure Monitoring: not checking Retinal Examination: Up to Date Foot Exam: Up to Date Diabetic Education: Completed Pneumovax: Up to Date Influenza: Up to Date Aspirin: no  ABDOMINAL ISSUES Duration: couple of weeks Nature: cramping and then diarrhea Frequency: throughout the day Treatments attempted: none Constipation: no Diarrhea: yes Episodes of diarrhea/day: 5-7x a day Mucous in the stool: no Heartburn: no Bloating:no Flatulence: no Nausea: yes- very occasionally Vomiting: no Melena or hematochezia: no Rash: no Jaundice: no Fever: no Weight loss: no  Relevant past medical, surgical, family and social history reviewed and updated as indicated. Interim medical history since our last visit reviewed. Allergies and medications reviewed and updated.  Review of Systems  Constitutional: Negative.   Respiratory: Negative.   Cardiovascular: Negative.   Gastrointestinal: Positive for diarrhea and nausea. Negative for abdominal distention, abdominal pain, anal bleeding, blood in stool, constipation, rectal pain and vomiting.  Genitourinary: Negative.   Skin: Negative.   Neurological: Negative.   Psychiatric/Behavioral: Negative.     Per HPI unless specifically indicated above     Objective:    BP 127/85 (BP Location: Left Arm, Patient Position: Sitting, Cuff Size: Normal)    Pulse 75   Temp 98 F (36.7 C) (Oral)   Wt 242 lb (109.8 kg)   SpO2 97%   BMI 34.72 kg/m   Wt Readings from Last 3 Encounters:  12/27/19 242 lb (109.8 kg)  10/19/19 235 lb 14.3 oz (107 kg)  08/18/19 236 lb (107 kg)    Physical Exam Vitals and nursing note reviewed.  Constitutional:      General: He is not in acute distress.    Appearance: Normal appearance. He is not ill-appearing, toxic-appearing or diaphoretic.  HENT:     Head: Normocephalic and atraumatic.     Right Ear: External ear normal.     Left Ear: External ear normal.     Nose: Nose normal.     Mouth/Throat:     Mouth: Mucous membranes are moist.     Pharynx: Oropharynx is clear.  Eyes:     General: No scleral icterus.       Right eye: No discharge.        Left eye: No discharge.     Extraocular Movements: Extraocular movements intact.     Conjunctiva/sclera: Conjunctivae normal.     Pupils: Pupils are equal, round, and reactive to light.  Cardiovascular:     Rate and Rhythm: Normal rate and regular rhythm.     Pulses: Normal pulses.     Heart sounds: Normal heart sounds. No murmur heard.  No friction rub. No gallop.   Pulmonary:     Effort: Pulmonary effort is normal. No respiratory distress.     Breath sounds: Normal breath sounds. No stridor. No wheezing, rhonchi or rales.  Chest:     Chest  wall: No tenderness.  Musculoskeletal:        General: Normal range of motion.     Cervical back: Normal range of motion and neck supple.  Skin:    General: Skin is warm and dry.     Capillary Refill: Capillary refill takes less than 2 seconds.     Coloration: Skin is not jaundiced or pale.     Findings: No bruising, erythema, lesion or rash.  Neurological:     General: No focal deficit present.     Mental Status: He is alert and oriented to person, place, and time. Mental status is at baseline.  Psychiatric:        Mood and Affect: Mood normal.        Behavior: Behavior normal.        Thought Content: Thought  content normal.        Judgment: Judgment normal.     Results for orders placed or performed in visit on 12/27/19  Bayer DCA Hb A1c Waived  Result Value Ref Range   HB A1C (BAYER DCA - WAIVED) 9.3 (H) <7.0 %      Assessment & Plan:   Problem List Items Addressed This Visit      Endocrine   Diabetes mellitus type 2 with complications, uncontrolled ([place]) - Primary    Going in the wrong direction with A1c of 9.3 up from 7.8. Has been off his steglatro. Will restart it and recheck 3 months. Call with any concerns. Continue to monitor.       Relevant Medications   ertugliflozin L-PyroglutamicAc (STEGLATRO) 15 MG TABS tablet   Other Relevant Orders   Bayer DCA Hb A1c Waived (Completed)    Other Visit Diagnoses    Chronic diarrhea       Will check labs and stool studies. Await results. If normal will refer to GI for colonoscopy (due) and treat anxiety. Call with any concerns.    Relevant Orders   Comprehensive metabolic panel   TSH   CBC with Differential/Platelet   Lipase   Amylase   Ova and parasite examination   Fecal occult blood, imunochemical([facility])   Stool C-Diff Toxin Assay   Stool Culture   Fecal leukocytes   Need for influenza vaccination       Flu shot given today.   Relevant Orders   Flu Vaccine QUAD 6+ mos PF IM (Fluarix Quad PF) (Completed)       Follow up plan: Return in about 4 weeks (around 01/24/2020).

## 2019-12-27 NOTE — Assessment & Plan Note (Signed)
Going in the wrong direction with A1c of 9.3 up from 7.8. Has been off his steglatro. Will restart it and recheck 3 months. Call with any concerns. Continue to monitor.

## 2019-12-28 ENCOUNTER — Other Ambulatory Visit: Payer: Self-pay | Admitting: Family Medicine

## 2019-12-28 LAB — COMPREHENSIVE METABOLIC PANEL
ALT: 36 IU/L (ref 0–44)
AST: 24 IU/L (ref 0–40)
Albumin/Globulin Ratio: 1.8 (ref 1.2–2.2)
Albumin: 4.6 g/dL (ref 4.0–5.0)
Alkaline Phosphatase: 65 IU/L (ref 48–121)
BUN/Creatinine Ratio: 19 (ref 9–20)
BUN: 16 mg/dL (ref 6–24)
Bilirubin Total: 0.6 mg/dL (ref 0.0–1.2)
CO2: 24 mmol/L (ref 20–29)
Calcium: 9.8 mg/dL (ref 8.7–10.2)
Chloride: 100 mmol/L (ref 96–106)
Creatinine, Ser: 0.85 mg/dL (ref 0.76–1.27)
GFR calc Af Amer: 117 mL/min/{1.73_m2} (ref >59)
GFR calc non Af Amer: 102 mL/min/{1.73_m2} (ref >59)
Globulin, Total: 2.5 g/dL (ref 1.5–4.5)
Glucose: 203 mg/dL — ABNORMAL HIGH (ref 65–99)
Potassium: 5.3 mmol/L — ABNORMAL HIGH (ref 3.5–5.2)
Sodium: 138 mmol/L (ref 134–144)
Total Protein: 7.1 g/dL (ref 6.0–8.5)

## 2019-12-28 LAB — CBC WITH DIFFERENTIAL/PLATELET
Basophils Absolute: 0 10*3/uL (ref 0.0–0.2)
Basos: 1 %
EOS (ABSOLUTE): 0.5 10*3/uL — ABNORMAL HIGH (ref 0.0–0.4)
Eos: 8 %
Hematocrit: 47.9 % (ref 37.5–51.0)
Hemoglobin: 15.5 g/dL (ref 13.0–17.7)
Immature Grans (Abs): 0 10*3/uL (ref 0.0–0.1)
Immature Granulocytes: 0 %
Lymphocytes Absolute: 2 10*3/uL (ref 0.7–3.1)
Lymphs: 32 %
MCH: 28.1 pg (ref 26.6–33.0)
MCHC: 32.4 g/dL (ref 31.5–35.7)
MCV: 87 fL (ref 79–97)
Monocytes Absolute: 0.5 10*3/uL (ref 0.1–0.9)
Monocytes: 7 %
Neutrophils Absolute: 3.3 10*3/uL (ref 1.4–7.0)
Neutrophils: 52 %
Platelets: 284 10*3/uL (ref 150–450)
RBC: 5.51 x10E6/uL (ref 4.14–5.80)
RDW: 13.4 % (ref 11.6–15.4)
WBC: 6.4 10*3/uL (ref 3.4–10.8)

## 2019-12-28 LAB — AMYLASE: Amylase: 41 U/L (ref 31–110)

## 2019-12-28 LAB — TSH: TSH: 1.85 u[IU]/mL (ref 0.450–4.500)

## 2019-12-28 LAB — LIPASE: Lipase: 32 U/L (ref 13–78)

## 2019-12-28 NOTE — Telephone Encounter (Signed)
Requested medication (s) are due for refill today: yes  Requested medication (s) are on the active medication list:yes  Last refill: 08/18/19  #30  1 refill  Future visit scheduled  yes 02/01/20  Notes to clinic: not delegated  Requested Prescriptions  Pending Prescriptions Disp Refills   ALPRAZolam (XANAX) 0.5 MG tablet [Pharmacy Med Name: ALPRAZOLAM 0.5 MG TABLET] 30 tablet 1    Sig: Take 1 tablet (0.5 mg total) by mouth 2 (two) times daily as needed for anxiety.      Not Delegated - Psychiatry:  Anxiolytics/Hypnotics Failed - 12/28/2019  6:00 PM      Failed - This refill cannot be delegated      Failed - Urine Drug Screen completed in last 360 days.      Passed - Valid encounter within last 6 months    Recent Outpatient Visits           Yesterday Diabetes mellitus type 2 with complications, uncontrolled ([place])   [place], Megan P, DO   4 months ago Routine general medical examination at a health care facility   [facility] [place] P, DO   7 months ago [place], Megan P, DO   10 months ago Type 2 diabetes mellitus without complication, without long-term current use of insulin ([place])   [place], Megan P, DO   1 year ago Type 2 diabetes mellitus without complication, without long-term current use of insulin [facility])   [place], [place], DO       Future Appointments             In 1 month Johnson, Barb Merino, DO [employer]

## 2019-12-29 NOTE — Telephone Encounter (Signed)
Routing to provider  

## 2020-01-05 ENCOUNTER — Encounter: Payer: Self-pay | Admitting: Family Medicine

## 2020-01-12 ENCOUNTER — Other Ambulatory Visit: Payer: Self-pay | Admitting: Family Medicine

## 2020-01-12 MED ORDER — BUPROPION HCL ER (XL) 150 MG PO TB24
150.0000 mg | ORAL_TABLET | Freq: Every day | ORAL | 1 refills | Status: DC
Start: 2020-01-12 — End: 2020-10-10

## 2020-02-01 ENCOUNTER — Ambulatory Visit: Payer: BC Managed Care – PPO | Admitting: Family Medicine

## 2020-04-11 ENCOUNTER — Ambulatory Visit: Payer: BC Managed Care – PPO | Admitting: Family Medicine

## 2020-04-25 ENCOUNTER — Telehealth: Payer: Self-pay

## 2020-04-25 NOTE — Telephone Encounter (Signed)
Pt has forms that need to be completed for new job verifying he is able to do the job even thou he has medical issues, Pt asked if forms can be filled as he had apt 4 months ago but currently has no insurance and will have ins one he starts new job that he needs form signed for.

## 2020-04-26 ENCOUNTER — Encounter: Payer: Self-pay | Admitting: Family Medicine

## 2020-04-26 NOTE — Telephone Encounter (Signed)
Forms placed in your folder for review.

## 2020-04-26 NOTE — Telephone Encounter (Signed)
I filled out a form about this last week- is it the same one?

## 2020-05-21 ENCOUNTER — Encounter: Payer: Self-pay | Admitting: Family Medicine

## 2020-05-22 ENCOUNTER — Other Ambulatory Visit: Payer: Self-pay

## 2020-05-22 ENCOUNTER — Ambulatory Visit
Admission: RE | Admit: 2020-05-22 | Discharge: 2020-05-22 | Disposition: A | Discharge status: Home / Self Care | Payer: Self-pay | Source: Ambulatory Visit | Attending: Family Medicine | Admitting: Family Medicine

## 2020-05-22 ENCOUNTER — Telehealth (INDEPENDENT_AMBULATORY_CARE_PROVIDER_SITE_OTHER): Payer: Self-pay | Admitting: Family Medicine

## 2020-05-22 ENCOUNTER — Encounter: Payer: Self-pay | Admitting: Family Medicine

## 2020-05-22 ENCOUNTER — Ambulatory Visit
Admission: RE | Admit: 2020-05-22 | Discharge: 2020-05-22 | Disposition: A | Discharge status: Home / Self Care | Payer: Self-pay | Attending: Family Medicine | Admitting: Family Medicine

## 2020-05-22 DIAGNOSIS — Z20822 Contact with and (suspected) exposure to covid-19: Secondary | ICD-10-CM

## 2020-05-22 DIAGNOSIS — R059 Cough, unspecified: Secondary | ICD-10-CM

## 2020-05-22 DIAGNOSIS — L509 Urticaria, unspecified: Secondary | ICD-10-CM

## 2020-05-22 DIAGNOSIS — R0981 Nasal congestion: Secondary | ICD-10-CM

## 2020-05-22 MED ORDER — HYDROCOD POLST-CPM POLST ER 10-8 MG/5ML PO SUER
5.0000 mL | Freq: Two times a day (BID) | ORAL | 0 refills | Status: DC | PRN
Start: 2020-05-22 — End: 2020-10-10

## 2020-05-22 MED ORDER — TRIAMCINOLONE ACETONIDE 0.5 % EX OINT
1.0000 | TOPICAL_OINTMENT | Freq: Two times a day (BID) | CUTANEOUS | 3 refills | Status: DC
Start: 2020-05-22 — End: 2020-11-11

## 2020-05-22 MED ORDER — PREDNISONE 10 MG PO TABS: 10.0000 mg | ORAL_TABLET | Freq: Every day | ORAL | 0 refills | Status: DC

## 2020-05-22 MED ORDER — BENZONATATE 200 MG PO CAPS: 200.0000 mg | ORAL_CAPSULE | Freq: Two times a day (BID) | ORAL | 0 refills | Status: DC | PRN

## 2020-05-22 NOTE — Patient Instructions (Signed)
[address], [place], [place] [address]  

## 2020-05-22 NOTE — Progress Notes (Signed)
There were no vitals taken for this visit.   Subjective:    Patient ID: Glenn Flynn, male    DOB: 22-Dec-1970, 50 y.o.   MRN: [id number]  HPI: Glenn Flynn is a 50 y.o. male  Chief Complaint  Patient presents with  . Covid Exposure    Patient wife was positive for covid. Patient is having sinus pressure, fatigue, congestion and cough. Symptoms began 1/18. Has not been tested for covid.   . Rash    Patient states he has a rash on his torso and back. Patient describe the rash as red and itchy, states he had the rash last year in the same spot. Would like to know if he can be prescribed an ointment for it.    UPPER RESPIRATORY TRACT INFECTION Duration: 2 weeks Worst symptom: fatigue, cough, congestion Fever: no Cough: yes Shortness of breath: no Wheezing: no Chest pain: no Chest tightness: no Chest congestion: no Nasal congestion: yes Runny nose: yes Post nasal drip: yes Sneezing: yes Sore throat: yes Swollen glands: no Sinus pressure: yes Headache: yes Face pain: no Toothache: no Ear pain: no  Ear pressure: no  Eyes red/itching:no Eye drainage/crusting: no  Vomiting: no Rash: no Fatigue: yes Sick contacts: yes Strep contacts: no  Context: better Recurrent sinusitis: no Relief with OTC cold/cough medications: no  Treatments attempted: nyquil and dayquil   Rash on his chest has come back. Would like a refill on his triamcinalone.   Relevant past medical, surgical, family and social history reviewed and updated as indicated. Interim medical history since our last visit reviewed. Allergies and medications reviewed and updated.  Review of Systems  Constitutional: Positive for fatigue. Negative for activity change, appetite change, chills, diaphoresis, fever and unexpected weight change.  HENT: Positive for postnasal drip, rhinorrhea, sinus pressure, sinus pain and sneezing. Negative for congestion, dental problem, drooling, ear discharge, ear pain,  facial swelling, hearing loss, mouth sores, nosebleeds, sore throat, tinnitus, trouble swallowing and voice change.   Respiratory: Positive for cough. Negative for apnea, choking, chest tightness, shortness of breath, wheezing and stridor.   Cardiovascular: Negative.   Psychiatric/Behavioral: Negative.     Per HPI unless specifically indicated above     Objective:    There were no vitals taken for this visit.  Wt Readings from Last 3 Encounters:  12/27/19 242 lb (109.8 kg)  10/19/19 235 lb 14.3 oz (107 kg)  08/18/19 236 lb (107 kg)    Physical Exam Vitals and nursing note reviewed.  Constitutional:      General: He is not in acute distress.    Appearance: Normal appearance. He is not ill-appearing, toxic-appearing or diaphoretic.  HENT:     Head: Normocephalic and atraumatic.     Right Ear: External ear normal.     Left Ear: External ear normal.     Nose: Nose normal.     Mouth/Throat:     Mouth: Mucous membranes are moist.     Pharynx: Oropharynx is clear.  Eyes:     General: No scleral icterus.       Right eye: No discharge.        Left eye: No discharge.     Conjunctiva/sclera: Conjunctivae normal.     Pupils: Pupils are equal, round, and reactive to light.  Pulmonary:     Effort: Pulmonary effort is normal. No respiratory distress.     Comments: Speaking in full sentences Musculoskeletal:        General: Normal range  of motion.     Cervical back: Normal range of motion.  Skin:    Coloration: Skin is not jaundiced or pale.     Findings: No bruising, erythema, lesion or rash.  Neurological:     Mental Status: He is alert and oriented to person, place, and time. Mental status is at baseline.  Psychiatric:        Mood and Affect: Mood normal.        Behavior: Behavior normal.        Thought Content: Thought content normal.        Judgment: Judgment normal.     Results for orders placed or performed in visit on 12/27/19  Bayer DCA Hb A1c Waived  Result Value  Ref Range   HB A1C (BAYER DCA - WAIVED) 9.3 (H) <7.0 %  Comprehensive metabolic panel  Result Value Ref Range   Glucose 203 (H) 65 - 99 mg/dL   BUN 16 6 - 24 mg/dL   Creatinine, Ser 0.85 0.76 - 1.27 mg/dL   GFR calc non Af Amer 102 >59 mL/min/1.73   GFR calc Af Amer 117 >59 mL/min/1.73   BUN/Creatinine Ratio 19 9 - 20   Sodium 138 134 - 144 mmol/L   Potassium 5.3 (H) 3.5 - 5.2 mmol/L   Chloride 100 96 - 106 mmol/L   CO2 24 20 - 29 mmol/L   Calcium 9.8 8.7 - 10.2 mg/dL   Total Protein 7.1 6.0 - 8.5 g/dL   Albumin 4.6 4.0 - 5.0 g/dL   Globulin, Total 2.5 1.5 - 4.5 g/dL   Albumin/Globulin Ratio 1.8 1.2 - 2.2   Bilirubin Total 0.6 0.0 - 1.2 mg/dL   Alkaline Phosphatase 65 48 - 121 IU/L   AST 24 0 - 40 IU/L   ALT 36 0 - 44 IU/L  TSH  Result Value Ref Range   TSH 1.850 0.450 - 4.500 uIU/mL  CBC with Differential/Platelet  Result Value Ref Range   WBC 6.4 3.4 - 10.8 x10E3/uL   RBC 5.51 4.14 - 5.80 x10E6/uL   Hemoglobin 15.5 13.0 - 17.7 g/dL   Hematocrit 47.9 37.5 - 51.0 %   MCV 87 79 - 97 fL   MCH 28.1 26.6 - 33.0 pg   MCHC 32.4 31.5 - 35.7 g/dL   RDW 13.4 11.6 - 15.4 %   Platelets 284 150 - 450 x10E3/uL   Neutrophils 52 Not Estab. %   Lymphs 32 Not Estab. %   Monocytes 7 Not Estab. %   Eos 8 Not Estab. %   Basos 1 Not Estab. %   Neutrophils Absolute 3.3 1.4 - 7.0 x10E3/uL   Lymphocytes Absolute 2.0 0.7 - 3.1 x10E3/uL   Monocytes Absolute 0.5 0.1 - 0.9 x10E3/uL   EOS (ABSOLUTE) 0.5 (H) 0.0 - 0.4 x10E3/uL   Basophils Absolute 0.0 0.0 - 0.2 x10E3/uL   Immature Granulocytes 0 Not Estab. %   Immature Grans (Abs) 0.0 0.0 - 0.1 x10E3/uL  Lipase  Result Value Ref Range   Lipase 32 13 - 78 U/L  Amylase  Result Value Ref Range   Amylase 41 31 - 110 U/L      Assessment & Plan:   Problem List Items Addressed This Visit   None   Visit Diagnoses    Suspected COVID-19 virus infection    -  Primary   Out of the quarantine window, so will hold on testing. Will check CXR. Call  with any concerns.    Relevant Orders  DG Chest 2 View   Nasal congestion       Will treat with prednisone, tussionex and tessalon perles. Continue to monitor. Call if not getting better or getting worse.    Cough       Will get CXR to make sure no post-covid pneumonia. Call with any concerns.    Relevant Orders   DG Chest 2 View   Hives       WIll refill his trimacinalone. Call if not getting better or getting worse.        Follow up plan: Return if symptoms worsen or fail to improve.   . This visit was completed via MyChart due to the restrictions of the COVID-19 pandemic. All issues as above were discussed and addressed. Physical exam was done as above through visual confirmation on MyChart. If it was felt that the patient should be evaluated in the office, they were directed there. The patient verbally consented to this visit. . Location of the patient: home . Location of the provider: work . Those involved with this call:  . Provider: Park Liter, DO . CMA: Louanna Raw, [place] . Front Desk/Registration: Jill Side  . Time spent on call: 15 minutes with patient face to face via video conference. More than 50% of this time was spent in counseling and coordination of care. 23 minutes total spent in review of patient's record and preparation of their chart.

## 2020-06-25 ENCOUNTER — Other Ambulatory Visit: Payer: Self-pay | Admitting: Family Medicine

## 2020-06-25 NOTE — Telephone Encounter (Signed)
Requested medications are due for refill today yes  Requested medications are on the active medication list yes  Last refill 05/22/20  Last visit 05/22/20  Future visit scheduled no  Notes to clinic Unclear if this was to be continued, please assess.

## 2020-06-29 ENCOUNTER — Other Ambulatory Visit: Payer: Self-pay | Admitting: Family Medicine

## 2020-06-29 NOTE — Telephone Encounter (Signed)
MyChart message sent to pt to call office to schedule appt for med refill/BW Last RF: 08/18/19 #360 1 RF Refill due: yes Active med list: yes Future visit scheduled: no

## 2020-07-01 NOTE — Telephone Encounter (Signed)
Called pt to schedule no answer left vm  

## 2020-07-03 NOTE — Telephone Encounter (Signed)
Called pt to schedule no answer left vm  

## 2020-07-04 ENCOUNTER — Encounter: Payer: Self-pay | Admitting: Family Medicine

## 2020-07-04 NOTE — Telephone Encounter (Signed)
Called pt to schedule no answer left vm sent letter

## 2020-07-04 NOTE — Telephone Encounter (Signed)
Dr. Wynetta Emery patient. [place] office has tried to contact and sent letter. Please refuse or refill RX.

## 2020-07-08 ENCOUNTER — Other Ambulatory Visit: Payer: Self-pay | Admitting: Family Medicine

## 2020-07-08 NOTE — Telephone Encounter (Signed)
Routing to provider  

## 2020-07-08 NOTE — Telephone Encounter (Signed)
Requested medication (s) are due for refill today:   Provider to review  Requested medication (s) are on the active medication list:   Yes  Future visit scheduled:   No   Last ordered: 01/08/2021, #30, 1 refill  Non delegated refill   Requested Prescriptions  Pending Prescriptions Disp Refills   ALPRAZolam (XANAX) 0.5 MG tablet [Pharmacy Med Name: ALPRAZOLAM 0.5 MG TABLET] 30 tablet     Sig: TAKE 1 TABLET (0.5 MG TOTAL) BY MOUTH 2 (TWO) TIMES DAILY AS NEEDED FOR ANXIETY.      Not Delegated - Psychiatry:  Anxiolytics/Hypnotics Failed - 07/08/2020 10:09 AM      Failed - This refill cannot be delegated      Failed - Urine Drug Screen completed in last 360 days      Passed - Valid encounter within last 6 months    Recent Outpatient Visits           1 month ago Suspected COVID-19 virus infection   [facility] [place], [place], DO   6 months ago Diabetes mellitus type 2 with complications, uncontrolled ([place])   [place], Megan P, DO   10 months ago Routine general medical examination at a health care facility   [place], [place], DO   1 year ago [place], [place], DO   1 year ago Type 2 diabetes mellitus without complication, without long-term current use of insulin ([place])   [place], [place], DO

## 2020-10-10 ENCOUNTER — Other Ambulatory Visit: Payer: Self-pay

## 2020-10-10 ENCOUNTER — Encounter: Payer: Self-pay | Admitting: Family Medicine

## 2020-10-10 ENCOUNTER — Ambulatory Visit: Payer: No Typology Code available for payment source | Admitting: Family Medicine

## 2020-10-10 VITALS — BP 120/77 | HR 75 | Temp 98.2°F | Ht 71.0 in | Wt 235.2 lb

## 2020-10-10 DIAGNOSIS — E1165 Type 2 diabetes mellitus with hyperglycemia: Secondary | ICD-10-CM

## 2020-10-10 DIAGNOSIS — Z1211 Encounter for screening for malignant neoplasm of colon: Secondary | ICD-10-CM

## 2020-10-10 DIAGNOSIS — Z125 Encounter for screening for malignant neoplasm of prostate: Secondary | ICD-10-CM | POA: Diagnosis not present

## 2020-10-10 DIAGNOSIS — F419 Anxiety disorder, unspecified: Secondary | ICD-10-CM

## 2020-10-10 DIAGNOSIS — E782 Mixed hyperlipidemia: Secondary | ICD-10-CM | POA: Diagnosis not present

## 2020-10-10 DIAGNOSIS — E118 Type 2 diabetes mellitus with unspecified complications: Secondary | ICD-10-CM

## 2020-10-10 DIAGNOSIS — Z0000 Encounter for general adult medical examination without abnormal findings: Secondary | ICD-10-CM | POA: Diagnosis not present

## 2020-10-10 DIAGNOSIS — IMO0002 Reserved for concepts with insufficient information to code with codable children: Secondary | ICD-10-CM

## 2020-10-10 LAB — URINALYSIS, ROUTINE W REFLEX MICROSCOPIC
Bilirubin, UA: NEGATIVE
Leukocytes,UA: NEGATIVE
Nitrite, UA: NEGATIVE
Protein,UA: NEGATIVE
RBC, UA: NEGATIVE
Specific Gravity, UA: 1.01 (ref 1.005–1.030)
Urobilinogen, Ur: 0.2 mg/dL (ref 0.2–1.0)
pH, UA: 5 (ref 5.0–7.5)

## 2020-10-10 LAB — MICROALBUMIN, URINE WAIVED
Creatinine, Urine Waived: 50 mg/dL (ref 10–300)
Microalb, Ur Waived: 10 mg/L (ref 0–19)

## 2020-10-10 LAB — BAYER DCA HB A1C WAIVED: HB A1C (BAYER DCA - WAIVED): 12 % — ABNORMAL HIGH (ref <7.0)

## 2020-10-10 MED ORDER — ESCITALOPRAM OXALATE 20 MG PO TABS: ORAL_TABLET | ORAL | 1 refills | Status: DC

## 2020-10-10 MED ORDER — BUPROPION HCL ER (XL) 150 MG PO TB24: 150.0000 mg | ORAL_TABLET | Freq: Every day | ORAL | 1 refills | Status: DC

## 2020-10-10 MED ORDER — TRAZODONE HCL 100 MG PO TABS: 100.0000 mg | ORAL_TABLET | Freq: Every day | ORAL | 1 refills | Status: DC

## 2020-10-10 MED ORDER — METFORMIN HCL ER 500 MG PO TB24: ORAL_TABLET | ORAL | 1 refills | Status: DC

## 2020-10-10 MED ORDER — ALPRAZOLAM 0.5 MG PO TABS: 0.5000 mg | ORAL_TABLET | Freq: Two times a day (BID) | ORAL | 0 refills | Status: DC | PRN

## 2020-10-10 MED ORDER — ATORVASTATIN CALCIUM 80 MG PO TABS: 80.0000 mg | ORAL_TABLET | Freq: Every day | ORAL | 1 refills | Status: DC

## 2020-10-10 NOTE — Assessment & Plan Note (Signed)
Under good control on current regimen. Continue current regimen. Continue to monitor. Call with any concerns. Refills given.   

## 2020-10-10 NOTE — Assessment & Plan Note (Signed)
Not under good control with A1c of 12.0- will restart his metformin and recheck 1 month. Call with any concerns.

## 2020-10-10 NOTE — Progress Notes (Signed)
BP 120/77   Pulse 75   Temp 98.2 F (36.8 C)   Ht 5\' 11"  (1.803 m)   Wt 235 lb 3.2 oz (106.7 kg)   SpO2 98%   BMI 32.80 kg/m    Subjective:    Patient ID: Glenn Flynn, male    DOB: July 19, 1970, 50 y.o.   MRN: [id number]  HPI: Glenn Flynn is a 50 y.o. male presenting on 10/10/2020 for comprehensive medical examination. Current medical complaints include:  HYPERLIPIDEMIA Hyperlipidemia status: poor compliance Satisfied with current treatment?  yes Side effects:  no Medication compliance: poor compliance Past cholesterol meds: atorvastain (lipitor) Supplements: none Aspirin:  yes The ASCVD Risk score [name], et al., 2013) failed to calculate for the following reasons:   The patient has a prior MI or stroke diagnosis Chest pain:  no Coronary artery disease:  yes  DIABETES- out of the metformin for at least a few weeks Hypoglycemic episodes:no Polydipsia/polyuria: yes Visual disturbance: yes Chest pain: no Paresthesias: yes Glucose Monitoring: no  Accucheck frequency: Not Checking Taking Insulin?: no Blood Pressure Monitoring: not checking Retinal Examination: Up to Date Foot Exam: Up to Date Diabetic Education: Completed Pneumovax: Up to Date Influenza: Up to Date Aspirin: no  ANXIETY/STRESS Duration: chronic Status:exacerbated Anxious mood: yes  Excessive worrying: yes Irritability: no  Sweating: no Nausea: no Palpitations:no Hyperventilation: no Panic attacks: no Agoraphobia: no  Obscessions/compulsions: no Depressed mood: no Depression screen [facility] 2/9 10/10/2020 08/18/2019 05/26/2019 02/21/2019 08/15/2018  Decreased Interest 1 0 0 0 0  Down, Depressed, Hopeless 1 1 1  0 0  PHQ - 2 Score 2 1 1  0 0  Altered sleeping 0 0 0 3 3  Tired, decreased energy 0 1 1 1 3   Change in appetite 0 0 0 0 0  Feeling bad or failure about yourself  1 0 0 0 0  Trouble concentrating 0 0 0 0 0  Moving slowly or fidgety/restless 0 0 0 0 0  Suicidal thoughts 0 0  0 0 0  PHQ-9 Score 3 2 2 4 6   Difficult doing work/chores Not difficult at all Not difficult at all Not difficult at all Not difficult at all Not difficult at all  Some recent data might be hidden   GAD 7 : Generalized Anxiety Score 10/10/2020 08/18/2019 05/26/2019 02/21/2019  Nervous, Anxious, on Edge 2 1 1 1   Control/stop worrying 2 1 0 0  Worry too much - different things 2 0 0 0  Trouble relaxing 2 0 0 0  Restless 0 0 0 0  Easily annoyed or irritable 1 1 1 1   Afraid - awful might happen 0 - 0 0  Total GAD 7 Score 9 - 2 2  Anxiety Difficulty Not difficult at all Not difficult at all Somewhat difficult Not difficult at all   Anhedonia: no Weight changes: no Insomnia: yes hard to stay asleep  Hypersomnia: no Fatigue/loss of energy: yes Feelings of worthlessness: no Feelings of guilt: no Impaired concentration/indecisiveness: no Suicidal ideations: no  Crying spells: no Recent Stressors/Life Changes: no   Relationship problems: no   Family stress: no     Financial stress: yes    Job stress: yes    Recent death/loss: no  Interim Problems from his last visit: no  Depression Screen done today and results listed below:  Depression screen [facility] 2/9 10/10/2020 08/18/2019 05/26/2019 02/21/2019 08/15/2018  Decreased Interest 1 0 0 0 0  Down, Depressed, Hopeless 1 1 1  0 0  PHQ - 2 Score 2 1 1  0 0  Altered sleeping 0 0 0 3 3  Tired, decreased energy 0 1 1 1 3   Change in appetite 0 0 0 0 0  Feeling bad or failure about yourself  1 0 0 0 0  Trouble concentrating 0 0 0 0 0  Moving slowly or fidgety/restless 0 0 0 0 0  Suicidal thoughts 0 0 0 0 0  PHQ-9 Score 3 2 2 4 6   Difficult doing work/chores Not difficult at all Not difficult at all Not difficult at all Not difficult at all Not difficult at all  Some recent data might be hidden    Past Medical History:  Past Medical History:  Diagnosis Date   Agoraphobia    Allergy    Anxiety    Depression    Diabetes mellitus without  complication ([place])    Hyperlipidemia    Migraines     Surgical History:  Past Surgical History:  Procedure Laterality Date   LEFT HEART CATH AND CORONARY ANGIOGRAPHY N/A 02/21/2018   Procedure: LEFT HEART CATH AND CORONARY ANGIOGRAPHY;  Surgeon: Corey Skains, MD;  Location: [place] [facility];  Service: Cardiovascular;  Laterality: N/A;   LEFT HEART CATH AND CORONARY ANGIOGRAPHY N/A 03/27/2019   Procedure: LEFT HEART CATH AND CORONARY ANGIOGRAPHY and possible PCI and stent;  Surgeon: Yolonda Kida, MD;  Location: [place] [facility];  Service: Cardiovascular;  Laterality: N/A;   TONSILLECTOMY  Age 48    Medications:  Current Outpatient Medications on File Prior to Visit  Medication Sig   ALPRAZolam (XANAX) 0.5 MG tablet TAKE 1 TABLET (0.5 MG TOTAL) BY MOUTH 2 (TWO) TIMES DAILY AS NEEDED FOR ANXIETY.   buPROPion (WELLBUTRIN XL) 150 MG 24 hr tablet Take 1 tablet (150 mg total) by mouth daily.   escitalopram (LEXAPRO) 20 MG tablet Take 1 tablet (20 mg total) by mouth at bedtime.   metFORMIN (GLUCOPHAGE-XR) 500 MG 24 hr tablet TAKE 2 TABLETS TWO TIMES A DAY   Multiple Vitamins-Minerals (MULTIVITAMIN WITH MINERALS) tablet Take 1 tablet by mouth daily.   traZODone (DESYREL) 100 MG tablet Take 1 tablet (100 mg total) by mouth at bedtime.   triamcinolone ointment (KENALOG) 0.5 % Apply 1 application topically 2 (two) times daily. (Patient not taking: Reported on 10/10/2020)   [DISCONTINUED] atorvastatin (LIPITOR) 80 MG tablet Take 1 tablet (80 mg total) by mouth daily at 6 PM.   No current facility-administered medications on file prior to visit.    Allergies:  No Known Allergies  Social History:  Social History   Socioeconomic History   Marital status: Married    Spouse name: Not on file   Number of children: Not on file   Years of education: Not on file   Highest education level: Not on file  Occupational History   Not on file  Tobacco Use   Smoking status: Never    Smokeless tobacco: Never  Vaping Use   Vaping Use: Never used  Substance and Sexual Activity   Alcohol use: No    Alcohol/week: 0.0 standard drinks    Comment: rare   Drug use: No   Sexual activity: Yes  Other Topics Concern   Not on file  Social History Narrative   Not on file   Social Determinants of Health   Financial Resource Strain: Not on file  Food Insecurity: Not on file  Transportation Needs: Not on file  Physical Activity: Not on file  Stress: Not on file  Social Connections: Not on file  Intimate Partner Violence: Not on file   Social History   Tobacco Use  Smoking Status Never  Smokeless Tobacco Never   Social History   Substance and Sexual Activity  Alcohol Use No   Alcohol/week: 0.0 standard drinks   Comment: rare    Family History:  Family History  Problem Relation Age of Onset   Diabetes Mother    Hypertension Mother    Diabetes Father    Hypertension Father    Heart disease Maternal Grandmother    Heart disease Maternal Grandfather    Stroke Paternal Grandfather    Cancer Neg Hx    COPD Neg Hx     Past medical history, surgical history, medications, allergies, family history and social history reviewed with patient today and changes made to appropriate areas of the chart.   Review of Systems  Constitutional: Negative.   HENT:  Positive for hearing loss. Negative for congestion, ear discharge, ear pain, nosebleeds, sinus pain, sore throat and tinnitus.   Eyes: Negative.   Respiratory: Negative.  Negative for stridor.   Cardiovascular: Negative.   Gastrointestinal:  Positive for heartburn (with food choices). Negative for abdominal pain, blood in stool, constipation, diarrhea, melena, nausea and vomiting.  Genitourinary: Negative.   Musculoskeletal: Negative.   Skin: Negative.   Neurological: Negative.   Endo/Heme/Allergies: Negative.   Psychiatric/Behavioral:  Negative for depression, hallucinations, memory loss, substance abuse and  suicidal ideas. The patient is nervous/anxious. The patient does not have insomnia.   All other ROS negative except what is listed above and in the HPI.      Objective:    BP 120/77   Pulse 75   Temp 98.2 F (36.8 C)   Ht 5\' 11"  (1.803 m)   Wt 235 lb 3.2 oz (106.7 kg)   SpO2 98%   BMI 32.80 kg/m   Wt Readings from Last 3 Encounters:  10/10/20 235 lb 3.2 oz (106.7 kg)  12/27/19 242 lb (109.8 kg)  10/19/19 235 lb 14.3 oz (107 kg)    Physical Exam Vitals and nursing note reviewed.  Constitutional:      General: He is not in acute distress.    Appearance: Normal appearance. He is obese. He is not ill-appearing, toxic-appearing or diaphoretic.  HENT:     Head: Normocephalic and atraumatic.     Right Ear: Tympanic membrane, ear canal and external ear normal. There is no impacted cerumen.     Left Ear: Tympanic membrane, ear canal and external ear normal. There is no impacted cerumen.     Nose: Nose normal. No congestion or rhinorrhea.     Mouth/Throat:     Mouth: Mucous membranes are moist.     Pharynx: Oropharynx is clear. No oropharyngeal exudate or posterior oropharyngeal erythema.  Eyes:     General: No scleral icterus.       Right eye: No discharge.        Left eye: No discharge.     Extraocular Movements: Extraocular movements intact.     Conjunctiva/sclera: Conjunctivae normal.     Pupils: Pupils are equal, round, and reactive to light.  Neck:     Vascular: No carotid bruit.  Cardiovascular:     Rate and Rhythm: Normal rate and regular rhythm.     Pulses: Normal pulses.     Heart sounds: No murmur heard.   No friction rub. No gallop.  Pulmonary:     Effort: Pulmonary  effort is normal. No respiratory distress.     Breath sounds: Normal breath sounds. No stridor. No wheezing, rhonchi or rales.  Chest:     Chest wall: No tenderness.  Abdominal:     General: Abdomen is flat. Bowel sounds are normal. There is no distension.     Palpations: Abdomen is soft. There is  no mass.     Tenderness: There is no abdominal tenderness. There is no right CVA tenderness, left CVA tenderness, guarding or rebound.     Hernia: No hernia is present.  Genitourinary:    Comments: Genital exam deferred with shared decision making Musculoskeletal:        General: No swelling, tenderness, deformity or signs of injury.     Cervical back: Normal range of motion and neck supple. No rigidity. No muscular tenderness.     Right lower leg: No edema.     Left lower leg: No edema.  Lymphadenopathy:     Cervical: No cervical adenopathy.  Skin:    General: Skin is warm and dry.     Capillary Refill: Capillary refill takes less than 2 seconds.     Coloration: Skin is not jaundiced or pale.     Findings: No bruising, erythema, lesion or rash.  Neurological:     General: No focal deficit present.     Mental Status: He is alert and oriented to person, place, and time.     Cranial Nerves: No cranial nerve deficit.     Sensory: No sensory deficit.     Motor: No weakness.     Coordination: Coordination normal.     Gait: Gait normal.     Deep Tendon Reflexes: Reflexes normal.  Psychiatric:        Mood and Affect: Mood normal.        Behavior: Behavior normal.        Thought Content: Thought content normal.        Judgment: Judgment normal.    Results for orders placed or performed in visit on 12/27/19  Bayer DCA Hb A1c Waived  Result Value Ref Range   HB A1C (BAYER DCA - WAIVED) 9.3 (H) <7.0 %  Comprehensive metabolic panel  Result Value Ref Range   Glucose 203 (H) 65 - 99 mg/dL   BUN 16 6 - 24 mg/dL   Creatinine, Ser 0.85 0.76 - 1.27 mg/dL   GFR calc non Af Amer 102 >59 mL/min/1.73   GFR calc Af Amer 117 >59 mL/min/1.73   BUN/Creatinine Ratio 19 9 - 20   Sodium 138 134 - 144 mmol/L   Potassium 5.3 (H) 3.5 - 5.2 mmol/L   Chloride 100 96 - 106 mmol/L   CO2 24 20 - 29 mmol/L   Calcium 9.8 8.7 - 10.2 mg/dL   Total Protein 7.1 6.0 - 8.5 g/dL   Albumin 4.6 4.0 - 5.0 g/dL    Globulin, Total 2.5 1.5 - 4.5 g/dL   Albumin/Globulin Ratio 1.8 1.2 - 2.2   Bilirubin Total 0.6 0.0 - 1.2 mg/dL   Alkaline Phosphatase 65 48 - 121 IU/L   AST 24 0 - 40 IU/L   ALT 36 0 - 44 IU/L  TSH  Result Value Ref Range   TSH 1.850 0.450 - 4.500 uIU/mL  CBC with Differential/Platelet  Result Value Ref Range   WBC 6.4 3.4 - 10.8 x10E3/uL   RBC 5.51 4.14 - 5.80 x10E6/uL   Hemoglobin 15.5 13.0 - 17.7 g/dL   Hematocrit 47.9 37.5 - 51.0 %  MCV 87 79 - 97 fL   MCH 28.1 26.6 - 33.0 pg   MCHC 32.4 31.5 - 35.7 g/dL   RDW 13.4 11.6 - 15.4 %   Platelets 284 150 - 450 x10E3/uL   Neutrophils 52 Not Estab. %   Lymphs 32 Not Estab. %   Monocytes 7 Not Estab. %   Eos 8 Not Estab. %   Basos 1 Not Estab. %   Neutrophils Absolute 3.3 1.4 - 7.0 x10E3/uL   Lymphocytes Absolute 2.0 0.7 - 3.1 x10E3/uL   Monocytes Absolute 0.5 0.1 - 0.9 x10E3/uL   EOS (ABSOLUTE) 0.5 (H) 0.0 - 0.4 x10E3/uL   Basophils Absolute 0.0 0.0 - 0.2 x10E3/uL   Immature Granulocytes 0 Not Estab. %   Immature Grans (Abs) 0.0 0.0 - 0.1 x10E3/uL  Lipase  Result Value Ref Range   Lipase 32 13 - 78 U/L  Amylase  Result Value Ref Range   Amylase 41 31 - 110 U/L      Assessment & Plan:   Problem List Items Addressed This Visit       Endocrine   Diabetes mellitus type 2 with complications, uncontrolled ([place])    Not under good control with A1c of 12.0- will restart his metformin and recheck 1 month. Call with any concerns.        Relevant Orders   Bayer DCA Hb A1c Waived   CBC with Differential/Platelet   Comprehensive metabolic panel   Microalbumin, Urine Waived   TSH   Urinalysis, Routine w reflex microscopic     Other   Hyperlipidemia    Under good control on current regimen. Continue current regimen. Continue to monitor. Call with any concerns. Refills given. Labs drawn today.        Relevant Orders   CBC with Differential/Platelet   Comprehensive metabolic panel   Lipid Panel w/o Chol/HDL Ratio    Anxiety    Under good control on current regimen. Continue current regimen. Continue to monitor. Call with any concerns. Refills given.        Other Visit Diagnoses     Routine general medical examination at a health care facility    -  Primary   Vaccines up to date. Screening labs checked today. Colonoscopy ordered today. Continue diet and exercise. Call with any concerns.    Screening for prostate cancer       Labs drawn today. Await results.    Relevant Orders   PSA   Screening for colon cancer       Referral to GI made today.        Discussed aspirin prophylaxis for myocardial infarction prevention and decision was made to continue ASA  LABORATORY TESTING:  Health maintenance labs ordered today as discussed above.   The natural history of prostate cancer and ongoing controversy regarding screening and potential treatment outcomes of prostate cancer has been discussed with the patient. The meaning of a false positive PSA and a false negative PSA has been discussed. He indicates understanding of the limitations of this screening test and wishes to proceed with screening PSA testing.   IMMUNIZATIONS:   - Tdap: Tetanus vaccination status reviewed: last tetanus booster within 10 years. - Influenza: Postponed to flu season - Pneumovax: Up to date - Prevnar: Not applicable - HPV: Up to date - Zostavax vaccine: Given elsewhere  SCREENING: - Colonoscopy: Ordered today  Discussed with patient purpose of the colonoscopy is to detect colon cancer at curable precancerous or early stages  PATIENT COUNSELING:    Sexuality: Discussed sexually transmitted diseases, partner selection, use of condoms, avoidance of unintended pregnancy  and contraceptive alternatives.   Advised to avoid cigarette smoking.  I discussed with the patient that most people either abstain from alcohol or drink within safe limits (<=14/week and <=4 drinks/occasion for males, <=7/weeks and <= 3 drinks/occasion  for females) and that the risk for alcohol disorders and other health effects rises proportionally with the number of drinks per week and how often a drinker exceeds daily limits.  Discussed cessation/primary prevention of drug use and availability of treatment for abuse.   Diet: Encouraged to adjust caloric intake to maintain  or achieve ideal body weight, to reduce intake of dietary saturated fat and total fat, to limit sodium intake by avoiding high sodium foods and not adding table salt, and to maintain adequate dietary potassium and calcium preferably from fresh fruits, vegetables, and low-fat dairy products.    stressed the importance of regular exercise  Injury prevention: Discussed safety belts, safety helmets, smoke detector, smoking near bedding or upholstery.   Dental health: Discussed importance of regular tooth brushing, flossing, and dental visits.   Follow up plan: NEXT PREVENTATIVE PHYSICAL DUE IN 1 YEAR. Return in about 4 weeks (around 11/07/2020).

## 2020-10-10 NOTE — Assessment & Plan Note (Signed)
Under good control on current regimen. Continue current regimen. Continue to monitor. Call with any concerns. Refills given. Labs drawn today.   

## 2020-10-11 LAB — CBC WITH DIFFERENTIAL/PLATELET
Basophils Absolute: 0 10*3/uL (ref 0.0–0.2)
Basos: 0 %
EOS (ABSOLUTE): 0.3 10*3/uL (ref 0.0–0.4)
Eos: 4 %
Hematocrit: 47.9 % (ref 37.5–51.0)
Hemoglobin: 16.3 g/dL (ref 13.0–17.7)
Immature Grans (Abs): 0 10*3/uL (ref 0.0–0.1)
Immature Granulocytes: 0 %
Lymphocytes Absolute: 2.4 10*3/uL (ref 0.7–3.1)
Lymphs: 33 %
MCH: 29.8 pg (ref 26.6–33.0)
MCHC: 34 g/dL (ref 31.5–35.7)
MCV: 88 fL (ref 79–97)
Monocytes Absolute: 0.6 10*3/uL (ref 0.1–0.9)
Monocytes: 8 %
Neutrophils Absolute: 3.9 10*3/uL (ref 1.4–7.0)
Neutrophils: 55 %
Platelets: 285 10*3/uL (ref 150–450)
RBC: 5.47 x10E6/uL (ref 4.14–5.80)
RDW: 12.3 % (ref 11.6–15.4)
WBC: 7.2 10*3/uL (ref 3.4–10.8)

## 2020-10-11 LAB — COMPREHENSIVE METABOLIC PANEL
ALT: 25 IU/L (ref 0–44)
AST: 11 IU/L (ref 0–40)
Albumin/Globulin Ratio: 1.7 (ref 1.2–2.2)
Albumin: 4.5 g/dL (ref 3.8–4.9)
Alkaline Phosphatase: 89 IU/L (ref 44–121)
BUN/Creatinine Ratio: 16 (ref 9–20)
BUN: 13 mg/dL (ref 6–24)
Bilirubin Total: 0.5 mg/dL (ref 0.0–1.2)
CO2: 19 mmol/L — ABNORMAL LOW (ref 20–29)
Calcium: 9.9 mg/dL (ref 8.7–10.2)
Chloride: 95 mmol/L — ABNORMAL LOW (ref 96–106)
Creatinine, Ser: 0.81 mg/dL (ref 0.76–1.27)
Globulin, Total: 2.7 g/dL (ref 1.5–4.5)
Glucose: 368 mg/dL — ABNORMAL HIGH (ref 65–99)
Potassium: 4.7 mmol/L (ref 3.5–5.2)
Sodium: 133 mmol/L — ABNORMAL LOW (ref 134–144)
Total Protein: 7.2 g/dL (ref 6.0–8.5)
eGFR: 107 mL/min/{1.73_m2} (ref >59)

## 2020-10-11 LAB — LIPID PANEL W/O CHOL/HDL RATIO
Cholesterol, Total: 274 mg/dL — ABNORMAL HIGH (ref 100–199)
HDL: 43 mg/dL (ref >39)
LDL Chol Calc (NIH): 153 mg/dL — ABNORMAL HIGH (ref 0–99)
Triglycerides: 413 mg/dL — ABNORMAL HIGH (ref 0–149)
VLDL Cholesterol Cal: 78 mg/dL — ABNORMAL HIGH (ref 5–40)

## 2020-10-11 LAB — TSH: TSH: 1.52 u[IU]/mL (ref 0.450–4.500)

## 2020-10-11 LAB — PSA: Prostate Specific Ag, Serum: 0.9 ng/mL (ref 0.0–4.0)

## 2020-10-16 ENCOUNTER — Encounter: Payer: Self-pay | Admitting: *Deleted

## 2020-10-22 IMAGING — CR DG CHEST 2V
1 series · 2 of 2 positions shown · non-contrast
Comparison: 02/20/2018

CLINICAL DATA: Chest pain

EXAM:
CHEST - 2 VIEW

[Series 1: dg chest 2 view · 0.14mm/px · 2 of 2 slices shown]
[im 1/2]
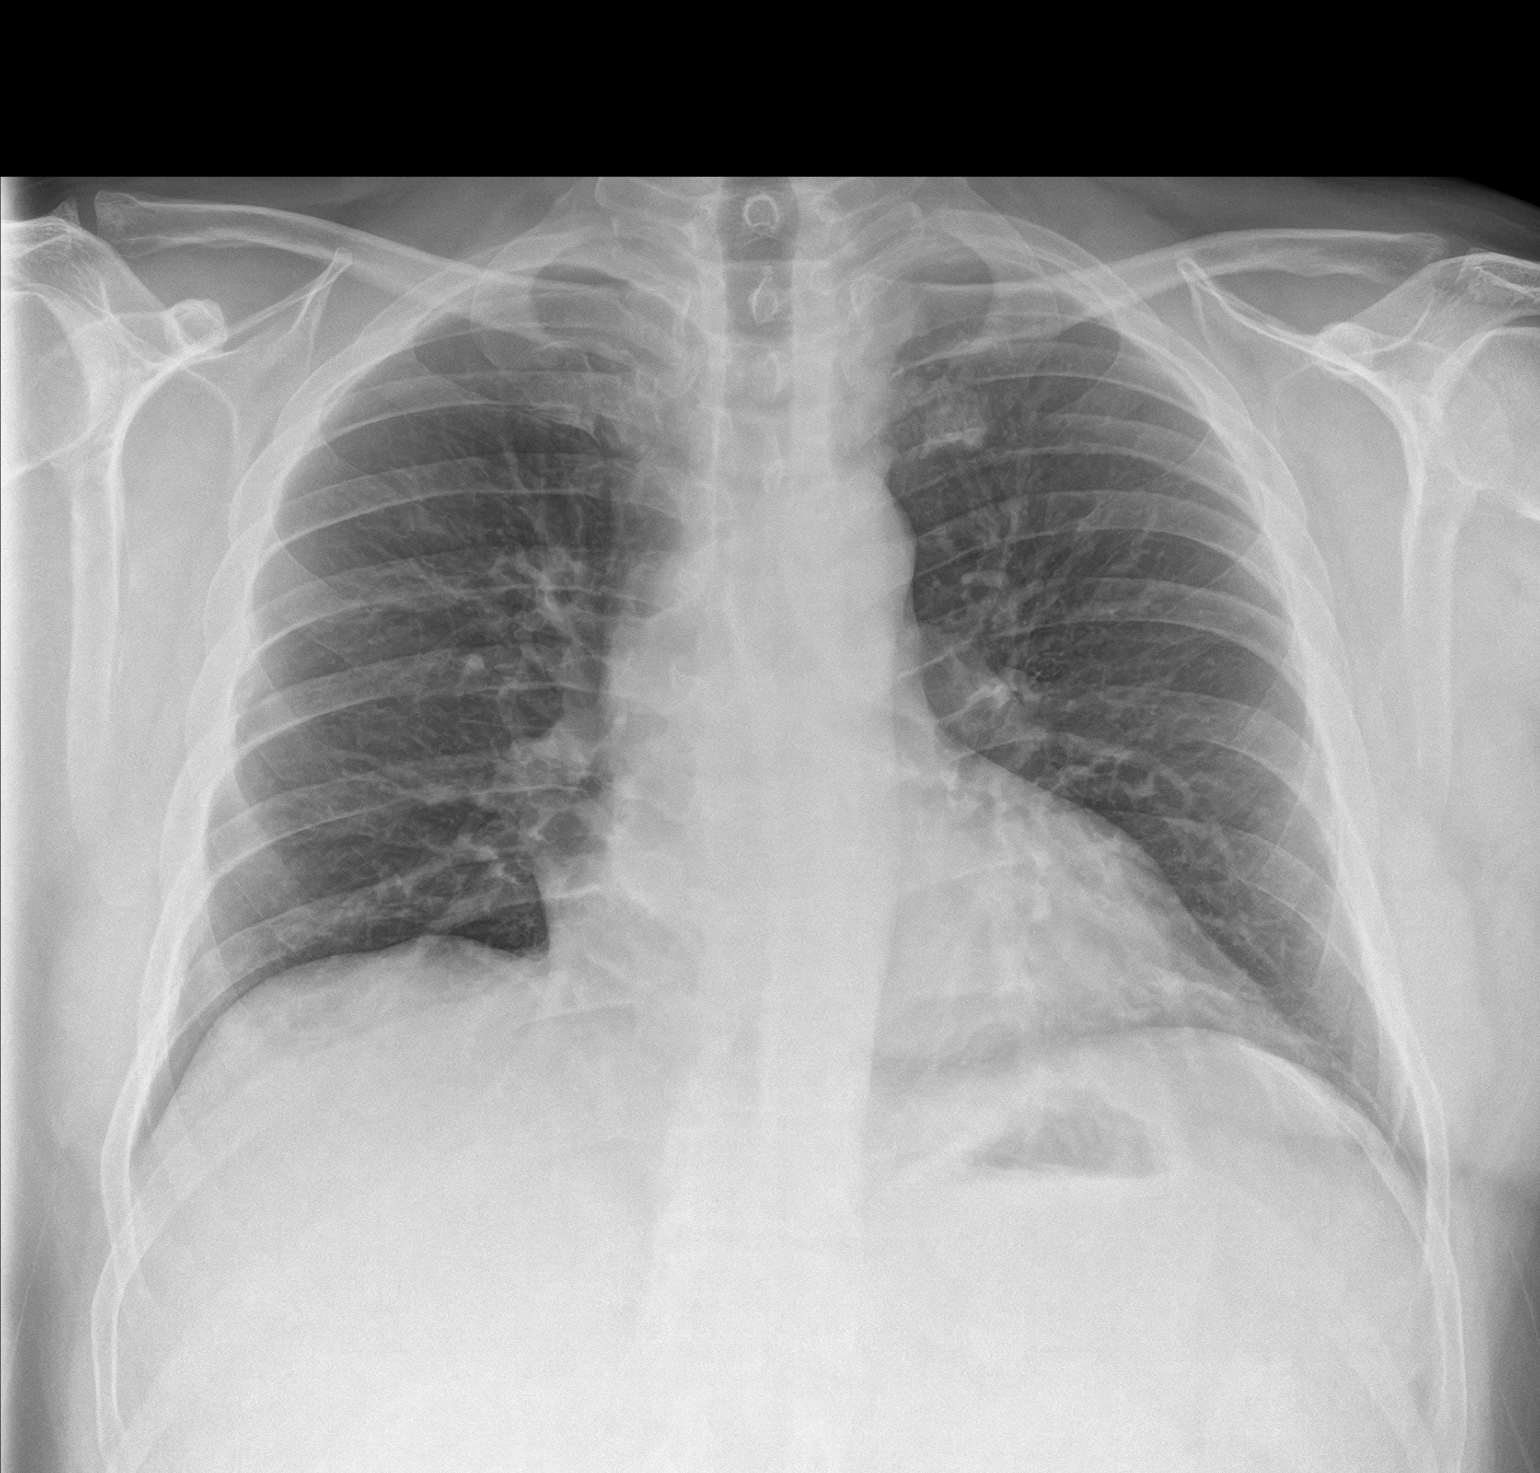
[im 2/2]
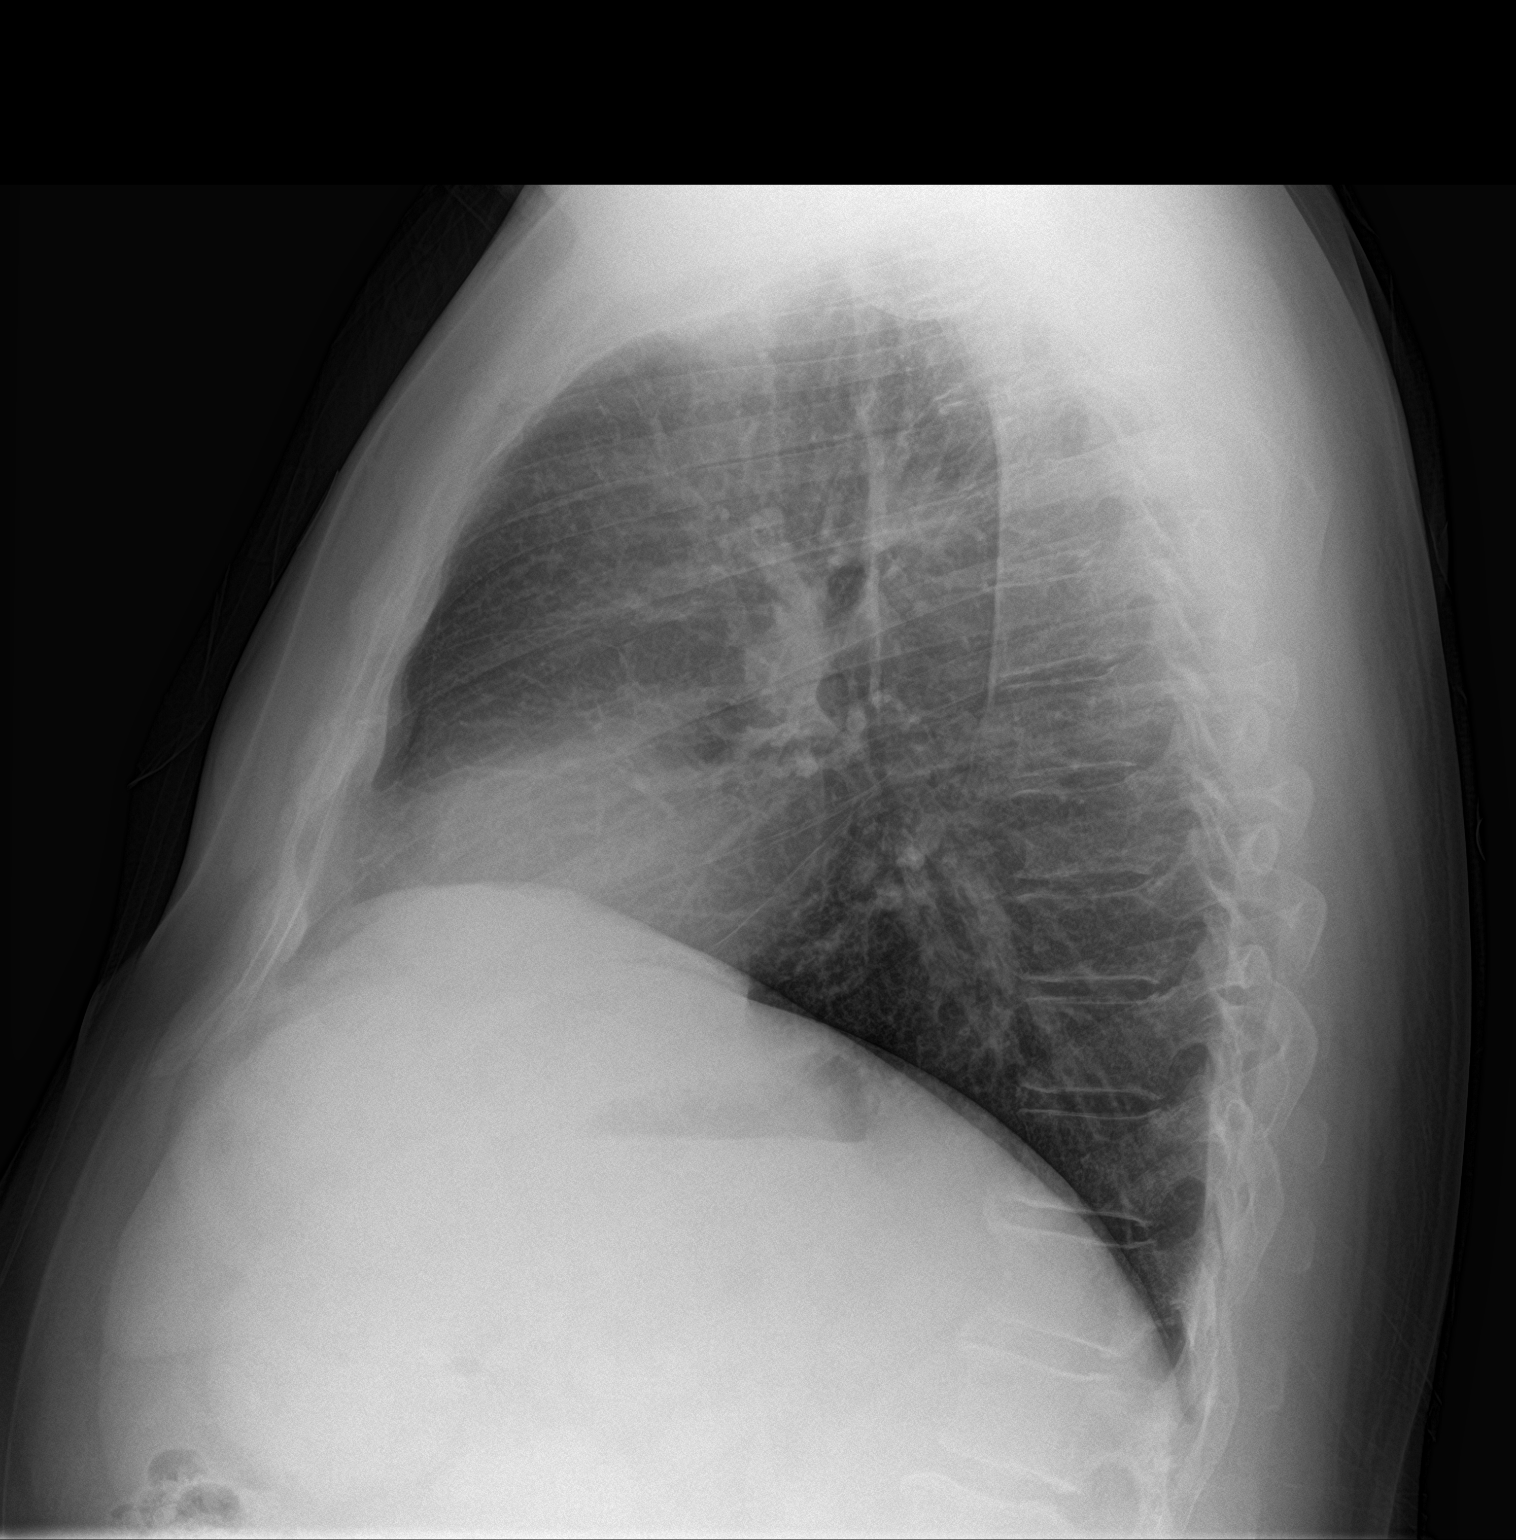

[2 of 2 positions shown; findings below may reference images not displayed]

FINDINGS: The heart size and mediastinal contours are within normal limits.
Both lungs are clear. The visualized skeletal structures are
unremarkable.
IMPRESSION: No active cardiopulmonary disease.

## 2020-11-11 ENCOUNTER — Encounter: Payer: Self-pay | Admitting: Family Medicine

## 2020-11-11 ENCOUNTER — Other Ambulatory Visit: Payer: Self-pay

## 2020-11-11 ENCOUNTER — Ambulatory Visit: Payer: No Typology Code available for payment source | Admitting: Family Medicine

## 2020-11-11 VITALS — BP 112/73 | HR 65 | Temp 97.7°F | Wt 227.4 lb

## 2020-11-11 DIAGNOSIS — E118 Type 2 diabetes mellitus with unspecified complications: Secondary | ICD-10-CM | POA: Diagnosis not present

## 2020-11-11 DIAGNOSIS — E1165 Type 2 diabetes mellitus with hyperglycemia: Secondary | ICD-10-CM

## 2020-11-11 DIAGNOSIS — IMO0002 Reserved for concepts with insufficient information to code with codable children: Secondary | ICD-10-CM

## 2020-11-11 NOTE — Assessment & Plan Note (Signed)
Tolerating his medicine well. Will continue current regimen. Recheck A1c in 2 months. Call with any concerns.

## 2020-11-11 NOTE — Progress Notes (Signed)
BP 112/73   Pulse 65   Temp 97.7 F (36.5 C) (Oral)   Wt 227 lb 6.4 oz (103.1 kg)   SpO2 97%   BMI 31.72 kg/m    Subjective:    Patient ID: Glenn Flynn, male    DOB: [birth year], 50 y.o.   MRN: [id number]  HPI: Glenn Flynn is a 50 y.o. male  Chief Complaint  Patient presents with   Diabetes    4 week f/up   DIABETES Hypoglycemic episodes:no Polydipsia/polyuria: no Visual disturbance: no Chest pain: no Paresthesias: no Glucose Monitoring: no  Accucheck frequency: Not Checking Taking Insulin?: no Blood Pressure Monitoring: not checking Retinal Examination: Not up to Date Foot Exam: Up to Date Diabetic Education: Completed Pneumovax: Up to Date Influenza: Up to Date Aspirin: no  Relevant past medical, surgical, family and social history reviewed and updated as indicated. Interim medical history since our last visit reviewed. Allergies and medications reviewed and updated.  Review of Systems  Constitutional: Negative.   Respiratory: Negative.    Gastrointestinal:  Positive for diarrhea. Negative for abdominal distention, abdominal pain, anal bleeding, blood in stool, constipation, nausea, rectal pain and vomiting.  Musculoskeletal: Negative.   Skin: Negative.   Psychiatric/Behavioral: Negative.     Per HPI unless specifically indicated above     Objective:    BP 112/73   Pulse 65   Temp 97.7 F (36.5 C) (Oral)   Wt 227 lb 6.4 oz (103.1 kg)   SpO2 97%   BMI 31.72 kg/m   Wt Readings from Last 3 Encounters:  11/11/20 227 lb 6.4 oz (103.1 kg)  10/10/20 235 lb 3.2 oz (106.7 kg)  12/27/19 242 lb (109.8 kg)    Physical Exam Vitals and nursing note reviewed.  Constitutional:      General: He is not in acute distress.    Appearance: Normal appearance. He is not ill-appearing, toxic-appearing or diaphoretic.  HENT:     Head: Normocephalic and atraumatic.     Right Ear: External ear normal.     Left Ear: External ear normal.     Nose: Nose  normal.     Mouth/Throat:     Mouth: Mucous membranes are moist.     Pharynx: Oropharynx is clear.  Eyes:     General: No scleral icterus.       Right eye: No discharge.        Left eye: No discharge.     Extraocular Movements: Extraocular movements intact.     Conjunctiva/sclera: Conjunctivae normal.     Pupils: Pupils are equal, round, and reactive to light.  Cardiovascular:     Rate and Rhythm: Normal rate and regular rhythm.     Pulses: Normal pulses.     Heart sounds: Normal heart sounds. No murmur heard.   No friction rub. No gallop.  Pulmonary:     Effort: Pulmonary effort is normal. No respiratory distress.     Breath sounds: Normal breath sounds. No stridor. No wheezing, rhonchi or rales.  Chest:     Chest wall: No tenderness.  Musculoskeletal:        General: Normal range of motion.     Cervical back: Normal range of motion and neck supple.  Skin:    General: Skin is warm and dry.     Capillary Refill: Capillary refill takes less than 2 seconds.     Coloration: Skin is not jaundiced or pale.     Findings: No bruising, erythema, lesion or  rash.  Neurological:     General: No focal deficit present.     Mental Status: He is alert and oriented to person, place, and time. Mental status is at baseline.  Psychiatric:        Mood and Affect: Mood normal.        Behavior: Behavior normal.        Thought Content: Thought content normal.        Judgment: Judgment normal.    Results for orders placed or performed in visit on 10/10/20  Bayer DCA Hb A1c Waived  Result Value Ref Range   HB A1C (BAYER DCA - WAIVED) 12.0 (H) <7.0 %  CBC with Differential/Platelet  Result Value Ref Range   WBC 7.2 3.4 - 10.8 x10E3/uL   RBC 5.47 4.14 - 5.80 x10E6/uL   Hemoglobin 16.3 13.0 - 17.7 g/dL   Hematocrit 47.9 37.5 - 51.0 %   MCV 88 79 - 97 fL   MCH 29.8 26.6 - 33.0 pg   MCHC 34.0 31.5 - 35.7 g/dL   RDW 12.3 11.6 - 15.4 %   Platelets 285 150 - 450 x10E3/uL   Neutrophils 55 Not  Estab. %   Lymphs 33 Not Estab. %   Monocytes 8 Not Estab. %   Eos 4 Not Estab. %   Basos 0 Not Estab. %   Neutrophils Absolute 3.9 1.4 - 7.0 x10E3/uL   Lymphocytes Absolute 2.4 0.7 - 3.1 x10E3/uL   Monocytes Absolute 0.6 0.1 - 0.9 x10E3/uL   EOS (ABSOLUTE) 0.3 0.0 - 0.4 x10E3/uL   Basophils Absolute 0.0 0.0 - 0.2 x10E3/uL   Immature Granulocytes 0 Not Estab. %   Immature Grans (Abs) 0.0 0.0 - 0.1 x10E3/uL  Comprehensive metabolic panel  Result Value Ref Range   Glucose 368 (H) 65 - 99 mg/dL   BUN 13 6 - 24 mg/dL   Creatinine, Ser 0.81 0.76 - 1.27 mg/dL   eGFR 107 >59 mL/min/1.73   BUN/Creatinine Ratio 16 9 - 20   Sodium 133 (L) 134 - 144 mmol/L   Potassium 4.7 3.5 - 5.2 mmol/L   Chloride 95 (L) 96 - 106 mmol/L   CO2 19 (L) 20 - 29 mmol/L   Calcium 9.9 8.7 - 10.2 mg/dL   Total Protein 7.2 6.0 - 8.5 g/dL   Albumin 4.5 3.8 - 4.9 g/dL   Globulin, Total 2.7 1.5 - 4.5 g/dL   Albumin/Globulin Ratio 1.7 1.2 - 2.2   Bilirubin Total 0.5 0.0 - 1.2 mg/dL   Alkaline Phosphatase 89 44 - 121 IU/L   AST 11 0 - 40 IU/L   ALT 25 0 - 44 IU/L  Lipid Panel w/o Chol/HDL Ratio  Result Value Ref Range   Cholesterol, Total 274 (H) 100 - 199 mg/dL   Triglycerides 413 (H) 0 - 149 mg/dL   HDL 43 >39 mg/dL   VLDL Cholesterol Cal 78 (H) 5 - 40 mg/dL   LDL Chol Calc (NIH) 153 (H) 0 - 99 mg/dL  Microalbumin, Urine Waived  Result Value Ref Range   Microalb, Ur Waived 10 0 - 19 mg/L   Creatinine, Urine Waived 50 10 - 300 mg/dL   Microalb/Creat Ratio 30-300 (H) <30 mg/g  TSH  Result Value Ref Range   TSH 1.520 0.450 - 4.500 uIU/mL  Urinalysis, Routine w reflex microscopic  Result Value Ref Range   Specific Gravity, UA 1.010 1.005 - 1.030   pH, UA 5.0 5.0 - 7.5   Color, UA Yellow Yellow  Appearance Ur Clear Clear   Leukocytes,UA Negative Negative   Protein,UA Negative Negative/Trace   Glucose, UA 3+ (A) Negative   Ketones, UA Trace (A) Negative   RBC, UA Negative Negative   Bilirubin, UA  Negative Negative   Urobilinogen, Ur 0.2 0.2 - 1.0 mg/dL   Nitrite, UA Negative Negative  PSA  Result Value Ref Range   Prostate Specific Ag, Serum 0.9 0.0 - 4.0 ng/mL      Assessment & Plan:   Problem List Items Addressed This Visit       Endocrine   Diabetes mellitus type 2 with complications, uncontrolled ([place]) - Primary    Tolerating his medicine well. Will continue current regimen. Recheck A1c in 2 months. Call with any concerns.        Relevant Orders   Comprehensive metabolic panel     Follow up plan: Return in about 2 months (around 01/12/2021).

## 2020-11-12 LAB — COMPREHENSIVE METABOLIC PANEL
ALT: 32 IU/L (ref 0–44)
AST: 20 IU/L (ref 0–40)
Albumin/Globulin Ratio: 2 (ref 1.2–2.2)
Albumin: 4.5 g/dL (ref 3.8–4.9)
Alkaline Phosphatase: 88 IU/L (ref 44–121)
BUN/Creatinine Ratio: 15 (ref 9–20)
BUN: 13 mg/dL (ref 6–24)
Bilirubin Total: 1.1 mg/dL (ref 0.0–1.2)
CO2: 25 mmol/L (ref 20–29)
Calcium: 9.7 mg/dL (ref 8.7–10.2)
Chloride: 98 mmol/L (ref 96–106)
Creatinine, Ser: 0.88 mg/dL (ref 0.76–1.27)
Globulin, Total: 2.3 g/dL (ref 1.5–4.5)
Glucose: 264 mg/dL — ABNORMAL HIGH (ref 65–99)
Potassium: 4.9 mmol/L (ref 3.5–5.2)
Sodium: 137 mmol/L (ref 134–144)
Total Protein: 6.8 g/dL (ref 6.0–8.5)
eGFR: 104 mL/min/{1.73_m2} (ref >59)

## 2021-01-13 ENCOUNTER — Other Ambulatory Visit: Payer: Self-pay

## 2021-01-13 ENCOUNTER — Ambulatory Visit: Payer: No Typology Code available for payment source | Admitting: Family Medicine

## 2021-01-13 ENCOUNTER — Encounter: Payer: Self-pay | Admitting: Family Medicine

## 2021-01-13 VITALS — BP 129/85 | HR 57 | Ht 71.0 in | Wt 224.0 lb

## 2021-01-13 DIAGNOSIS — IMO0002 Reserved for concepts with insufficient information to code with codable children: Secondary | ICD-10-CM

## 2021-01-13 DIAGNOSIS — Z23 Encounter for immunization: Secondary | ICD-10-CM | POA: Diagnosis not present

## 2021-01-13 DIAGNOSIS — E118 Type 2 diabetes mellitus with unspecified complications: Secondary | ICD-10-CM

## 2021-01-13 DIAGNOSIS — E1165 Type 2 diabetes mellitus with hyperglycemia: Secondary | ICD-10-CM

## 2021-01-13 LAB — BAYER DCA HB A1C WAIVED: HB A1C (BAYER DCA - WAIVED): 11.1 % — ABNORMAL HIGH (ref 4.8–5.6)

## 2021-01-13 MED ORDER — RYBELSUS 14 MG PO TABS: 14.0000 mg | ORAL_TABLET | Freq: Every day | ORAL | 1 refills | Status: DC

## 2021-01-13 MED ORDER — RYBELSUS 3 MG PO TABS: 3.0000 mg | ORAL_TABLET | Freq: Every day | ORAL | 0 refills | Status: DC

## 2021-01-13 MED ORDER — ALPRAZOLAM 0.5 MG PO TABS: 0.5000 mg | ORAL_TABLET | Freq: Two times a day (BID) | ORAL | 0 refills | Status: DC | PRN

## 2021-01-13 MED ORDER — RYBELSUS 7 MG PO TABS: 7.0000 mg | ORAL_TABLET | Freq: Every day | ORAL | 0 refills | Status: DC

## 2021-01-13 NOTE — Progress Notes (Signed)
BP 129/85   Pulse (!) 57   Ht 5' 11"  (1.803 m)   Wt 224 lb (101.6 kg)   BMI 31.24 kg/m    Subjective:    Patient ID: Glenn Flynn, male    DOB: 1970/12/09, 50 y.o.   MRN: [id number]  HPI: Glenn Flynn is a 50 y.o. male  Chief Complaint  Patient presents with   Diabetes   DIABETES Hypoglycemic episodes:no Polydipsia/polyuria: no Visual disturbance: no Chest pain: no Paresthesias: no Glucose Monitoring: no  Accucheck frequency: Not Checking Taking Insulin?: no Blood Pressure Monitoring: not checking Retinal Examination: not Up to Date Foot Exam: Up to Date Diabetic Education: Completed Pneumovax: Up to Date Influenza: Up to Date Aspirin: yes  Relevant past medical, surgical, family and social history reviewed and updated as indicated. Interim medical history since our last visit reviewed. Allergies and medications reviewed and updated.  Review of Systems  Constitutional: Negative.   Respiratory: Negative.    Cardiovascular: Negative.   Gastrointestinal: Negative.   Musculoskeletal: Negative.   Psychiatric/Behavioral: Negative.     Per HPI unless specifically indicated above     Objective:    BP 129/85   Pulse (!) 57   Ht 5' 11"  (1.803 m)   Wt 224 lb (101.6 kg)   BMI 31.24 kg/m   Wt Readings from Last 3 Encounters:  01/13/21 224 lb (101.6 kg)  11/11/20 227 lb 6.4 oz (103.1 kg)  10/10/20 235 lb 3.2 oz (106.7 kg)    Physical Exam Vitals and nursing note reviewed.  Constitutional:      General: He is not in acute distress.    Appearance: Normal appearance. He is not ill-appearing, toxic-appearing or diaphoretic.  HENT:     Head: Normocephalic and atraumatic.     Right Ear: External ear normal.     Left Ear: External ear normal.     Nose: Nose normal.     Mouth/Throat:     Mouth: Mucous membranes are moist.     Pharynx: Oropharynx is clear.  Eyes:     General: No scleral icterus.       Right eye: No discharge.        Left eye: No  discharge.     Extraocular Movements: Extraocular movements intact.     Conjunctiva/sclera: Conjunctivae normal.     Pupils: Pupils are equal, round, and reactive to light.  Cardiovascular:     Rate and Rhythm: Normal rate and regular rhythm.     Pulses: Normal pulses.     Heart sounds: Normal heart sounds. No murmur heard.   No friction rub. No gallop.  Pulmonary:     Effort: Pulmonary effort is normal. No respiratory distress.     Breath sounds: Normal breath sounds. No stridor. No wheezing, rhonchi or rales.  Chest:     Chest wall: No tenderness.  Musculoskeletal:        General: Normal range of motion.     Cervical back: Normal range of motion and neck supple.  Skin:    General: Skin is warm and dry.     Capillary Refill: Capillary refill takes less than 2 seconds.     Coloration: Skin is not jaundiced or pale.     Findings: No bruising, erythema, lesion or rash.  Neurological:     General: No focal deficit present.     Mental Status: He is alert and oriented to person, place, and time. Mental status is at baseline.  Psychiatric:  Mood and Affect: Mood normal.        Behavior: Behavior normal.        Thought Content: Thought content normal.        Judgment: Judgment normal.    Results for orders placed or performed in visit on 11/11/20  Comprehensive metabolic panel  Result Value Ref Range   Glucose 264 (H) 65 - 99 mg/dL   BUN 13 6 - 24 mg/dL   Creatinine, Ser 0.88 0.76 - 1.27 mg/dL   eGFR 104 >59 mL/min/1.73   BUN/Creatinine Ratio 15 9 - 20   Sodium 137 134 - 144 mmol/L   Potassium 4.9 3.5 - 5.2 mmol/L   Chloride 98 96 - 106 mmol/L   CO2 25 20 - 29 mmol/L   Calcium 9.7 8.7 - 10.2 mg/dL   Total Protein 6.8 6.0 - 8.5 g/dL   Albumin 4.5 3.8 - 4.9 g/dL   Globulin, Total 2.3 1.5 - 4.5 g/dL   Albumin/Globulin Ratio 2.0 1.2 - 2.2   Bilirubin Total 1.1 0.0 - 1.2 mg/dL   Alkaline Phosphatase 88 44 - 121 IU/L   AST 20 0 - 40 IU/L   ALT 32 0 - 44 IU/L       Assessment & Plan:   Problem List Items Addressed This Visit       Endocrine   Diabetes mellitus type 2 with complications, uncontrolled ([place]) - Primary    Not under good control with A1c of 11.1- will add rybelsus and then increase up. Continue metformin. Recheck 3 months. Call with any concerns.       Relevant Medications   Semaglutide (RYBELSUS) 3 MG TABS   Semaglutide (RYBELSUS) 7 MG TABS (Start on 02/12/2021)   Semaglutide (RYBELSUS) 14 MG TABS (Start on 03/14/2021)   Other Relevant Orders   Bayer DCA Hb A1c Waived   Other Visit Diagnoses     Needs flu shot       Relevant Orders   Flu Vaccine QUAD 6+ mos PF IM (Fluarix Quad PF) (Completed)        Follow up plan: Return in about 3 months (around 04/14/2021).

## 2021-01-13 NOTE — Assessment & Plan Note (Signed)
Not under good control with A1c of 11.1- will add rybelsus and then increase up. Continue metformin. Recheck 3 months. Call with any concerns.

## 2021-01-13 NOTE — Patient Instructions (Signed)
Call to schedule your Colonoscopy: [phone].

## 2021-04-15 ENCOUNTER — Ambulatory Visit: Payer: No Typology Code available for payment source | Admitting: Family Medicine

## 2021-05-16 ENCOUNTER — Other Ambulatory Visit: Payer: Self-pay | Admitting: Family Medicine

## 2021-05-16 NOTE — Telephone Encounter (Signed)
Requested Prescriptions  Pending Prescriptions Disp Refills   traZODone (DESYREL) 100 MG tablet [Pharmacy Med Name: TRAZODONE 100 MG TABLET] 90 tablet 1    Sig: TAKE 1 TABLET BY MOUTH EVERYDAY AT BEDTIME     Psychiatry: Antidepressants - Serotonin Modulator Passed - 05/16/2021  1:24 AM      Passed - Valid encounter within last 6 months    Recent Outpatient Visits          4 months ago Diabetes mellitus type 2 with complications, uncontrolled ([place])   [place], Megan P, DO   6 months ago Diabetes mellitus type 2 with complications, uncontrolled ([place])   [place], Megan P, DO   7 months ago Routine general medical examination at a health care facility   [facility], [place] P, DO   11 months ago Suspected COVID-19 virus infection   [facility], [place], DO   1 year ago Diabetes mellitus type 2 with complications, uncontrolled ([place])   [place], [place], DO      Future Appointments            In 4 days Wynetta Emery, Barb Merino, DO [employer]

## 2021-05-20 ENCOUNTER — Encounter: Payer: Self-pay | Admitting: Family Medicine

## 2021-05-20 ENCOUNTER — Ambulatory Visit: Payer: No Typology Code available for payment source | Admitting: Family Medicine

## 2021-05-20 ENCOUNTER — Other Ambulatory Visit: Payer: Self-pay

## 2021-05-20 VITALS — BP 115/71 | HR 61 | Temp 97.8°F | Wt 219.0 lb

## 2021-05-20 DIAGNOSIS — Z1211 Encounter for screening for malignant neoplasm of colon: Secondary | ICD-10-CM | POA: Diagnosis not present

## 2021-05-20 DIAGNOSIS — E1165 Type 2 diabetes mellitus with hyperglycemia: Secondary | ICD-10-CM

## 2021-05-20 DIAGNOSIS — E782 Mixed hyperlipidemia: Secondary | ICD-10-CM | POA: Diagnosis not present

## 2021-05-20 DIAGNOSIS — F419 Anxiety disorder, unspecified: Secondary | ICD-10-CM

## 2021-05-20 LAB — MICROALBUMIN, URINE WAIVED
Creatinine, Urine Waived: 100 mg/dL (ref 10–300)
Microalb, Ur Waived: 30 mg/L — ABNORMAL HIGH (ref 0–19)
Microalb/Creat Ratio: <30 mg/g (ref <30)

## 2021-05-20 LAB — BAYER DCA HB A1C WAIVED: HB A1C (BAYER DCA - WAIVED): 12 % — ABNORMAL HIGH (ref 4.8–5.6)

## 2021-05-20 MED ORDER — ATORVASTATIN CALCIUM 80 MG PO TABS: 80.0000 mg | ORAL_TABLET | Freq: Every day | ORAL | 1 refills | Status: DC

## 2021-05-20 MED ORDER — TRAZODONE HCL 100 MG PO TABS: ORAL_TABLET | ORAL | 1 refills | Status: DC

## 2021-05-20 MED ORDER — RYBELSUS 7 MG PO TABS: 7.0000 mg | ORAL_TABLET | Freq: Every day | ORAL | 0 refills | Status: AC

## 2021-05-20 MED ORDER — ALPRAZOLAM 0.5 MG PO TABS: 0.5000 mg | ORAL_TABLET | Freq: Two times a day (BID) | ORAL | 0 refills | Status: DC | PRN

## 2021-05-20 MED ORDER — RYBELSUS 14 MG PO TABS: 14.0000 mg | ORAL_TABLET | Freq: Every day | ORAL | 1 refills | Status: DC

## 2021-05-20 MED ORDER — METFORMIN HCL ER 500 MG PO TB24: ORAL_TABLET | ORAL | 1 refills | Status: DC

## 2021-05-20 MED ORDER — ESCITALOPRAM OXALATE 20 MG PO TABS: ORAL_TABLET | ORAL | 1 refills | Status: DC

## 2021-05-20 MED ORDER — BUPROPION HCL ER (XL) 150 MG PO TB24: 150.0000 mg | ORAL_TABLET | Freq: Every day | ORAL | 1 refills | Status: DC

## 2021-05-20 MED ORDER — RYBELSUS 3 MG PO TABS: 3.0000 mg | ORAL_TABLET | Freq: Every day | ORAL | 0 refills | Status: DC

## 2021-05-20 NOTE — Assessment & Plan Note (Signed)
Under good control on current regimen. Continue current regimen. Continue to monitor. Call with any concerns. Refills given for 3 months. Follow up 3 months.    

## 2021-05-20 NOTE — Assessment & Plan Note (Signed)
Under good control on current regimen. Continue current regimen. Continue to monitor. Call with any concerns. Refills given. Labs drawn today.   

## 2021-05-20 NOTE — Progress Notes (Signed)
BP 115/71    Pulse 61    Temp 97.8 F (36.6 C)    Wt 219 lb (99.3 kg)    SpO2 97%    BMI 30.54 kg/m    Subjective:    Patient ID: Glenn Flynn, male    DOB: [birth year], 51 y.o.   MRN: [id number]  HPI: Glenn Flynn is a 51 y.o. male  Chief Complaint  Patient presents with   Diabetes    Patient had eye exam at [place] Hypoglycemic episodes:no Polydipsia/polyuria: no Visual disturbance: no Chest pain: no Paresthesias: no Glucose Monitoring: no  Accucheck frequency: Not Checking Taking Insulin?: no Blood Pressure Monitoring: not checking Retinal Examination: Up to Date Foot Exam: Up to Date Diabetic Education: Completed Pneumovax: Up to Date Influenza: Up to Date Aspirin: no  HYPERLIPIDEMIA Hyperlipidemia status: excellent compliance Satisfied with current treatment?  yes Side effects:  no Medication compliance: excellent compliance Past cholesterol meds: atorvastatin Supplements: none Aspirin:  no The ASCVD Risk score (Arnett DK, et al., 2019) failed to calculate for the following reasons:   The patient has a prior MI or stroke diagnosis Chest pain:  no Coronary artery disease:  no  ANXIETY Duration: chronic Status:stable Anxious mood: yes  Excessive worrying: yes Irritability: no  Sweating: no Nausea: no Palpitations:no Hyperventilation: no Panic attacks: no Agoraphobia: no  Obscessions/compulsions: no Depressed mood: no Depression screen [facility] 2/9 05/20/2021 10/10/2020 08/18/2019 05/26/2019 02/21/2019  Decreased Interest 0 1 0 0 0  Down, Depressed, Hopeless 1 1 1 1  0  PHQ - 2 Score 1 2 1 1  0  Altered sleeping 1 0 0 0 3  Tired, decreased energy 1 0 1 1 1   Change in appetite 0 0 0 0 0  Feeling bad or failure about yourself  1 1 0 0 0  Trouble concentrating 0 0 0 0 0  Moving slowly or fidgety/restless 0 0 0 0 0  Suicidal thoughts 0 0 0 0 0  PHQ-9 Score 4 3 2 2 4   Difficult doing work/chores - Not difficult at all Not difficult  at all Not difficult at all Not difficult at all  Some recent data might be hidden   Anhedonia: no Weight changes: no Insomnia: no   Hypersomnia: no Fatigue/loss of energy: no Feelings of worthlessness: no Feelings of guilt: no Impaired concentration/indecisiveness: no Suicidal ideations: no  Crying spells: no Recent Stressors/Life Changes: no   Relationship problems: no   Family stress: no     Financial stress: no    Job stress: no    Recent death/loss: no   Relevant past medical, surgical, family and social history reviewed and updated as indicated. Interim medical history since our last visit reviewed. Allergies and medications reviewed and updated.  Review of Systems  Constitutional: Negative.   Respiratory: Negative.    Cardiovascular: Negative.   Gastrointestinal: Negative.   Musculoskeletal: Negative.   Neurological: Negative.   Psychiatric/Behavioral: Negative.     Per HPI unless specifically indicated above     Objective:    BP 115/71    Pulse 61    Temp 97.8 F (36.6 C)    Wt 219 lb (99.3 kg)    SpO2 97%    BMI 30.54 kg/m   Wt Readings from Last 3 Encounters:  05/20/21 219 lb (99.3 kg)  01/13/21 224 lb (101.6 kg)  11/11/20 227 lb 6.4 oz (103.1 kg)    Physical Exam Vitals and nursing note reviewed.  Constitutional:      General: He is not in acute distress.    Appearance: Normal appearance. He is not ill-appearing, toxic-appearing or diaphoretic.  HENT:     Head: Normocephalic and atraumatic.     Right Ear: External ear normal.     Left Ear: External ear normal.     Nose: Nose normal.     Mouth/Throat:     Mouth: Mucous membranes are moist.     Pharynx: Oropharynx is clear.  Eyes:     General: No scleral icterus.       Right eye: No discharge.        Left eye: No discharge.     Extraocular Movements: Extraocular movements intact.     Conjunctiva/sclera: Conjunctivae normal.     Pupils: Pupils are equal, round, and reactive to light.   Cardiovascular:     Rate and Rhythm: Normal rate and regular rhythm.     Pulses: Normal pulses.     Heart sounds: Normal heart sounds. No murmur heard.   No friction rub. No gallop.  Pulmonary:     Effort: Pulmonary effort is normal. No respiratory distress.     Breath sounds: Normal breath sounds. No stridor. No wheezing, rhonchi or rales.  Chest:     Chest wall: No tenderness.  Musculoskeletal:        General: Normal range of motion.     Cervical back: Normal range of motion and neck supple.  Skin:    General: Skin is warm and dry.     Capillary Refill: Capillary refill takes less than 2 seconds.     Coloration: Skin is not jaundiced or pale.     Findings: No bruising, erythema, lesion or rash.  Neurological:     General: No focal deficit present.     Mental Status: He is alert and oriented to person, place, and time. Mental status is at baseline.  Psychiatric:        Mood and Affect: Mood normal.        Behavior: Behavior normal.        Thought Content: Thought content normal.        Judgment: Judgment normal.    Results for orders placed or performed in visit on 01/13/21  Bayer DCA Hb A1c Waived  Result Value Ref Range   HB A1C (BAYER DCA - WAIVED) 11.1 (H) 4.8 - 5.6 %      Assessment & Plan:   Problem List Items Addressed This Visit       Endocrine   Type 2 diabetes mellitus with hyperglycemia ([place]) - Primary    Not under good control with A1c of 12. Will restart rybelsus and recheck 3 months. Call with any concerns. Concern about cost- referral to CCM made today.      Relevant Medications   atorvastatin (LIPITOR) 80 MG tablet   metFORMIN (GLUCOPHAGE-XR) 500 MG 24 hr tablet   Semaglutide (RYBELSUS) 14 MG TABS (Start on 07/19/2021)   Semaglutide (RYBELSUS) 3 MG TABS   Semaglutide (RYBELSUS) 7 MG TABS (Start on 06/19/2021)   Other Relevant Orders   Bayer DCA Hb A1c Waived   Comprehensive metabolic panel   Lipid Panel w/o Chol/HDL Ratio   Microalbumin, Urine  Waived   AMB Referral to [place]     Other   Hyperlipidemia    Under good control on current regimen. Continue current regimen. Continue to monitor. Call with any concerns. Refills given. Labs drawn today.  Relevant Medications   atorvastatin (LIPITOR) 80 MG tablet   Other Relevant Orders   Comprehensive metabolic panel   Lipid Panel w/o Chol/HDL Ratio   Anxiety    Under good control on current regimen. Continue current regimen. Continue to monitor. Call with any concerns. Refills given for 3 months. Follow up 3 months.        Relevant Medications   escitalopram (LEXAPRO) 20 MG tablet   buPROPion (WELLBUTRIN XL) 150 MG 24 hr tablet   ALPRAZolam (XANAX) 0.5 MG tablet   traZODone (DESYREL) 100 MG tablet   Other Visit Diagnoses     Screening for colon cancer       Referral to GI made today.   Relevant Orders   Ambulatory referral to Gastroenterology        Follow up plan: Return in about 3 months (around 08/17/2021).

## 2021-05-20 NOTE — Assessment & Plan Note (Signed)
Not under good control with A1c of 12. Will restart rybelsus and recheck 3 months. Call with any concerns. Concern about cost- referral to CCM made today.

## 2021-05-21 ENCOUNTER — Other Ambulatory Visit: Payer: Self-pay

## 2021-05-21 DIAGNOSIS — Z1211 Encounter for screening for malignant neoplasm of colon: Secondary | ICD-10-CM

## 2021-05-21 LAB — COMPREHENSIVE METABOLIC PANEL
ALT: 25 IU/L (ref 0–44)
AST: 10 IU/L (ref 0–40)
Albumin/Globulin Ratio: 2 (ref 1.2–2.2)
Albumin: 4.7 g/dL (ref 3.8–4.9)
Alkaline Phosphatase: 87 IU/L (ref 44–121)
BUN/Creatinine Ratio: 16 (ref 9–20)
BUN: 15 mg/dL (ref 6–24)
Bilirubin Total: 0.8 mg/dL (ref 0.0–1.2)
CO2: 23 mmol/L (ref 20–29)
Calcium: 10.2 mg/dL (ref 8.7–10.2)
Chloride: 98 mmol/L (ref 96–106)
Creatinine, Ser: 0.92 mg/dL (ref 0.76–1.27)
Globulin, Total: 2.3 g/dL (ref 1.5–4.5)
Glucose: 340 mg/dL — ABNORMAL HIGH (ref 70–99)
Potassium: 5.2 mmol/L (ref 3.5–5.2)
Sodium: 139 mmol/L (ref 134–144)
Total Protein: 7 g/dL (ref 6.0–8.5)
eGFR: 100 mL/min/{1.73_m2} (ref >59)

## 2021-05-21 LAB — LIPID PANEL W/O CHOL/HDL RATIO
Cholesterol, Total: 223 mg/dL — ABNORMAL HIGH (ref 100–199)
HDL: 46 mg/dL (ref >39)
LDL Chol Calc (NIH): 137 mg/dL — ABNORMAL HIGH (ref 0–99)
Triglycerides: 224 mg/dL — ABNORMAL HIGH (ref 0–149)
VLDL Cholesterol Cal: 40 mg/dL (ref 5–40)

## 2021-05-21 MED ORDER — PEG 3350-KCL-NA BICARB-NACL 420 G PO SOLR: 4000.0000 mL | Freq: Once | ORAL | 0 refills | Status: AC

## 2021-05-21 NOTE — Progress Notes (Signed)
Gastroenterology Pre-Procedure Review  Request Date: 06/10/2021 Requesting Physician: Dr. Vicente Males  PATIENT REVIEW QUESTIONS: The patient responded to the following health history questions as indicated:    1. Are you having any GI issues? no 2. Do you have a personal history of Polyps? no 3. Do you have a family history of Colon Cancer or Polyps? no 4. Diabetes Mellitus? yes (Type II) 5. Joint replacements in the past 12 months?no 6. Major health problems in the past 3 months?no 7. Any artificial heart valves, MVP, or defibrillator?no    MEDICATIONS & ALLERGIES:    Patient reports the following regarding taking any anticoagulation/antiplatelet therapy:   Plavix, Coumadin, Eliquis, Xarelto, Lovenox, Pradaxa, Brilinta, or Effient? no Aspirin? no  Patient confirms/reports the following medications:  Current Outpatient Medications  Medication Sig Dispense Refill   ALPRAZolam (XANAX) 0.5 MG tablet Take 1 tablet (0.5 mg total) by mouth 2 (two) times daily as needed for anxiety. 30 tablet 0   atorvastatin (LIPITOR) 80 MG tablet Take 1 tablet (80 mg total) by mouth daily at 6 PM. 90 tablet 1   buPROPion (WELLBUTRIN XL) 150 MG 24 hr tablet Take 1 tablet (150 mg total) by mouth daily. 90 tablet 1   escitalopram (LEXAPRO) 20 MG tablet Take 0.5 tablets (10 mg total) by mouth at bedtime for 7 days, THEN 1 tablet (20 mg total) at bedtime. 90 tablet 1   metFORMIN (GLUCOPHAGE-XR) 500 MG 24 hr tablet TAKE 2 TABLETS TWO TIMES A DAY 360 tablet 1   Multiple Vitamins-Minerals (MULTIVITAMIN WITH MINERALS) tablet Take 1 tablet by mouth daily.     [START ON 07/19/2021] Semaglutide (RYBELSUS) 14 MG TABS Take 14 mg by mouth daily. 90 tablet 1   Semaglutide (RYBELSUS) 3 MG TABS Take 3 mg by mouth daily. 30 tablet 0   [START ON 06/19/2021] Semaglutide (RYBELSUS) 7 MG TABS Take 7 mg by mouth daily. 30 tablet 0   traZODone (DESYREL) 100 MG tablet TAKE 1 TABLET BY MOUTH EVERYDAY AT BEDTIME 90 tablet 1   No current  facility-administered medications for this visit.    Patient confirms/reports the following allergies:  No Known Allergies  No orders of the defined types were placed in this encounter.   AUTHORIZATION INFORMATION Primary Insurance: 1D#: Group #:  Secondary Insurance: 1D#: Group #:  SCHEDULE INFORMATION: Date: 06/10/2021 Time: Location: [facility]

## 2021-06-10 ENCOUNTER — Encounter: Payer: Self-pay | Admitting: Gastroenterology

## 2021-06-10 ENCOUNTER — Encounter
Admission: RE | Disposition: A | Discharge status: Home / Self Care | Payer: Self-pay | Source: Ambulatory Visit | Attending: Gastroenterology

## 2021-06-10 ENCOUNTER — Ambulatory Visit: Payer: PRIVATE HEALTH INSURANCE | Admitting: Anesthesiology

## 2021-06-10 ENCOUNTER — Other Ambulatory Visit: Payer: Self-pay

## 2021-06-10 ENCOUNTER — Ambulatory Visit
Admission: RE | Admit: 2021-06-10 | Discharge: 2021-06-10 | Disposition: A | Discharge status: Home / Self Care | Payer: PRIVATE HEALTH INSURANCE | Source: Ambulatory Visit | Attending: Gastroenterology | Admitting: Gastroenterology

## 2021-06-10 DIAGNOSIS — T185XXA Foreign body in anus and rectum, initial encounter: Secondary | ICD-10-CM | POA: Insufficient documentation

## 2021-06-10 DIAGNOSIS — D123 Benign neoplasm of transverse colon: Secondary | ICD-10-CM | POA: Diagnosis not present

## 2021-06-10 DIAGNOSIS — F419 Anxiety disorder, unspecified: Secondary | ICD-10-CM | POA: Insufficient documentation

## 2021-06-10 DIAGNOSIS — F32A Depression, unspecified: Secondary | ICD-10-CM | POA: Insufficient documentation

## 2021-06-10 DIAGNOSIS — X58XXXA Exposure to other specified factors, initial encounter: Secondary | ICD-10-CM | POA: Insufficient documentation

## 2021-06-10 DIAGNOSIS — E785 Hyperlipidemia, unspecified: Secondary | ICD-10-CM | POA: Diagnosis not present

## 2021-06-10 DIAGNOSIS — Z7984 Long term (current) use of oral hypoglycemic drugs: Secondary | ICD-10-CM | POA: Insufficient documentation

## 2021-06-10 DIAGNOSIS — K635 Polyp of colon: Secondary | ICD-10-CM | POA: Diagnosis not present

## 2021-06-10 DIAGNOSIS — Z1211 Encounter for screening for malignant neoplasm of colon: Secondary | ICD-10-CM | POA: Diagnosis not present

## 2021-06-10 DIAGNOSIS — E119 Type 2 diabetes mellitus without complications: Secondary | ICD-10-CM | POA: Diagnosis not present

## 2021-06-10 DIAGNOSIS — I252 Old myocardial infarction: Secondary | ICD-10-CM | POA: Insufficient documentation

## 2021-06-10 HISTORY — PX: COLONOSCOPY WITH PROPOFOL: SHX5780

## 2021-06-10 LAB — GLUCOSE, CAPILLARY: Glucose-Capillary: 231 mg/dL — ABNORMAL HIGH (ref 70–99)

## 2021-06-10 SURGERY — COLONOSCOPY WITH PROPOFOL
Anesthesia: General

## 2021-06-10 MED ORDER — SODIUM CHLORIDE 0.9 % IV SOLN: INTRAVENOUS | Status: DC

## 2021-06-10 MED ORDER — PROPOFOL 500 MG/50ML IV EMUL
INTRAVENOUS | Status: DC | PRN
  Administered 2021-06-10: 110 ug/kg/min via INTRAVENOUS

## 2021-06-10 MED ORDER — DEXMEDETOMIDINE HCL IN NACL 200 MCG/50ML IV SOLN
INTRAVENOUS | Status: DC | PRN
  Administered 2021-06-10: 12 ug via INTRAVENOUS

## 2021-06-10 MED ORDER — PROPOFOL 10 MG/ML IV BOLUS
INTRAVENOUS | Status: DC | PRN
  Administered 2021-06-10: 100 mg via INTRAVENOUS

## 2021-06-10 MED ORDER — LIDOCAINE HCL (CARDIAC) PF 100 MG/5ML IV SOSY
PREFILLED_SYRINGE | INTRAVENOUS | Status: DC | PRN
  Administered 2021-06-10: 50 mg via INTRAVENOUS

## 2021-06-10 NOTE — Anesthesia Preprocedure Evaluation (Addendum)
Anesthesia Evaluation  Patient identified by MRN, date of birth, ID band Patient awake    Reviewed: Allergy & Precautions, NPO status , Patient's Chart, lab work & pertinent test results  History of Anesthesia Complications Negative for: history of anesthetic complications  Airway Mallampati: II  TM Distance: >3 FB Neck ROM: Full    Dental no notable dental hx. (+) Teeth Intact   Pulmonary neg pulmonary ROS, neg sleep apnea, neg COPD, Patient abstained from smoking.Not current smoker,    Pulmonary exam normal breath sounds clear to auscultation       Cardiovascular Exercise Tolerance: Good METS(-) hypertension+ Past MI  (-) CAD (-) dysrhythmias  Rhythm:Regular Rate:Normal - Systolic murmurs Unremarkable echo 2020   Neuro/Psych  Headaches, PSYCHIATRIC DISORDERS Anxiety Depression    GI/Hepatic neg GERD  ,(+)     (-) substance abuse  ,   Endo/Other  diabetes  Renal/GU negative Renal ROS     Musculoskeletal   Abdominal   Peds  Hematology   Anesthesia Other Findings Past Medical History: No date: Agoraphobia No date: Allergy No date: Anxiety No date: Depression No date: Diabetes mellitus without complication (H[facility]) No date: Hyperlipidemia No date: Migraines 03/24/2019: NSTEMI (non-ST elevation myocardial infarction) ([place])  Reproductive/Obstetrics                            Anesthesia Physical Anesthesia Plan  ASA: 2  Anesthesia Plan: General   Post-op Pain Management: Minimal or no pain anticipated   Induction: Intravenous  PONV Risk Score and Plan: 2 and Propofol infusion, TIVA and Ondansetron  Airway Management Planned: Nasal Cannula  Additional Equipment: None  Intra-op Plan:   Post-operative Plan:   Informed Consent: I have reviewed the patients History and Physical, chart, labs and discussed the procedure including the risks, benefits and alternatives for the proposed  anesthesia with the patient or authorized representative who has indicated his/her understanding and acceptance.     Dental advisory given  Plan Discussed with: CRNA and Surgeon  Anesthesia Plan Comments: (Discussed risks of anesthesia with patient, including possibility of difficulty with spontaneous ventilation under anesthesia necessitating airway intervention, PONV, and rare risks such as cardiac or respiratory or neurological events, and allergic reactions. Discussed the role of CRNA in patient's perioperative care. Patient understands.)        Anesthesia Quick Evaluation

## 2021-06-10 NOTE — Transfer of Care (Signed)
Immediate Anesthesia Transfer of Care Note  Patient: Glenn Flynn  Procedure(s) Performed: COLONOSCOPY WITH PROPOFOL  Patient Location: PACU and Endoscopy Unit  Anesthesia Type:General  Level of Consciousness: drowsy and patient cooperative  Airway & Oxygen Therapy: Patient Spontanous Breathing  Post-op Assessment: Report given to RN and Post -op Vital signs reviewed and stable  Post vital signs: Reviewed and stable  Last Vitals:  Vitals Value Taken Time  BP 87/49 06/10/21 1052  Temp 35.9 C 06/10/21 1052  Pulse 65 06/10/21 1054  Resp 14 06/10/21 1054  SpO2 94 % 06/10/21 1054  Vitals shown include unvalidated device data.  Last Pain:  Vitals:   06/10/21 1052  TempSrc: Temporal  PainSc: Asleep         Complications: No notable events documented.

## 2021-06-10 NOTE — H&P (Signed)
Glenn Bellows, MD [address], [place], [place], [place], [address] [address], [place], [place], [place], [address] Phone: [phone]  Fax: [phone]  Primary Care Physician:  Valerie Roys, DO   Pre-Procedure History & Physical: HPI:  Glenn Flynn is a 51 y.o. male is here for an colonoscopy.   Past Medical History:  Diagnosis Date   Agoraphobia    Allergy    Anxiety    Depression    Diabetes mellitus without complication ([place])    Hyperlipidemia    Migraines    NSTEMI (non-ST elevation myocardial infarction) ([place]) 03/24/2019    Past Surgical History:  Procedure Laterality Date   LEFT HEART CATH AND CORONARY ANGIOGRAPHY N/A 02/21/2018   Procedure: LEFT HEART CATH AND CORONARY ANGIOGRAPHY;  Surgeon: Corey Skains, MD;  Location: [facility];  Service: Cardiovascular;  Laterality: N/A;   LEFT HEART CATH AND CORONARY ANGIOGRAPHY N/A 03/27/2019   Procedure: LEFT HEART CATH AND CORONARY ANGIOGRAPHY and possible PCI and stent;  Surgeon: Yolonda Kida, MD;  Location: [facility];  Service: Cardiovascular;  Laterality: N/A;   TONSILLECTOMY  Age 68    Prior to Admission medications   Medication Sig Start Date End Date Taking? Authorizing Provider  ALPRAZolam Duanne Moron) 0.5 MG tablet Take 1 tablet (0.5 mg total) by mouth 2 (two) times daily as needed for anxiety. 05/20/21  Yes Johnson, Megan P, DO  metFORMIN (GLUCOPHAGE-XR) 500 MG 24 hr tablet TAKE 2 TABLETS TWO TIMES A DAY 05/20/21  Yes Johnson, Megan P, DO  traZODone (DESYREL) 100 MG tablet TAKE 1 TABLET BY MOUTH EVERYDAY AT BEDTIME 05/20/21  Yes Johnson, Megan P, DO  atorvastatin (LIPITOR) 80 MG tablet Take 1 tablet (80 mg total) by mouth daily at 6 PM. 05/20/21   Johnson, Megan P, DO  buPROPion (WELLBUTRIN XL) 150 MG 24 hr tablet Take 1 tablet (150 mg total) by mouth daily. 05/20/21   Johnson, Megan P, DO  escitalopram (LEXAPRO) 20 MG tablet Take 0.5 tablets (10 mg total) by mouth at bedtime  for 7 days, THEN 1 tablet (20 mg total) at bedtime. 05/20/21 06/26/21  Park Liter P, DO  Multiple Vitamins-Minerals (MULTIVITAMIN WITH MINERALS) tablet Take 1 tablet by mouth daily.    [provider]  Semaglutide (RYBELSUS) 14 MG TABS Take 14 mg by mouth daily. 07/19/21 09/17/21  Johnson, Megan P, DO  Semaglutide (RYBELSUS) 3 MG TABS Take 3 mg by mouth daily. 05/20/21   Johnson, Megan P, DO  Semaglutide (RYBELSUS) 7 MG TABS Take 7 mg by mouth daily. 06/19/21 07/19/21  [place] P, DO    Allergies as of 05/21/2021   (No Known Allergies)    Family History  Problem Relation Age of Onset   Diabetes Mother    Hypertension Mother    Diabetes Father    Hypertension Father    Heart disease Maternal Grandmother    Heart disease Maternal Grandfather    Stroke Paternal Grandfather    Cancer Neg Hx    COPD Neg Hx     Social History   Socioeconomic History   Marital status: Married    Spouse name: Not on file   Number of children: Not on file   Years of education: Not on file   Highest education level: Not on file  Occupational History   Not on file  Tobacco Use   Smoking status: Never   Smokeless tobacco: Never  Vaping Use  Vaping Use: Never used  Substance and Sexual Activity   Alcohol use: No    Alcohol/week: 0.0 standard drinks    Comment: rare   Drug use: No   Sexual activity: Yes  Other Topics Concern   Not on file  Social History Narrative   Not on file   Social Determinants of Health   Financial Resource Strain: Not on file  Food Insecurity: Not on file  Transportation Needs: Not on file  Physical Activity: Not on file  Stress: Not on file  Social Connections: Not on file  Intimate Partner Violence: Not on file    Review of Systems: See HPI, otherwise negative ROS  Physical Exam: BP 116/83    Pulse 62    Temp (!) 96.9 F (36.1 C) (Temporal)    Resp 18    Ht 5\' 11"  (1.803 m)    Wt 99.8 kg    SpO2 98%    BMI 30.68 kg/m  General:   Alert,  pleasant  and cooperative in NAD Head:  Normocephalic and atraumatic. Neck:  Supple; no masses or thyromegaly. Lungs:  Clear throughout to auscultation, normal respiratory effort.    Heart:  +S1, +S2, Regular rate and rhythm, No edema. Abdomen:  Soft, nontender and nondistended. Normal bowel sounds, without guarding, and without rebound.   Neurologic:  Alert and  oriented x4;  grossly normal neurologically.  Impression/Plan: Pranav Lince is here for an colonoscopy to be performed for Screening colonoscopy average risk   Risks, benefits, limitations, and alternatives regarding  colonoscopy have been reviewed with the patient.  Questions have been answered.  All parties agreeable.   Glenn Bellows, MD  06/10/2021, 10:24 AM

## 2021-06-10 NOTE — Op Note (Signed)
[facility] Patient Name: Glenn Flynn Procedure Date: 06/10/2021 10:23 AM MRN: [id number] Account #: [id number] Date of Birth: 1970-09-12 Admit Type: Outpatient Age: 51 Room: [facility] ENDO ROOM 2 Gender: Male Note Status: Finalized Instrument Name: Park Meo [id number] Procedure:             Colonoscopy Indications:           Screening for colorectal malignant neoplasm Providers:             Jonathon Bellows MD, MD Referring MD:          Valerie Roys (Referring MD) Medicines:             Monitored Anesthesia Care Complications:         No immediate complications. Procedure:             Pre-Anesthesia Assessment:                        - Prior to the procedure, a History and Physical was                         performed, and patient medications, allergies and                         sensitivities were reviewed. The patient's tolerance                         of previous anesthesia was reviewed.                        - The risks and benefits of the procedure and the                         sedation options and risks were discussed with the                         patient. All questions were answered and informed                         consent was obtained.                        - ASA Grade Assessment: II - A patient with mild                         systemic disease.                        After obtaining informed consent, the colonoscope was                         passed under direct vision. Throughout the procedure,                         the patient's blood pressure, pulse, and oxygen                         saturations were monitored continuously. The                         Colonoscope was introduced  through the anus and                         advanced to the the cecum, identified by the                         appendiceal orifice. The colonoscopy was performed                         with ease. The patient tolerated the procedure well.                          The quality of the bowel preparation was excellent. Findings:      The perianal and digital rectal examinations were normal.      Two sessile polyps were found in the transverse colon. The polyps were 4       to 5 mm in size. These polyps were removed with a cold snare. Resection       and retrieval were complete.      A 4 mm polyp was found in the rectum. The polyp was sessile. The polyp       was removed with a cold snare. Resection and retrieval were complete.      The exam was otherwise without abnormality on direct and retroflexion       views. Impression:            - Two 4 to 5 mm polyps in the transverse colon,                         removed with a cold snare. Resected and retrieved.                        - One 4 mm polyp in the rectum, removed with a cold                         snare. Resected and retrieved.                        - The examination was otherwise normal on direct and                         retroflexion views. Recommendation:        - Discharge patient to home (with escort).                        - Resume previous diet.                        - Continue present medications.                        - Await pathology results.                        - Repeat colonoscopy for surveillance based on                         pathology results. Procedure Code(s):     --- Professional ---  45385, Colonoscopy, flexible; with removal of                         tumor(s), polyp(s), or other lesion(s) by snare                         technique Diagnosis Code(s):     --- Professional ---                        Z12.11, Encounter for screening for malignant neoplasm                         of colon                        K63.5, Polyp of colon                        K62.1, Rectal polyp CPT copyright 2019 [employer]. All rights reserved. The codes documented in this report are preliminary and upon coder review may  be  revised to meet current compliance requirements. Jonathon Bellows, MD Jonathon Bellows MD, MD 06/10/2021 10:48:52 AM This report has been signed electronically. Number of Addenda: 0 Note Initiated On: 06/10/2021 10:23 AM Scope Withdrawal Time: 0 hours 12 minutes 46 seconds  Total Procedure Duration: 0 hours 14 minutes 45 seconds  Estimated Blood Loss:  Estimated blood loss: none.      [facility]

## 2021-06-10 NOTE — Anesthesia Postprocedure Evaluation (Signed)
Anesthesia Post Note  Patient: Glenn Flynn  Procedure(s) Performed: COLONOSCOPY WITH PROPOFOL  Patient location during evaluation: Endoscopy Anesthesia Type: General Level of consciousness: awake and alert Pain management: pain level controlled Vital Signs Assessment: post-procedure vital signs reviewed and stable Respiratory status: spontaneous breathing, nonlabored ventilation, respiratory function stable and patient connected to nasal cannula oxygen Cardiovascular status: blood pressure returned to baseline and stable Postop Assessment: no apparent nausea or vomiting Anesthetic complications: no   No notable events documented.   Last Vitals:  Vitals:   06/10/21 1108 06/10/21 1121  BP: 105/72 118/78  Pulse: 60 62  Resp: 18   Temp:    SpO2: 97% 97%    Last Pain:  Vitals:   06/10/21 1121  TempSrc:   PainSc: 0-No pain                 Arita Miss

## 2021-06-11 ENCOUNTER — Encounter: Payer: Self-pay | Admitting: Gastroenterology

## 2021-06-11 LAB — SURGICAL PATHOLOGY

## 2021-06-19 ENCOUNTER — Encounter: Payer: Self-pay | Admitting: Gastroenterology

## 2021-08-15 ENCOUNTER — Ambulatory Visit: Payer: No Typology Code available for payment source | Admitting: Family Medicine

## 2021-08-15 ENCOUNTER — Encounter: Payer: Self-pay | Admitting: Family Medicine

## 2021-08-15 VITALS — BP 102/68 | HR 61 | Temp 97.9°F | Wt 219.0 lb

## 2021-08-15 DIAGNOSIS — E1165 Type 2 diabetes mellitus with hyperglycemia: Secondary | ICD-10-CM | POA: Diagnosis not present

## 2021-08-15 DIAGNOSIS — F419 Anxiety disorder, unspecified: Secondary | ICD-10-CM

## 2021-08-15 LAB — BAYER DCA HB A1C WAIVED: HB A1C (BAYER DCA - WAIVED): 10 % — ABNORMAL HIGH (ref 4.8–5.6)

## 2021-08-15 MED ORDER — RYBELSUS 3 MG PO TABS: 3.0000 mg | ORAL_TABLET | Freq: Every day | ORAL | 0 refills | Status: DC

## 2021-08-15 MED ORDER — RYBELSUS 7 MG PO TABS: 7.0000 mg | ORAL_TABLET | Freq: Every day | ORAL | 1 refills | Status: DC

## 2021-08-15 MED ORDER — ALPRAZOLAM 0.5 MG PO TABS: 0.5000 mg | ORAL_TABLET | Freq: Two times a day (BID) | ORAL | 0 refills | Status: DC | PRN

## 2021-08-15 NOTE — Assessment & Plan Note (Signed)
Under good control on current regimen. Continue current regimen. Continue to monitor. Call with any concerns. Refills given for 3 months. Follow up 3 months.    

## 2021-08-15 NOTE — Assessment & Plan Note (Signed)
Doing much better with A1c of 10 down from 12. Would like to try rybelsus again. Will not plan on increasing to '14mg'$ . Recheck 3 months. Call with any concerns.  ?

## 2021-08-15 NOTE — Progress Notes (Signed)
? ?BP 102/68   Pulse 61   Temp 97.9 ?F (36.6 ?C)   Wt 219 lb (99.3 kg)   SpO2 98%   BMI 30.54 kg/m?   ? ?Subjective:  ? ? Patient ID: Glenn Flynn, male    DOB: 1971-02-20, 51 y.o.   MRN: [id number] ? ?HPI: ?Glenn Flynn is a 51 y.o. male ? ?Chief Complaint  ?Patient presents with  ? Diabetes  ?  Patient states he was not able to tolerate rybelsus '7mg'$ , it made him nauseated and loss of appetite. Patient states he stopped taking it a month ago.   ? Hyperlipidemia  ? Hypertension  ? Anxiety  ? ?DIABETES ?Hypoglycemic episodes:no ?Polydipsia/polyuria: no ?Visual disturbance: no ?Chest pain: no ?Paresthesias: no ?Side effects: loss of appetite and nausea ?Glucose Monitoring: no ? Accucheck frequency: Not Checking ?Taking Insulin?: no ?Blood Pressure Monitoring: not checking ?Retinal Examination: Up to Date ?Foot Exam: Up to Date ?Diabetic Education: Completed ?Pneumovax: Up to Date ?Influenza: Up to Date ?Aspirin: yes ? ?ANXIETY/STRESS ?Duration: chronic ?Status:better ?Anxious mood: yes  ?Excessive worrying: no ?Irritability: no  ?Sweating: no ?Nausea: no ?Palpitations:no ?Hyperventilation: no ?Panic attacks: no ?Agoraphobia: no  ?Obscessions/compulsions: no ?Depressed mood: no ? ?  08/15/2021  ?  9:33 AM 05/20/2021  ? 11:26 AM 10/10/2020  ?  1:09 PM 08/18/2019  ?  3:16 PM 05/26/2019  ? 12:59 PM  ?Depression screen PHQ 2/9  ?Decreased Interest 0 0 1 0 0  ?Down, Depressed, Hopeless 0 '1 1 1 1  '$ ?PHQ - 2 Score 0 '1 2 1 1  '$ ?Altered sleeping 0 1 0 0 0  ?Tired, decreased energy 1 1 0 1 1  ?Change in appetite 0 0 0 0 0  ?Feeling bad or failure about yourself  0 1 1 0 0  ?Trouble concentrating 1 0 0 0 0  ?Moving slowly or fidgety/restless 0 0 0 0 0  ?Suicidal thoughts 0 0 0 0 0  ?PHQ-9 Score '2 4 3 2 2  '$ ?Difficult doing work/chores Not difficult at all  Not difficult at all Not difficult at all Not difficult at all  ? ? ?  08/15/2021  ?  9:33 AM 05/20/2021  ? 11:26 AM 10/10/2020  ?  1:11 PM 08/18/2019  ?  3:15 PM  ?GAD 7 :  Generalized Anxiety Score  ?Nervous, Anxious, on Edge 0 '1 2 1  '$ ?Control/stop worrying 0 0 2 1  ?Worry too much - different things 0 0 2 0  ?Trouble relaxing 0 0 2 0  ?Restless 0 0 0 0  ?Easily annoyed or irritable 0 '1 1 1  '$ ?Afraid - awful might happen 0 0 0   ?Total GAD 7 Score 0 2 9   ?Anxiety Difficulty Not difficult at all  Not difficult at all Not difficult at all  ? ?Anhedonia: no ?Weight changes: no ?Insomnia: no   ?Hypersomnia: no ?Fatigue/loss of energy: yes ?Feelings of worthlessness: no ?Feelings of guilt: no ?Impaired concentration/indecisiveness: no ?Suicidal ideations: no  ?Crying spells: no ?Recent Stressors/Life Changes: no ?  Relationship problems: no ?  Family stress: no   ?  Financial stress: no  ?  Job stress: no  ?  Recent death/loss: no ? ?Relevant past medical, surgical, family and social history reviewed and updated as indicated. Interim medical history since our last visit reviewed. ?Allergies and medications reviewed and updated. ? ?Review of Systems  ?Constitutional: Negative.   ?Respiratory: Negative.    ?Cardiovascular: Negative.   ?Gastrointestinal:  Negative.   ?Musculoskeletal: Negative.   ?Psychiatric/Behavioral: Negative.    ? ?Per HPI unless specifically indicated above ? ?   ?Objective:  ?  ?BP 102/68   Pulse 61   Temp 97.9 ?F (36.6 ?C)   Wt 219 lb (99.3 kg)   SpO2 98%   BMI 30.54 kg/m?   ?Wt Readings from Last 3 Encounters:  ?08/15/21 219 lb (99.3 kg)  ?06/10/21 220 lb (99.8 kg)  ?05/20/21 219 lb (99.3 kg)  ?  ?Physical Exam ?Vitals and nursing note reviewed.  ?Constitutional:   ?   General: He is not in acute distress. ?   Appearance: Normal appearance. He is obese. He is not ill-appearing, toxic-appearing or diaphoretic.  ?HENT:  ?   Head: Normocephalic and atraumatic.  ?   Right Ear: External ear normal.  ?   Left Ear: External ear normal.  ?   Nose: Nose normal.  ?   Mouth/Throat:  ?   Mouth: Mucous membranes are moist.  ?   Pharynx: Oropharynx is clear.  ?Eyes:  ?    General: No scleral icterus.    ?   Right eye: No discharge.     ?   Left eye: No discharge.  ?   Extraocular Movements: Extraocular movements intact.  ?   Conjunctiva/sclera: Conjunctivae normal.  ?   Pupils: Pupils are equal, round, and reactive to light.  ?Cardiovascular:  ?   Rate and Rhythm: Normal rate and regular rhythm.  ?   Pulses: Normal pulses.  ?   Heart sounds: Normal heart sounds. No murmur heard. ?  No friction rub. No gallop.  ?Pulmonary:  ?   Effort: Pulmonary effort is normal. No respiratory distress.  ?   Breath sounds: Normal breath sounds. No stridor. No wheezing, rhonchi or rales.  ?Chest:  ?   Chest wall: No tenderness.  ?Musculoskeletal:     ?   General: Normal range of motion.  ?   Cervical back: Normal range of motion and neck supple.  ?Skin: ?   General: Skin is warm and dry.  ?   Capillary Refill: Capillary refill takes less than 2 seconds.  ?   Coloration: Skin is not jaundiced or pale.  ?   Findings: No bruising, erythema, lesion or rash.  ?Neurological:  ?   General: No focal deficit present.  ?   Mental Status: He is alert and oriented to person, place, and time. Mental status is at baseline.  ?Psychiatric:     ?   Mood and Affect: Mood normal.     ?   Behavior: Behavior normal.     ?   Thought Content: Thought content normal.     ?   Judgment: Judgment normal.  ? ? ?Results for orders placed or performed in visit on 08/15/21  ?Bayer DCA Hb A1c Waived  ?Result Value Ref Range  ? HB A1C (BAYER DCA - WAIVED) 10.0 (H) 4.8 - 5.6 %  ? ?   ?Assessment & Plan:  ? ?Problem List Items Addressed This Visit   ? ?  ? Endocrine  ? Type 2 diabetes mellitus with hyperglycemia (HCC) - Primary  ?  Doing much better with A1c of 10 down from 12. Would like to try rybelsus again. Will not plan on increasing to '14mg'$ . Recheck 3 months. Call with any concerns.  ? ?  ?  ? Relevant Medications  ? Semaglutide (RYBELSUS) 3 MG TABS  ? Semaglutide (RYBELSUS) 7 MG TABS  ?  Other Relevant Orders  ? Bayer DCA Hb A1c  Waived (Completed)  ?  ? Other  ? Anxiety  ?  Under good control on current regimen. Continue current regimen. Continue to monitor. Call with any concerns. Refills given for 3 months. Follow up 3 months.  ? ? ?  ?  ? Relevant Medications  ? ALPRAZolam (XANAX) 0.5 MG tablet  ?  ? ?Follow up plan: ?Return in about 3 months (around 11/14/2021). ? ? ? ? ? ?

## 2021-11-14 ENCOUNTER — Encounter: Payer: Self-pay | Admitting: Family Medicine

## 2021-11-14 ENCOUNTER — Ambulatory Visit: Payer: No Typology Code available for payment source | Admitting: Family Medicine

## 2021-11-14 VITALS — BP 109/69 | HR 73 | Temp 98.0°F | Wt 209.9 lb

## 2021-11-14 DIAGNOSIS — Z0000 Encounter for general adult medical examination without abnormal findings: Secondary | ICD-10-CM | POA: Diagnosis not present

## 2021-11-14 DIAGNOSIS — Z23 Encounter for immunization: Secondary | ICD-10-CM | POA: Diagnosis not present

## 2021-11-14 DIAGNOSIS — Z125 Encounter for screening for malignant neoplasm of prostate: Secondary | ICD-10-CM | POA: Diagnosis not present

## 2021-11-14 DIAGNOSIS — E782 Mixed hyperlipidemia: Secondary | ICD-10-CM | POA: Diagnosis not present

## 2021-11-14 DIAGNOSIS — F419 Anxiety disorder, unspecified: Secondary | ICD-10-CM | POA: Diagnosis not present

## 2021-11-14 DIAGNOSIS — E1165 Type 2 diabetes mellitus with hyperglycemia: Secondary | ICD-10-CM | POA: Diagnosis not present

## 2021-11-14 LAB — URINALYSIS, ROUTINE W REFLEX MICROSCOPIC
Bilirubin, UA: NEGATIVE
Leukocytes,UA: NEGATIVE
Nitrite, UA: NEGATIVE
RBC, UA: NEGATIVE
Specific Gravity, UA: >1.03 — ABNORMAL HIGH (ref 1.005–1.030)
Urobilinogen, Ur: 0.2 mg/dL (ref 0.2–1.0)
pH, UA: 5.5 (ref 5.0–7.5)

## 2021-11-14 LAB — MICROALBUMIN, URINE WAIVED
Creatinine, Urine Waived: 200 mg/dL (ref 10–300)
Microalb, Ur Waived: 80 mg/L — ABNORMAL HIGH (ref 0–19)

## 2021-11-14 LAB — MICROSCOPIC EXAMINATION
Bacteria, UA: NONE SEEN
Epithelial Cells (non renal): NONE SEEN /hpf (ref 0–10)
RBC, Urine: NONE SEEN /hpf (ref 0–2)
WBC, UA: NONE SEEN /hpf (ref 0–5)

## 2021-11-14 LAB — BAYER DCA HB A1C WAIVED: HB A1C (BAYER DCA - WAIVED): 9.5 % — ABNORMAL HIGH (ref 4.8–5.6)

## 2021-11-14 MED ORDER — TRAZODONE HCL 100 MG PO TABS: ORAL_TABLET | ORAL | 1 refills | Status: DC

## 2021-11-14 MED ORDER — ATORVASTATIN CALCIUM 80 MG PO TABS: 80.0000 mg | ORAL_TABLET | Freq: Every day | ORAL | 1 refills | Status: DC

## 2021-11-14 MED ORDER — BUPROPION HCL ER (XL) 150 MG PO TB24: 150.0000 mg | ORAL_TABLET | Freq: Every day | ORAL | 1 refills | Status: DC

## 2021-11-14 MED ORDER — ALPRAZOLAM 0.5 MG PO TABS: 0.5000 mg | ORAL_TABLET | Freq: Two times a day (BID) | ORAL | 0 refills | Status: DC | PRN

## 2021-11-14 MED ORDER — METFORMIN HCL ER 500 MG PO TB24: ORAL_TABLET | ORAL | 1 refills | Status: DC

## 2021-11-14 MED ORDER — ESCITALOPRAM OXALATE 20 MG PO TABS: 20.0000 mg | ORAL_TABLET | Freq: Every day | ORAL | 1 refills | Status: DC

## 2021-11-14 NOTE — Assessment & Plan Note (Signed)
Under good control on current regimen. Continue current regimen. Continue to monitor. Call with any concerns. Refills given for 3 months. Follow up 3 months.    

## 2021-11-14 NOTE — Progress Notes (Signed)
BP 109/69   Pulse 73   Temp 98 F (36.7 C)   Wt 209 lb 14.4 oz (95.2 kg)   SpO2 97%   BMI 29.28 kg/m    Subjective:    Patient ID: Glenn Flynn, male    DOB: 1971-01-17, 51 y.o.   MRN: [id number]  HPI: Glenn Flynn is a 51 y.o. male presenting on 11/14/2021 for comprehensive medical examination. Current medical complaints include:  DIABETES- has only been taking 3.'5mg'$  of the ryblesus Hypoglycemic episodes:no Polydipsia/polyuria: no Visual disturbance: no Chest pain: no Paresthesias: no Glucose Monitoring: no  Accucheck frequency:  occasionally Taking Insulin?: no Blood Pressure Monitoring: not checking Retinal Examination: Not up to Date Foot Exam: Up to Date Diabetic Education: Completed Pneumovax: Up to Date Influenza: Up to Date Aspirin: yes  HYPERTENSION / HYPERLIPIDEMIA Satisfied with current treatment? yes Duration of hypertension: chronic BP monitoring frequency: not checking BP medication side effects: no Past BP meds: none Duration of hyperlipidemia: chronic Cholesterol medication side effects: no Cholesterol supplements: none Past cholesterol medications: atorvastatin Medication compliance: excellent compliance Aspirin: yes Recent stressors: yes Recurrent headaches: no Visual changes: no Palpitations: no Dyspnea: no Chest pain: no Lower extremity edema: no Dizzy/lightheaded: no  ANXIETY/DEPRESSION Duration: chronic Status:stable Anxious mood: yes  Excessive worrying: yes Irritability: no  Sweating: no Nausea: no Palpitations:no Hyperventilation: no Panic attacks: yes Agoraphobia: no  Obscessions/compulsions: no Depressed mood: no    11/14/2021    8:52 AM 08/15/2021    9:33 AM 05/20/2021   11:26 AM 10/10/2020    1:09 PM 08/18/2019    3:16 PM  Depression screen PHQ 2/9  Decreased Interest 0 0 0 1 0  Down, Depressed, Hopeless 0 0 '1 1 1  '$ PHQ - 2 Score 0 0 '1 2 1  '$ Altered sleeping 0 0 1 0 0  Tired, decreased energy 0 1 1 0 1   Change in appetite 0 0 0 0 0  Feeling bad or failure about yourself  0 0 1 1 0  Trouble concentrating 0 1 0 0 0  Moving slowly or fidgety/restless 0 0 0 0 0  Suicidal thoughts 0 0 0 0 0  PHQ-9 Score 0 '2 4 3 2  '$ Difficult doing work/chores Not difficult at all Not difficult at all  Not difficult at all Not difficult at all   Anhedonia: no Weight changes: no Insomnia: no   Hypersomnia: no Fatigue/loss of energy: no Feelings of worthlessness: no Feelings of guilt: no Impaired concentration/indecisiveness: no Suicidal ideations: no  Crying spells: no Recent Stressors/Life Changes: no   Relationship problems: no   Family stress: no     Financial stress: no    Job stress: no    Recent death/loss: no  Interim Problems from his last visit: no  Depression Screen done today and results listed below:     11/14/2021    8:52 AM 08/15/2021    9:33 AM 05/20/2021   11:26 AM 10/10/2020    1:09 PM 08/18/2019    3:16 PM  Depression screen PHQ 2/9  Decreased Interest 0 0 0 1 0  Down, Depressed, Hopeless 0 0 '1 1 1  '$ PHQ - 2 Score 0 0 '1 2 1  '$ Altered sleeping 0 0 1 0 0  Tired, decreased energy 0 1 1 0 1  Change in appetite 0 0 0 0 0  Feeling bad or failure about yourself  0 0 1 1 0  Trouble concentrating 0 1 0 0 0  Moving slowly or fidgety/restless 0 0 0 0 0  Suicidal thoughts 0 0 0 0 0  PHQ-9 Score 0 '2 4 3 2  '$ Difficult doing work/chores Not difficult at all Not difficult at all  Not difficult at all Not difficult at all    Past Medical History:  Past Medical History:  Diagnosis Date   Agoraphobia    Allergy    Anxiety    Depression    Diabetes mellitus without complication ([place])    Hyperlipidemia    Migraines    NSTEMI (non-ST elevation myocardial infarction) ([place]) 03/24/2019    Surgical History:  Past Surgical History:  Procedure Laterality Date   COLONOSCOPY WITH PROPOFOL N/A 06/10/2021   Procedure: COLONOSCOPY WITH PROPOFOL;  Surgeon: Jonathon Bellows, MD;  Location: [facility]  ENDOSCOPY;  Service: Gastroenterology;  Laterality: N/A;   LEFT HEART CATH AND CORONARY ANGIOGRAPHY N/A 02/21/2018   Procedure: LEFT HEART CATH AND CORONARY ANGIOGRAPHY;  Surgeon: Corey Skains, MD;  Location: [facility];  Service: Cardiovascular;  Laterality: N/A;   LEFT HEART CATH AND CORONARY ANGIOGRAPHY N/A 03/27/2019   Procedure: LEFT HEART CATH AND CORONARY ANGIOGRAPHY and possible PCI and stent;  Surgeon: Yolonda Kida, MD;  Location: [facility];  Service: Cardiovascular;  Laterality: N/A;   TONSILLECTOMY  Age 66    Medications:  Current Outpatient Medications on File Prior to Visit  Medication Sig   Multiple Vitamins-Minerals (MULTIVITAMIN WITH MINERALS) tablet Take 1 tablet by mouth daily.   Semaglutide (RYBELSUS) 3 MG TABS Take 3 mg by mouth daily.   No current facility-administered medications on file prior to visit.    Allergies:  Allergies  Allergen Reactions   Semaglutide Nausea Only    Social History:  Social History   Socioeconomic History   Marital status: Married    Spouse name: Not on file   Number of children: Not on file   Years of education: Not on file   Highest education level: Not on file  Occupational History   Not on file  Tobacco Use   Smoking status: Never   Smokeless tobacco: Never  Vaping Use   Vaping Use: Never used  Substance and Sexual Activity   Alcohol use: No    Alcohol/week: 0.0 standard drinks of alcohol    Comment: rare   Drug use: No   Sexual activity: Yes  Other Topics Concern   Not on file  Social History Narrative   Not on file   Social Determinants of Health   Financial Resource Strain: Not on file  Food Insecurity: Not on file  Transportation Needs: Not on file  Physical Activity: Not on file  Stress: Not on file  Social Connections: Not on file  Intimate Partner Violence: Not on file   Social History   Tobacco Use  Smoking Status Never  Smokeless Tobacco Never   Social History    Substance and Sexual Activity  Alcohol Use No   Alcohol/week: 0.0 standard drinks of alcohol   Comment: rare    Family History:  Family History  Problem Relation Age of Onset   Diabetes Mother    Hypertension Mother    Diabetes Father    Hypertension Father    Heart disease Maternal Grandmother    Heart disease Maternal Grandfather    Stroke Paternal Grandfather    Cancer Neg Hx    COPD Neg Hx     Past medical history, surgical history, medications, allergies, family history and social history  reviewed with patient today and changes made to appropriate areas of the chart.   Review of Systems  Constitutional: Negative.   HENT: Negative.    Eyes: Negative.   Respiratory: Negative.    Cardiovascular: Negative.   Gastrointestinal:  Positive for diarrhea and heartburn. Negative for abdominal pain, blood in stool, constipation, melena, nausea and vomiting.  Genitourinary: Negative.   Musculoskeletal: Negative.   Skin: Negative.   Neurological:  Positive for dizziness and tingling. Negative for tremors, sensory change, speech change, focal weakness, seizures, loss of consciousness, weakness and headaches.  Endo/Heme/Allergies: Negative.   Psychiatric/Behavioral:  Negative for depression, hallucinations, memory loss, substance abuse and suicidal ideas. The patient is nervous/anxious. The patient does not have insomnia.    All other ROS negative except what is listed above and in the HPI.      Objective:    BP 109/69   Pulse 73   Temp 98 F (36.7 C)   Wt 209 lb 14.4 oz (95.2 kg)   SpO2 97%   BMI 29.28 kg/m   Wt Readings from Last 3 Encounters:  11/14/21 209 lb 14.4 oz (95.2 kg)  08/15/21 219 lb (99.3 kg)  06/10/21 220 lb (99.8 kg)    Physical Exam Vitals and nursing note reviewed.  Constitutional:      General: He is not in acute distress.    Appearance: Normal appearance. He is obese. He is not ill-appearing, toxic-appearing or diaphoretic.  HENT:     Head:  Normocephalic and atraumatic.     Right Ear: Tympanic membrane, ear canal and external ear normal. There is no impacted cerumen.     Left Ear: Tympanic membrane, ear canal and external ear normal. There is no impacted cerumen.     Nose: Nose normal. No congestion or rhinorrhea.     Mouth/Throat:     Mouth: Mucous membranes are moist.     Pharynx: Oropharynx is clear. No oropharyngeal exudate or posterior oropharyngeal erythema.  Eyes:     General: No scleral icterus.       Right eye: No discharge.        Left eye: No discharge.     Extraocular Movements: Extraocular movements intact.     Conjunctiva/sclera: Conjunctivae normal.     Pupils: Pupils are equal, round, and reactive to light.  Neck:     Vascular: No carotid bruit.  Cardiovascular:     Rate and Rhythm: Normal rate and regular rhythm.     Pulses: Normal pulses.     Heart sounds: No murmur heard.    No friction rub. No gallop.  Pulmonary:     Effort: Pulmonary effort is normal. No respiratory distress.     Breath sounds: Normal breath sounds. No stridor. No wheezing, rhonchi or rales.  Chest:     Chest wall: No tenderness.  Abdominal:     General: Abdomen is flat. Bowel sounds are normal. There is no distension.     Palpations: Abdomen is soft. There is no mass.     Tenderness: There is no abdominal tenderness. There is no right CVA tenderness, left CVA tenderness, guarding or rebound.     Hernia: No hernia is present.  Genitourinary:    Comments: Genital exam deferred with shared decision making Musculoskeletal:        General: No swelling, tenderness, deformity or signs of injury.     Cervical back: Normal range of motion and neck supple. No rigidity. No muscular tenderness.     Right lower  leg: No edema.     Left lower leg: No edema.  Lymphadenopathy:     Cervical: No cervical adenopathy.  Skin:    General: Skin is warm and dry.     Capillary Refill: Capillary refill takes less than 2 seconds.     Coloration:  Skin is not jaundiced or pale.     Findings: No bruising, erythema, lesion or rash.  Neurological:     General: No focal deficit present.     Mental Status: He is alert and oriented to person, place, and time.     Cranial Nerves: No cranial nerve deficit.     Sensory: No sensory deficit.     Motor: No weakness.     Coordination: Coordination normal.     Gait: Gait normal.     Deep Tendon Reflexes: Reflexes normal.  Psychiatric:        Mood and Affect: Mood normal.        Behavior: Behavior normal.        Thought Content: Thought content normal.        Judgment: Judgment normal.     Results for orders placed or performed in visit on 11/14/21  Microscopic Examination   Urine  Result Value Ref Range   WBC, UA None seen 0 - 5 /hpf   RBC, Urine None seen 0 - 2 /hpf   Epithelial Cells (non renal) None seen 0 - 10 /hpf   Mucus, UA Present (A) Not Estab.   Bacteria, UA None seen None seen/Few  Urinalysis, Routine w reflex microscopic  Result Value Ref Range   Specific Gravity, UA >1.030 (H) 1.005 - 1.030   pH, UA 5.5 5.0 - 7.5   Color, UA Yellow Yellow   Appearance Ur Clear Clear   Leukocytes,UA Negative Negative   Protein,UA 1+ (A) Negative/Trace   Glucose, UA 1+ (A) Negative   Ketones, UA Trace (A) Negative   RBC, UA Negative Negative   Bilirubin, UA Negative Negative   Urobilinogen, Ur 0.2 0.2 - 1.0 mg/dL   Nitrite, UA Negative Negative   Microscopic Examination See below:   Microalbumin, Urine Waived  Result Value Ref Range   Microalb, Ur Waived 80 (H) 0 - 19 mg/L   Creatinine, Urine Waived 200 10 - 300 mg/dL   Microalb/Creat Ratio 30-300 (H) <30 mg/g  Bayer DCA Hb A1c Waived  Result Value Ref Range   HB A1C (BAYER DCA - WAIVED) 9.5 (H) 4.8 - 5.6 %      Assessment & Plan:   Problem List Items Addressed This Visit       Endocrine   Type 2 diabetes mellitus with hyperglycemia (HCC)    Improved with A1c of 9.5 down from 10.0. Discussed changing medication but he  is concerned about cost and has a lot of rybelsus at home. Will really work on diet and make sure he's taking his rybelsus daily. Recheck 3 months. If still >8 in 3 months will change medication.       Relevant Medications   metFORMIN (GLUCOPHAGE-XR) 500 MG 24 hr tablet   atorvastatin (LIPITOR) 80 MG tablet   Other Relevant Orders   Comprehensive metabolic panel   CBC with Differential/Platelet   Microalbumin, Urine Waived (Completed)   Bayer DCA Hb A1c Waived (Completed)     Other   Hyperlipidemia    Under good control on current regimen. Continue current regimen. Continue to monitor. Call with any concerns. Refills given. Labs drawn today.  Relevant Medications   atorvastatin (LIPITOR) 80 MG tablet   Other Relevant Orders   Comprehensive metabolic panel   CBC with Differential/Platelet   Lipid Panel w/o Chol/HDL Ratio   Anxiety    Under good control on current regimen. Continue current regimen. Continue to monitor. Call with any concerns. Refills given for 3 months. Follow up 3 months.         Relevant Medications   escitalopram (LEXAPRO) 20 MG tablet   buPROPion (WELLBUTRIN XL) 150 MG 24 hr tablet   traZODone (DESYREL) 100 MG tablet   ALPRAZolam (XANAX) 0.5 MG tablet   Other Visit Diagnoses     Routine general medical examination at a health care facility    -  Primary   Vaccines up to date. Screening labs checked today. Colonscopy up to date. Continue diet and exercise. Call with any concerns.    Relevant Orders   Comprehensive metabolic panel   CBC with Differential/Platelet   Lipid Panel w/o Chol/HDL Ratio   PSA   TSH   Urinalysis, Routine w reflex microscopic (Completed)   Microalbumin, Urine Waived (Completed)   Bayer DCA Hb A1c Waived (Completed)   Screening for prostate cancer       Labs drawn today. Await results. Treat as needed.    Relevant Orders   Comprehensive metabolic panel   CBC with Differential/Platelet   PSA        LABORATORY  TESTING:  Health maintenance labs ordered today as discussed above.   The natural history of prostate cancer and ongoing controversy regarding screening and potential treatment outcomes of prostate cancer has been discussed with the patient. The meaning of a false positive PSA and a false negative PSA has been discussed. He indicates understanding of the limitations of this screening test and wishes to proceed with screening PSA testing.   IMMUNIZATIONS:   - Tdap: Tetanus vaccination status reviewed: last tetanus booster within 10 years. - Influenza: Postponed to flu season - Pneumovax: Up to date - Prevnar: Not applicable - COVID: Up to date - HPV: Not applicable - Shingrix vaccine: Administered today  SCREENING: - Colonoscopy: Up to date  Discussed with patient purpose of the colonoscopy is to detect colon cancer at curable precancerous or early stages    PATIENT COUNSELING:    Sexuality: Discussed sexually transmitted diseases, partner selection, use of condoms, avoidance of unintended pregnancy  and contraceptive alternatives.   Advised to avoid cigarette smoking.  I discussed with the patient that most people either abstain from alcohol or drink within safe limits (<=14/week and <=4 drinks/occasion for males, <=7/weeks and <= 3 drinks/occasion for females) and that the risk for alcohol disorders and other health effects rises proportionally with the number of drinks per week and how often a drinker exceeds daily limits.  Discussed cessation/primary prevention of drug use and availability of treatment for abuse.   Diet: Encouraged to adjust caloric intake to maintain  or achieve ideal body weight, to reduce intake of dietary saturated fat and total fat, to limit sodium intake by avoiding high sodium foods and not adding table salt, and to maintain adequate dietary potassium and calcium preferably from fresh fruits, vegetables, and low-fat dairy products.    stressed the importance  of regular exercise  Injury prevention: Discussed safety belts, safety helmets, smoke detector, smoking near bedding or upholstery.   Dental health: Discussed importance of regular tooth brushing, flossing, and dental visits.   Follow up plan: NEXT PREVENTATIVE PHYSICAL DUE IN 1 YEAR.  Return in about 3 months (around 02/14/2022).

## 2021-11-14 NOTE — Assessment & Plan Note (Signed)
Improved with A1c of 9.5 down from 10.0. Discussed changing medication but he is concerned about cost and has a lot of rybelsus at home. Will really work on diet and make sure he's taking his rybelsus daily. Recheck 3 months. If still >8 in 3 months will change medication.

## 2021-11-14 NOTE — Assessment & Plan Note (Signed)
Under good control on current regimen. Continue current regimen. Continue to monitor. Call with any concerns. Refills given. Labs drawn today.   

## 2021-11-15 LAB — CBC WITH DIFFERENTIAL/PLATELET
Basophils Absolute: 0 10*3/uL (ref 0.0–0.2)
Basos: 0 %
EOS (ABSOLUTE): 0.4 10*3/uL (ref 0.0–0.4)
Eos: 4 %
Hematocrit: 48.3 % (ref 37.5–51.0)
Hemoglobin: 15.4 g/dL (ref 13.0–17.7)
Immature Grans (Abs): 0 10*3/uL (ref 0.0–0.1)
Immature Granulocytes: 0 %
Lymphocytes Absolute: 1.9 10*3/uL (ref 0.7–3.1)
Lymphs: 24 %
MCH: 29.7 pg (ref 26.6–33.0)
MCHC: 31.9 g/dL (ref 31.5–35.7)
MCV: 93 fL (ref 79–97)
Monocytes Absolute: 0.4 10*3/uL (ref 0.1–0.9)
Monocytes: 6 %
Neutrophils Absolute: 5.2 10*3/uL (ref 1.4–7.0)
Neutrophils: 66 %
Platelets: 277 10*3/uL (ref 150–450)
RBC: 5.19 x10E6/uL (ref 4.14–5.80)
RDW: 12.2 % (ref 11.6–15.4)
WBC: 8 10*3/uL (ref 3.4–10.8)

## 2021-11-15 LAB — COMPREHENSIVE METABOLIC PANEL
ALT: 19 IU/L (ref 0–44)
AST: 11 IU/L (ref 0–40)
Albumin/Globulin Ratio: 2 (ref 1.2–2.2)
Albumin: 4.3 g/dL (ref 3.8–4.9)
Alkaline Phosphatase: 86 IU/L (ref 44–121)
BUN/Creatinine Ratio: 17 (ref 9–20)
BUN: 18 mg/dL (ref 6–24)
Bilirubin Total: 0.6 mg/dL (ref 0.0–1.2)
CO2: 23 mmol/L (ref 20–29)
Calcium: 9.7 mg/dL (ref 8.7–10.2)
Chloride: 100 mmol/L (ref 96–106)
Creatinine, Ser: 1.06 mg/dL (ref 0.76–1.27)
Globulin, Total: 2.1 g/dL (ref 1.5–4.5)
Glucose: 271 mg/dL — ABNORMAL HIGH (ref 70–99)
Potassium: 5.4 mmol/L — ABNORMAL HIGH (ref 3.5–5.2)
Sodium: 136 mmol/L (ref 134–144)
Total Protein: 6.4 g/dL (ref 6.0–8.5)
eGFR: 84 mL/min/{1.73_m2} (ref >59)

## 2021-11-15 LAB — LIPID PANEL W/O CHOL/HDL RATIO
Cholesterol, Total: 168 mg/dL (ref 100–199)
HDL: 44 mg/dL (ref >39)
LDL Chol Calc (NIH): 99 mg/dL (ref 0–99)
Triglycerides: 141 mg/dL (ref 0–149)
VLDL Cholesterol Cal: 25 mg/dL (ref 5–40)

## 2021-11-15 LAB — PSA: Prostate Specific Ag, Serum: 0.7 ng/mL (ref 0.0–4.0)

## 2021-11-15 LAB — TSH: TSH: 1.31 u[IU]/mL (ref 0.450–4.500)

## 2021-12-19 LAB — HM DIABETES EYE EXAM

## 2022-02-20 ENCOUNTER — Encounter: Payer: Self-pay | Admitting: Family Medicine

## 2022-02-20 ENCOUNTER — Ambulatory Visit: Payer: No Typology Code available for payment source | Admitting: Family Medicine

## 2022-02-20 VITALS — BP 111/70 | HR 73 | Temp 98.0°F | Wt 216.6 lb

## 2022-02-20 DIAGNOSIS — J0100 Acute maxillary sinusitis, unspecified: Secondary | ICD-10-CM | POA: Diagnosis not present

## 2022-02-20 DIAGNOSIS — Z23 Encounter for immunization: Secondary | ICD-10-CM

## 2022-02-20 DIAGNOSIS — E1165 Type 2 diabetes mellitus with hyperglycemia: Secondary | ICD-10-CM

## 2022-02-20 LAB — BAYER DCA HB A1C WAIVED: HB A1C (BAYER DCA - WAIVED): 11.1 % — ABNORMAL HIGH (ref 4.8–5.6)

## 2022-02-20 MED ORDER — AMOXICILLIN-POT CLAVULANATE 875-125 MG PO TABS: 1.0000 | ORAL_TABLET | Freq: Two times a day (BID) | ORAL | 0 refills | Status: DC

## 2022-02-20 MED ORDER — RYBELSUS 14 MG PO TABS: 14.0000 mg | ORAL_TABLET | Freq: Every day | ORAL | 1 refills | Status: DC

## 2022-02-20 NOTE — Progress Notes (Signed)
BP 111/70   Pulse 73   Temp 98 F (36.7 C)   Wt 216 lb 9.6 oz (98.2 kg)   SpO2 98%   BMI 30.21 kg/m    Subjective:    Patient ID: Glenn Flynn, male    DOB: 09/15/1970, 51 y.o.   MRN: [id number]  HPI: Glenn Flynn is a 51 y.o. male  Chief Complaint  Patient presents with   Diabetes   URI    Patient states for 2 weeks he's sinus drainage and congestion, has taken Nyquil and Dayquil for a week and mucinex.    DIABETES- has been taking the rybelus for about 6-8 weeks Hypoglycemic episodes:no Polydipsia/polyuria: no Visual disturbance: no Chest pain: no Paresthesias: no Glucose Monitoring: no  Accucheck frequency: Not Checking Taking Insulin?: no Blood Pressure Monitoring: not checking Retinal Examination: Up to Date Foot Exam: Up to Date Diabetic Education: Completed Pneumovax: Up to Date Influenza: Up to Date Aspirin: no  UPPER RESPIRATORY TRACT INFECTION Duration: 2 weeks Worst symptom: congestion and drainage Fever: no Cough: yes Shortness of breath: no Wheezing: no Chest pain: no Chest tightness: no Chest congestion: no Nasal congestion: no Runny nose: yes Post nasal drip: yes Sneezing: yes Sore throat: yes Swollen glands: no Sinus pressure: yes Headache: no Face pain: no Toothache: yes Ear pain: no  Ear pressure: no  Eyes red/itching:no Eye drainage/crusting: no  Vomiting: no Rash: no Fatigue: yes Sick contacts: yes Strep contacts: no  Context: stable Recurrent sinusitis: no Relief with OTC cold/cough medications: no  Treatments attempted: nyquil and dayquil   Relevant past medical, surgical, family and social history reviewed and updated as indicated. Interim medical history since our last visit reviewed. Allergies and medications reviewed and updated.  Review of Systems  Constitutional: Negative.   HENT:  Positive for congestion, postnasal drip, rhinorrhea, sinus pressure, sinus pain and sore throat. Negative for dental  problem, drooling, ear discharge, ear pain, facial swelling, hearing loss, mouth sores, nosebleeds, sneezing, tinnitus, trouble swallowing and voice change.   Respiratory:  Positive for cough. Negative for apnea, choking, chest tightness, shortness of breath, wheezing and stridor.   Cardiovascular: Negative.   Musculoskeletal: Negative.   Skin: Negative.   Psychiatric/Behavioral: Negative.      Per HPI unless specifically indicated above     Objective:    BP 111/70   Pulse 73   Temp 98 F (36.7 C)   Wt 216 lb 9.6 oz (98.2 kg)   SpO2 98%   BMI 30.21 kg/m   Wt Readings from Last 3 Encounters:  02/20/22 216 lb 9.6 oz (98.2 kg)  11/14/21 209 lb 14.4 oz (95.2 kg)  08/15/21 219 lb (99.3 kg)    Physical Exam Vitals and nursing note reviewed.  Constitutional:      General: He is not in acute distress.    Appearance: Normal appearance. He is not ill-appearing, toxic-appearing or diaphoretic.  HENT:     Head: Normocephalic and atraumatic.     Right Ear: Tympanic membrane, ear canal and external ear normal. There is no impacted cerumen.     Left Ear: Tympanic membrane, ear canal and external ear normal. There is no impacted cerumen.     Nose: Congestion and rhinorrhea present.     Mouth/Throat:     Mouth: Mucous membranes are moist.     Pharynx: Oropharynx is clear. No oropharyngeal exudate or posterior oropharyngeal erythema.  Eyes:     General: No scleral icterus.  Right eye: No discharge.        Left eye: No discharge.     Extraocular Movements: Extraocular movements intact.     Conjunctiva/sclera: Conjunctivae normal.     Pupils: Pupils are equal, round, and reactive to light.  Cardiovascular:     Rate and Rhythm: Normal rate and regular rhythm.     Pulses: Normal pulses.     Heart sounds: Normal heart sounds. No murmur heard.    No friction rub. No gallop.  Pulmonary:     Effort: Pulmonary effort is normal. No respiratory distress.     Breath sounds: Normal breath  sounds. No stridor. No wheezing, rhonchi or rales.  Chest:     Chest wall: No tenderness.  Musculoskeletal:        General: Normal range of motion.     Cervical back: Normal range of motion and neck supple.  Skin:    General: Skin is warm and dry.     Capillary Refill: Capillary refill takes less than 2 seconds.     Coloration: Skin is not jaundiced or pale.     Findings: No bruising, erythema, lesion or rash.  Neurological:     General: No focal deficit present.     Mental Status: He is alert and oriented to person, place, and time. Mental status is at baseline.  Psychiatric:        Mood and Affect: Mood normal.        Behavior: Behavior normal.        Thought Content: Thought content normal.        Judgment: Judgment normal.     Results for orders placed or performed in visit on 12/23/21  HM DIABETES EYE EXAM  Result Value Ref Range   HM Diabetic Eye Exam No Retinopathy No Retinopathy      Assessment & Plan:   Problem List Items Addressed This Visit       Endocrine   Type 2 diabetes mellitus with hyperglycemia ([place]) - Primary    A1c up at 11.1. Will increase his rybelsus to '14mg'$  and recheck 1 month. Call with any concerns.       Relevant Medications   Semaglutide (RYBELSUS) 14 MG TABS   Other Relevant Orders   Bayer DCA Hb A1c Waived   Other Visit Diagnoses     Acute non-recurrent maxillary sinusitis       Will treat with augmentin. Call with any concerns or if not getting better.   Relevant Medications   amoxicillin-clavulanate (AUGMENTIN) 875-125 MG tablet        Follow up plan: Return in about 3 months (around 05/23/2022).

## 2022-02-20 NOTE — Addendum Note (Signed)
Addended by: Louanna Raw on: 02/20/2022 10:01 AM   Modules accepted: Orders

## 2022-02-20 NOTE — Assessment & Plan Note (Signed)
A1c up at 11.1. Will increase his rybelsus to '14mg'$  and recheck 1 month. Call with any concerns.

## 2022-05-25 ENCOUNTER — Telehealth (INDEPENDENT_AMBULATORY_CARE_PROVIDER_SITE_OTHER): Payer: Commercial Managed Care - PPO | Admitting: Family Medicine

## 2022-05-25 ENCOUNTER — Encounter: Payer: Self-pay | Admitting: Family Medicine

## 2022-05-25 DIAGNOSIS — E782 Mixed hyperlipidemia: Secondary | ICD-10-CM | POA: Diagnosis not present

## 2022-05-25 DIAGNOSIS — J069 Acute upper respiratory infection, unspecified: Secondary | ICD-10-CM

## 2022-05-25 DIAGNOSIS — E1165 Type 2 diabetes mellitus with hyperglycemia: Secondary | ICD-10-CM

## 2022-05-25 DIAGNOSIS — F419 Anxiety disorder, unspecified: Secondary | ICD-10-CM | POA: Diagnosis not present

## 2022-05-25 DIAGNOSIS — Z7984 Long term (current) use of oral hypoglycemic drugs: Secondary | ICD-10-CM

## 2022-05-25 MED ORDER — METFORMIN HCL ER 500 MG PO TB24: ORAL_TABLET | ORAL | 1 refills | Status: DC

## 2022-05-25 MED ORDER — ALPRAZOLAM 0.5 MG PO TABS: 0.5000 mg | ORAL_TABLET | Freq: Two times a day (BID) | ORAL | 0 refills | Status: DC | PRN

## 2022-05-25 MED ORDER — TRAZODONE HCL 100 MG PO TABS: ORAL_TABLET | ORAL | 1 refills | Status: DC

## 2022-05-25 MED ORDER — ATORVASTATIN CALCIUM 80 MG PO TABS: 80.0000 mg | ORAL_TABLET | Freq: Every day | ORAL | 1 refills | Status: DC

## 2022-05-25 MED ORDER — ESCITALOPRAM OXALATE 20 MG PO TABS: 20.0000 mg | ORAL_TABLET | Freq: Every day | ORAL | 1 refills | Status: DC

## 2022-05-25 MED ORDER — PREDNISONE 50 MG PO TABS: 50.0000 mg | ORAL_TABLET | Freq: Every day | ORAL | 0 refills | Status: DC

## 2022-05-25 MED ORDER — BUPROPION HCL ER (XL) 150 MG PO TB24: 150.0000 mg | ORAL_TABLET | Freq: Every day | ORAL | 1 refills | Status: DC

## 2022-05-25 NOTE — Assessment & Plan Note (Signed)
Tolerating rybelsus well. Will recheck A1c and treat as needed. Await results. Refills given.

## 2022-05-25 NOTE — Assessment & Plan Note (Signed)
Under good control on current regimen. Continue current regimen. Continue to monitor. Call with any concerns. Refills given.   

## 2022-05-25 NOTE — Progress Notes (Signed)
LVM asking patient to call back to schedule an appointment 

## 2022-05-25 NOTE — Progress Notes (Signed)
There were no vitals taken for this visit.   Subjective:    Patient ID: Glenn Flynn, male    DOB: 10-26-1970, 52 y.o.   MRN: [id number]  HPI: Glenn Flynn is a 52 y.o. male  Chief Complaint  Patient presents with   Diabetes   Sinus Problem    Patient says he is having sinus issues again. Patient start with symptoms of sore throat, headache (sinus pressure), drainage and fatigue. Patient says his symptoms started Thursday evening with a scratchy throat. Patient says he has tried generic DayQuil and NyQuil.    DIABETES- tolerating his medicine better. Feeling more like himself Hypoglycemic episodes:no Polydipsia/polyuria: no Visual disturbance: no Chest pain: no Paresthesias: no Glucose Monitoring: no  Accucheck frequency: Not Checking Taking Insulin?: no Blood Pressure Monitoring: not checking Retinal Examination: Up to Date Foot Exam: Up to Date Diabetic Education: Completed Pneumovax: Up to Date Influenza: Up to Date Aspirin: no  HYPERTENSION / HYPERLIPIDEMIA Satisfied with current treatment? yes Duration of hypertension: chronic BP monitoring frequency: not checking BP medication side effects: no Past BP meds: none Duration of hyperlipidemia: chronic Cholesterol medication side effects: no Cholesterol supplements: none Past cholesterol medications: atorvastatin Medication compliance: excellent compliance Aspirin: no Recent stressors: no Recurrent headaches: no Visual changes: no Palpitations: yes Dyspnea: no Chest pain: no Lower extremity edema: no Dizzy/lightheaded: no  ANXIETY/DEPRESSION Duration: chronic Status:stable Anxious mood: yes  Excessive worrying: no Irritability: no  Sweating: no Nausea: no Palpitations:no Hyperventilation: no Panic attacks: no Agoraphobia: no  Obscessions/compulsions: no Depressed mood: no    02/20/2022    8:44 AM 11/14/2021    8:52 AM 08/15/2021    9:33 AM 05/20/2021   11:26 AM 10/10/2020    1:09 PM   Depression screen PHQ 2/9  Decreased Interest 1 0 0 0 1  Down, Depressed, Hopeless 0 0 0 1 1  PHQ - 2 Score 1 0 0 1 2  Altered sleeping 1 0 0 1 0  Tired, decreased energy 0 0 1 1 0  Change in appetite 0 0 0 0 0  Feeling bad or failure about yourself  0 0 0 1 1  Trouble concentrating 0 0 1 0 0  Moving slowly or fidgety/restless 0 0 0 0 0  Suicidal thoughts 0 0 0 0 0  PHQ-9 Score 2 0 '2 4 3  '$ Difficult doing work/chores  Not difficult at all Not difficult at all  Not difficult at all   Anhedonia: no Weight changes: no Insomnia: yes   Hypersomnia: no Fatigue/loss of energy: no Feelings of worthlessness: no Feelings of guilt: no Impaired concentration/indecisiveness: no Suicidal ideations: no  Crying spells: no Recent Stressors/Life Changes: no   Relationship problems: no   Family stress: no     Financial stress: no    Job stress: no    Recent death/loss: no  UPPER RESPIRATORY TRACT INFECTION Duration: 4 days Worst symptom: sore throat Fever: yes Cough: yes Shortness of breath: no Wheezing: no Chest pain: no Chest tightness: yes Chest congestion: no Nasal congestion: yes Runny nose: yes Post nasal drip: yes Sneezing: yes Sore throat: yes Swollen glands: no Sinus pressure: yes Headache: no Face pain: no Toothache: no Ear pain: no  Ear pressure: no  Eyes red/itching:no Eye drainage/crusting: no  Vomiting: no Rash: no Fatigue: yes Sick contacts: yes Strep contacts: no  Context: better Recurrent sinusitis: no Relief with OTC cold/cough medications: no  Treatments attempted: anti-histamine   Relevant past medical, surgical, family and  social history reviewed and updated as indicated. Interim medical history since our last visit reviewed. Allergies and medications reviewed and updated.  Review of Systems  Constitutional: Negative.   HENT:  Positive for congestion, postnasal drip, rhinorrhea, sinus pressure, sinus pain and sore throat. Negative for dental  problem, drooling, ear discharge, ear pain, facial swelling, hearing loss, mouth sores, nosebleeds, sneezing, tinnitus, trouble swallowing and voice change.   Respiratory:  Positive for cough. Negative for apnea, choking, chest tightness, shortness of breath, wheezing and stridor.   Cardiovascular: Negative.   Gastrointestinal: Negative.   Skin: Negative.   Neurological: Negative.   Psychiatric/Behavioral: Negative.      Per HPI unless specifically indicated above     Objective:    There were no vitals taken for this visit.  Wt Readings from Last 3 Encounters:  02/20/22 216 lb 9.6 oz (98.2 kg)  11/14/21 209 lb 14.4 oz (95.2 kg)  08/15/21 219 lb (99.3 kg)    Physical Exam Vitals and nursing note reviewed.  Constitutional:      General: He is not in acute distress.    Appearance: Normal appearance. He is obese. He is not ill-appearing, toxic-appearing or diaphoretic.  HENT:     Head: Normocephalic and atraumatic.     Right Ear: External ear normal.     Left Ear: External ear normal.     Nose: Nose normal.     Mouth/Throat:     Mouth: Mucous membranes are moist.     Pharynx: Oropharynx is clear.  Eyes:     General: No scleral icterus.       Right eye: No discharge.        Left eye: No discharge.     Conjunctiva/sclera: Conjunctivae normal.     Pupils: Pupils are equal, round, and reactive to light.  Pulmonary:     Effort: Pulmonary effort is normal. No respiratory distress.     Comments: Speaking in full sentences Musculoskeletal:        General: Normal range of motion.     Cervical back: Normal range of motion.  Skin:    Coloration: Skin is not jaundiced or pale.     Findings: No bruising, erythema, lesion or rash.  Neurological:     Mental Status: He is alert and oriented to person, place, and time. Mental status is at baseline.  Psychiatric:        Mood and Affect: Mood normal.        Behavior: Behavior normal.        Thought Content: Thought content normal.         Judgment: Judgment normal.     Results for orders placed or performed in visit on 02/20/22  Bayer DCA Hb A1c Waived  Result Value Ref Range   HB A1C (BAYER DCA - WAIVED) 11.1 (H) 4.8 - 5.6 %      Assessment & Plan:   Problem List Items Addressed This Visit       Endocrine   Type 2 diabetes mellitus with hyperglycemia ([place]) - Primary    Tolerating rybelsus well. Will recheck A1c and treat as needed. Await results. Refills given.       Relevant Medications   atorvastatin (LIPITOR) 80 MG tablet   metFORMIN (GLUCOPHAGE-XR) 500 MG 24 hr tablet   Other Relevant Orders   Comprehensive metabolic panel   CBC with Differential/Platelet   Bayer DCA Hb A1c Waived     Other   Hyperlipidemia    Under good  control on current regimen. Continue current regimen. Continue to monitor. Call with any concerns. Refills given. Labs to be drawn when office open.        Relevant Medications   atorvastatin (LIPITOR) 80 MG tablet   Other Relevant Orders   Comprehensive metabolic panel   Lipid Panel w/o Chol/HDL Ratio   Anxiety    Under good control on current regimen. Continue current regimen. Continue to monitor. Call with any concerns. Refills given.        Relevant Medications   buPROPion (WELLBUTRIN XL) 150 MG 24 hr tablet   escitalopram (LEXAPRO) 20 MG tablet   traZODone (DESYREL) 100 MG tablet   ALPRAZolam (XANAX) 0.5 MG tablet   Other Visit Diagnoses     Upper respiratory tract infection, unspecified type       Will check home COVID test and treat as needed. Prednisone sent in for comfort. Call with any concerns.        Follow up plan: Return in about 3 months (around 08/23/2022).    This visit was completed via video visit through MyChart due to the restrictions of the COVID-19 pandemic. All issues as above were discussed and addressed. Physical exam was done as above through visual confirmation on video through MyChart. If it was felt that the patient should be evaluated  in the office, they were directed there. The patient verbally consented to this visit. Location of the patient: home Location of the provider: home Those involved with this call:  Provider: Park Liter, DO CMA: Irena Reichmann, [place] Desk/Registration: [employer]  Time spent on call:  25 minutes with patient face to face via video conference. More than 50% of this time was spent in counseling and coordination of care. 40 minutes total spent in review of patient's record and preparation of their chart.

## 2022-05-25 NOTE — Assessment & Plan Note (Signed)
Under good control on current regimen. Continue current regimen. Continue to monitor. Call with any concerns. Refills given. Labs to be drawn when office open.

## 2022-07-31 ENCOUNTER — Encounter: Payer: Self-pay | Admitting: Family Medicine

## 2022-07-31 ENCOUNTER — Ambulatory Visit: Payer: Commercial Managed Care - PPO | Admitting: Family Medicine

## 2022-07-31 VITALS — BP 120/81 | HR 79 | Temp 97.8°F | Ht 71.0 in | Wt 212.9 lb

## 2022-07-31 DIAGNOSIS — R197 Diarrhea, unspecified: Secondary | ICD-10-CM

## 2022-07-31 NOTE — Patient Instructions (Addendum)
Give your stomach a rest try sugar free popsicles or ice chips and sip water. Stay hydrated.  Try a BRAT diet: Banana, Rice, Applesauce, Toast crackers cooked cereals, like oatmeal or cream of wheat weak tea apple juice or flat soda broth boiled or baked potatoes

## 2022-07-31 NOTE — Assessment & Plan Note (Signed)
Acute, ongoing. CBC, CMP, A1c done today. Will look at [facility], LFT's, and ensure electrolytes are stable. Will treat based on these results. Recommend bowel rest with BRAT diet and increase water intake to prevent dehydration. F/u if symptoms worsen.

## 2022-07-31 NOTE — Progress Notes (Signed)
BP 120/81   Pulse 79   Temp 97.8 F (36.6 C)   Ht 5\' 11"  (1.803 m)   Wt 212 lb 14.4 oz (96.6 kg)   SpO2 98%   BMI 29.69 kg/m    Subjective:    Patient ID: Glenn Flynn, male    DOB: [birth year], 52 y.o.   MRN: [id number]  HPI: Glenn Flynn is a 52 y.o. male  Chief Complaint  Patient presents with   Nausea    Started a weak ago, Tuesday felt chills, acid reflux last sporadically throughout the day, no vomiting, burping fairly reguarly,last week Thursday and vomited, pt states does not drink alcohol, do eat spicy foods,stomach issues   ABDOMINAL DISCOMFORT One week ago he had an episode of vomiting and had to leave work. Four days ago he started feeling nauseous and gassy. He then started having yellow green diarrhea two days later with 4-5 episodes daily for 3 days. He feels pressure/discomfort in his belly when laying down at night.    Denies fever and chills. He also complains of acid reflux. He denies diet changes.   Duration: 4 days Nature:  Discomfort , pressure Location:  generalized   Severity: 5/10  Radiation: yes on right and left side when lying down Frequency: intermittent Alleviating factors: Has not tried anything Aggravating factors: stays the same Treatments attempted: none Constipation: no Diarrhea: yes green and yellow color Episodes of diarrhea/day: 4-5 Mucous in the stool: no Heartburn: no Bloating:yes Flatulence: yes Nausea: yes tried Zofran, but no relief Vomiting: no Episodes of vomit/day: x1 Melena or hematochezia: no Rash: no Jaundice: no Fever: no Weight loss: no   Relevant past medical, surgical, family and social history reviewed and updated as indicated. Interim medical history since our last visit reviewed. Allergies and medications reviewed and updated.  Review of Systems  Constitutional:  Negative for chills and fever.  Respiratory: Negative.    Cardiovascular: Negative.   Gastrointestinal:  Positive for abdominal  pain, diarrhea, nausea and vomiting. Negative for abdominal distention, anal bleeding, blood in stool, constipation and rectal pain.    Per HPI unless specifically indicated above     Objective:    BP 120/81   Pulse 79   Temp 97.8 F (36.6 C)   Ht 5\' 11"  (1.803 m)   Wt 212 lb 14.4 oz (96.6 kg)   SpO2 98%   BMI 29.69 kg/m   Wt Readings from Last 3 Encounters:  07/31/22 212 lb 14.4 oz (96.6 kg)  02/20/22 216 lb 9.6 oz (98.2 kg)  11/14/21 209 lb 14.4 oz (95.2 kg)    Physical Exam Vitals and nursing note reviewed.  Constitutional:      General: He is awake. He is not in acute distress.    Appearance: Normal appearance. He is well-developed and well-groomed. He is not ill-appearing.  HENT:     Head: Normocephalic and atraumatic.     Right Ear: Hearing and external ear normal. No drainage.     Left Ear: Hearing and external ear normal. No drainage.     Nose: Nose normal.  Eyes:     General: Lids are normal.        Right eye: No discharge.        Left eye: No discharge.     Conjunctiva/sclera: Conjunctivae normal.  Cardiovascular:     Rate and Rhythm: Normal rate and regular rhythm.     Heart sounds: Normal heart sounds, S1 normal and S2 normal. No murmur  heard.    No gallop.  Pulmonary:     Effort: Pulmonary effort is normal. No accessory muscle usage or respiratory distress.     Breath sounds: Normal breath sounds.  Abdominal:     General: Bowel sounds are increased. There is no distension.     Palpations: Abdomen is soft.     Tenderness: There is abdominal tenderness in the epigastric area, periumbilical area and suprapubic area.  Musculoskeletal:        General: Normal range of motion.     Cervical back: Full passive range of motion without pain and normal range of motion.     Right lower leg: No edema.     Left lower leg: No edema.  Skin:    General: Skin is warm and dry.     Capillary Refill: Capillary refill takes less than 2 seconds.  Neurological:     Mental  Status: He is alert and oriented to person, place, and time.  Psychiatric:        Attention and Perception: Attention normal.        Mood and Affect: Mood normal.        Speech: Speech normal.        Behavior: Behavior normal. Behavior is cooperative.        Thought Content: Thought content normal.     Results for orders placed or performed in visit on 02/20/22  Bayer DCA Hb A1c Waived  Result Value Ref Range   HB A1C (BAYER DCA - WAIVED) 11.1 (H) 4.8 - 5.6 %      Assessment & Plan:   Problem List Items Addressed This Visit       Other   Diarrhea - Primary    Acute, ongoing. CBC, CMP, A1c done today. Will look at [facility], LFT's, and ensure electrolytes are stable. Will treat based on these results. Recommend bowel rest with BRAT diet and increase water intake to prevent dehydration. F/u if symptoms worsen.       Relevant Orders   CBC With Diff/Platelet   Comp Met (CMET)   HgB A1c     Follow up plan: Return in about 1 month (around 08/30/2022), or if symptoms worsen or fail to improve, for Follow up.

## 2022-08-01 LAB — COMPREHENSIVE METABOLIC PANEL
ALT: 24 IU/L (ref 0–44)
AST: 11 IU/L (ref 0–40)
Albumin/Globulin Ratio: 1.9 (ref 1.2–2.2)
Albumin: 4.4 g/dL (ref 3.8–4.9)
Alkaline Phosphatase: 106 IU/L (ref 44–121)
BUN/Creatinine Ratio: 15 (ref 9–20)
BUN: 15 mg/dL (ref 6–24)
Bilirubin Total: 1.1 mg/dL (ref 0.0–1.2)
CO2: 23 mmol/L (ref 20–29)
Calcium: 10 mg/dL (ref 8.7–10.2)
Chloride: 99 mmol/L (ref 96–106)
Creatinine, Ser: 1.03 mg/dL (ref 0.76–1.27)
Globulin, Total: 2.3 g/dL (ref 1.5–4.5)
Glucose: 204 mg/dL — ABNORMAL HIGH (ref 70–99)
Potassium: 4.9 mmol/L (ref 3.5–5.2)
Sodium: 136 mmol/L (ref 134–144)
Total Protein: 6.7 g/dL (ref 6.0–8.5)
eGFR: 87 mL/min/{1.73_m2} (ref >59)

## 2022-08-01 LAB — CBC WITH DIFF/PLATELET
Basophils Absolute: 0 10*3/uL (ref 0.0–0.2)
Basos: 0 %
EOS (ABSOLUTE): 0.2 10*3/uL (ref 0.0–0.4)
Eos: 3 %
Hematocrit: 49.5 % (ref 37.5–51.0)
Hemoglobin: 16.6 g/dL (ref 13.0–17.7)
Immature Grans (Abs): 0 10*3/uL (ref 0.0–0.1)
Immature Granulocytes: 0 %
Lymphocytes Absolute: 2.2 10*3/uL (ref 0.7–3.1)
Lymphs: 25 %
MCH: 30.2 pg (ref 26.6–33.0)
MCHC: 33.5 g/dL (ref 31.5–35.7)
MCV: 90 fL (ref 79–97)
Monocytes Absolute: 0.9 10*3/uL (ref 0.1–0.9)
Monocytes: 10 %
Neutrophils Absolute: 5.7 10*3/uL (ref 1.4–7.0)
Neutrophils: 62 %
Platelets: 309 10*3/uL (ref 150–450)
RBC: 5.49 x10E6/uL (ref 4.14–5.80)
RDW: 12.4 % (ref 11.6–15.4)
WBC: 9 10*3/uL (ref 3.4–10.8)

## 2022-08-01 LAB — HEMOGLOBIN A1C
Est. average glucose Bld gHb Est-mCnc: 223 mg/dL
Hgb A1c MFr Bld: 9.4 % — ABNORMAL HIGH (ref 4.8–5.6)

## 2022-08-03 ENCOUNTER — Ambulatory Visit: Payer: Self-pay

## 2022-08-03 ENCOUNTER — Encounter: Payer: Self-pay | Admitting: Family Medicine

## 2022-08-03 ENCOUNTER — Ambulatory Visit: Payer: Commercial Managed Care - PPO | Admitting: Family Medicine

## 2022-08-03 ENCOUNTER — Ambulatory Visit
Admission: RE | Admit: 2022-08-03 | Discharge: 2022-08-03 | Disposition: A | Discharge status: Home / Self Care | Payer: Commercial Managed Care - PPO | Source: Ambulatory Visit | Attending: Family Medicine | Admitting: Family Medicine

## 2022-08-03 VITALS — BP 109/77 | HR 109 | Temp 98.1°F | Wt 206.9 lb

## 2022-08-03 DIAGNOSIS — A0472 Enterocolitis due to Clostridium difficile, not specified as recurrent: Secondary | ICD-10-CM | POA: Diagnosis not present

## 2022-08-03 DIAGNOSIS — K219 Gastro-esophageal reflux disease without esophagitis: Secondary | ICD-10-CM | POA: Diagnosis not present

## 2022-08-03 DIAGNOSIS — R197 Diarrhea, unspecified: Secondary | ICD-10-CM

## 2022-08-03 MED ORDER — IOHEXOL 300 MG/ML  SOLN
100.0000 mL | Freq: Once | INTRAMUSCULAR | Status: AC | PRN
  Administered 2022-08-03: 100 mL via INTRAVENOUS

## 2022-08-03 MED ORDER — IOHEXOL 9 MG/ML PO SOLN
500.0000 mL | ORAL | Status: AC
  Administered 2022-08-03 (×2): 500 mL via ORAL

## 2022-08-03 MED ORDER — OMEPRAZOLE 20 MG PO CPDR
20.0000 mg | DELAYED_RELEASE_CAPSULE | Freq: Every day | ORAL | 0 refills | Status: DC
Start: 2022-08-03 — End: 2022-12-15

## 2022-08-03 NOTE — Progress Notes (Signed)
BP 109/77   Pulse (!) 109   Temp 98.1 F (36.7 C) (Oral)   Wt 206 lb 14.4 oz (93.8 kg)   SpO2 98%   BMI 28.86 kg/m    Subjective:    Patient ID: Glenn Flynn, male    DOB: 1970-05-03, 52 y.o.   MRN: [id number]  HPI: Glenn Flynn is a 52 y.o. male  Chief Complaint  Patient presents with   Diarrhea    Pt states he has still been having diarrhea since being seen Friday. States he has been going to the bathroom about every 30 minutes.    DIARRHEA/ ACID REFLUX Patient is returning because his diarrhea symptoms have not resolved. He has had this issue ongoing now for 7 days. He denies recent travel. Last weekend he ate at a couple of different restaurants, no buffet style. Yesterday was the worse he had to use the bathroom every 30 minutes to one hour. His appetite has not been much, applesauce, toast. He is having continued diarrhea with nausea and acid reflux, he denies vomiting, heartburn, and dysphagia. His acid reflux is worsened when lying down and rolling on his side. His symptoms are awaking him from his sleep at night. His bowel movements are yellow and green in color, denies blood in stool, ranging from small to large amounts. He has had some stool incontinence. He has tried anti diarrhea medications and it helped a little, allowed him to sleep longer throughout the night without having a BM. He has had x5 Bms today.  Denies tobacco and alcohol use.  Duration:1 week Ashby Dawes:  Epigastric pain when palpated Location: epigastric  Severity: 10/10  Radiation: no Episode duration: Frequency: constant Alleviating factors: None Aggravating factors: Laying down at night Treatments attempted:  Anti diarrhea medication Constipation: no Diarrhea: yes Episodes of diarrhea/day: Every 30 mins -1 hour Mucous in the stool: no Heartburn: no Bloating:yes Flatulence: yes Nausea: yes Vomiting: no Melena or hematochezia: no Rash: no Jaundice: no Fever: no Weight loss: no    Relevant past medical, surgical, family and social history reviewed and updated as indicated. Interim medical history since our last visit reviewed. Allergies and medications reviewed and updated.  Review of Systems  Constitutional:  Positive for appetite change and fatigue. Negative for chills, diaphoresis and fever.  Respiratory: Negative.    Cardiovascular: Negative.   Gastrointestinal:  Positive for abdominal pain, diarrhea and nausea. Negative for anal bleeding, blood in stool, constipation, rectal pain and vomiting.    Per HPI unless specifically indicated above     Objective:    BP 109/77   Pulse (!) 109   Temp 98.1 F (36.7 C) (Oral)   Wt 206 lb 14.4 oz (93.8 kg)   SpO2 98%   BMI 28.86 kg/m   Wt Readings from Last 3 Encounters:  08/03/22 206 lb 14.4 oz (93.8 kg)  07/31/22 212 lb 14.4 oz (96.6 kg)  02/20/22 216 lb 9.6 oz (98.2 kg)    Physical Exam Vitals and nursing note reviewed.  Constitutional:      General: He is awake. He is not in acute distress.    Appearance: Normal appearance. He is well-developed and well-groomed. He is obese. He is not ill-appearing.  HENT:     Head: Normocephalic and atraumatic.     Right Ear: Hearing and external ear normal. No drainage.     Left Ear: Hearing and external ear normal. No drainage.     Nose: Nose normal.  Eyes:  General: Lids are normal.        Right eye: No discharge.        Left eye: No discharge.     Conjunctiva/sclera: Conjunctivae normal.  Cardiovascular:     Rate and Rhythm: Tachycardia present.     Heart sounds: Normal heart sounds, S1 normal and S2 normal. No murmur heard.    No gallop.  Pulmonary:     Effort: Pulmonary effort is normal. No accessory muscle usage or respiratory distress.     Breath sounds: Normal breath sounds.  Abdominal:     General: Bowel sounds are increased. There is distension.     Tenderness: There is abdominal tenderness in the epigastric area, periumbilical area and  suprapubic area.  Musculoskeletal:        General: Normal range of motion.     Cervical back: Full passive range of motion without pain and normal range of motion.     Right lower leg: No edema.     Left lower leg: No edema.  Skin:    General: Skin is warm and dry.     Capillary Refill: Capillary refill takes less than 2 seconds.  Neurological:     Mental Status: He is alert and oriented to person, place, and time.  Psychiatric:        Attention and Perception: Attention normal.        Mood and Affect: Mood normal.        Speech: Speech normal.        Behavior: Behavior normal. Behavior is cooperative.        Thought Content: Thought content normal.     Results for orders placed or performed in visit on 07/31/22  Comp Met (CMET)  Result Value Ref Range   Glucose 204 (H) 70 - 99 mg/dL   BUN 15 6 - 24 mg/dL   Creatinine, Ser 1.61 0.76 - 1.27 mg/dL   eGFR 87 >09 UE/AVW/0.98   BUN/Creatinine Ratio 15 9 - 20   Sodium 136 134 - 144 mmol/L   Potassium 4.9 3.5 - 5.2 mmol/L   Chloride 99 96 - 106 mmol/L   CO2 23 20 - 29 mmol/L   Calcium 10.0 8.7 - 10.2 mg/dL   Total Protein 6.7 6.0 - 8.5 g/dL   Albumin 4.4 3.8 - 4.9 g/dL   Globulin, Total 2.3 1.5 - 4.5 g/dL   Albumin/Globulin Ratio 1.9 1.2 - 2.2   Bilirubin Total 1.1 0.0 - 1.2 mg/dL   Alkaline Phosphatase 106 44 - 121 IU/L   AST 11 0 - 40 IU/L   ALT 24 0 - 44 IU/L  HgB A1c  Result Value Ref Range   Hgb A1c MFr Bld 9.4 (H) 4.8 - 5.6 %   Est. average glucose Bld gHb Est-mCnc 223 mg/dL  CBC With Diff/Platelet  Result Value Ref Range   WBC 9.0 3.4 - 10.8 x10E3/uL   RBC 5.49 4.14 - 5.80 x10E6/uL   Hemoglobin 16.6 13.0 - 17.7 g/dL   Hematocrit 11.9 14.7 - 51.0 %   MCV 90 79 - 97 fL   MCH 30.2 26.6 - 33.0 pg   MCHC 33.5 31.5 - 35.7 g/dL   RDW 82.9 56.2 - 13.0 %   Platelets 309 150 - 450 x10E3/uL   Neutrophils 62 Not Estab. %   Lymphs 25 Not Estab. %   Monocytes 10 Not Estab. %   Eos 3 Not Estab. %   Basos 0 Not Estab. %    Neutrophils  Absolute 5.7 1.4 - 7.0 x10E3/uL   Lymphocytes Absolute 2.2 0.7 - 3.1 x10E3/uL   Monocytes Absolute 0.9 0.1 - 0.9 x10E3/uL   EOS (ABSOLUTE) 0.2 0.0 - 0.4 x10E3/uL   Basophils Absolute 0.0 0.0 - 0.2 x10E3/uL   Immature Granulocytes 0 Not Estab. %   Immature Grans (Abs) 0.0 0.0 - 0.1 x10E3/uL      Assessment & Plan:   Problem List Items Addressed This Visit       Other   Diarrhea - Primary    Acute, ongoing. STAT abodminal CT with contrast ordered to rule out GI malformations. Stool kit given for patient to return stool samples to detect for C.diff, H.pylori, leukocytes, ova, parasites, and occult.       Relevant Orders   Cdiff NAA+O+P+Stool Culture   Fecal leukocytes   H. pylori antigen, stool   Fecal occult blood, imunochemical   CT Abdomen Pelvis W Contrast   Other Visit Diagnoses     Gastroesophageal reflux disease without esophagitis       Acute, ongoing. Omeprazole 20 mg for 14 days to help with relief. Instructed to eat while sitting up and avoid lying down immediately after meals.   Relevant Medications   omeprazole (PRILOSEC) 20 MG capsule        Follow up plan: Return in about 29 days (around 09/01/2022) for Follow up DM2, HLD, anxiety.

## 2022-08-03 NOTE — Telephone Encounter (Signed)
  Chief Complaint: Diarrhea Symptoms: water like stool Frequency: Since last week Pertinent Negatives: Patient denies  Disposition: [] ED /[] Urgent Care (no appt availability in office) / [x] Appointment(In office/virtual)/ []  [place] [facility]/ [] Home Care/ [] Refused Recommended Disposition /[] [place] [facility]/ []  Follow-up with PCP Additional Notes: PT continues with diarrhea. Diarrhea was much worse yesterday. PT was in the bathroom all day. Per note form 4/10 visit pt was to rtc if no improvement. PT took some otc diarrhea medication which was mildly helpful.    Summary: diarrhea   Patient called stated he wanted his provider to know his stomach is still upset, he has been going to the bathroom every 15-20 minutes non stop with diarrhea, can't eat/drink.     Reason for Disposition  [1] SEVERE diarrhea (e.g., 7 or more times / day more than normal) AND [2] present > 24 hours (1 day)  Answer Assessment - Initial Assessment Questions 1. DIARRHEA SEVERITY: "How bad is the diarrhea?" "How many more stools have you had in the past 24 hours than normal?"    - NO DIARRHEA (SCALE 0)   - MILD (SCALE 1-3): Few loose or mushy BMs; increase of 1-3 stools over normal daily number of stools; mild increase in ostomy output.   -  MODERATE (SCALE 4-7): Increase of 4-6 stools daily over normal; moderate increase in ostomy output.   -  SEVERE (SCALE 8-10; OR "WORST POSSIBLE"): Increase of 7 or more stools daily over normal; moderate increase in ostomy output; incontinence.     severe 2. ONSET: "When did the diarrhea begin?"      Last week 3. BM CONSISTENCY: "How loose or watery is the diarrhea?"      Water 4. VOMITING: "Are you also vomiting?" If Yes, ask: "How many times in the past 24 hours?"      no 5. ABDOMEN PAIN: "Are you having any abdomen pain?" If Yes, ask: "What does it feel like?" (e.g., crampy, dull, intermittent, constant)       6. ABDOMEN PAIN SEVERITY: If present, ask: "How  bad is the pain?"  (e.g., Scale 1-10; mild, moderate, or severe)   - MILD (1-3): doesn't interfere with normal activities, abdomen soft and not tender to touch    - MODERATE (4-7): interferes with normal activities or awakens from sleep, abdomen tender to touch    - SEVERE (8-10): excruciating pain, doubled over, unable to do any normal activities        7. ORAL INTAKE: If vomiting, "Have you been able to drink liquids?" "How much liquids have you had in the past 24 hours?"      8. HYDRATION: "Any signs of dehydration?" (e.g., dry mouth [not just dry lips], too weak to stand, dizziness, new weight loss) "When did you last urinate?"      9. EXPOSURE: "Have you traveled to a foreign country recently?" "Have you been exposed to anyone with diarrhea?" "Could you have eaten any food that was spoiled?"      10. ANTIBIOTIC USE: "Are you taking antibiotics now or have you taken antibiotics in the past 2 months?"        11. OTHER SYMPTOMS: "Do you have any other symptoms?" (e.g., fever, blood in stool)  Protocols used: [facility]

## 2022-08-03 NOTE — Patient Instructions (Addendum)
Abdominal CT: [facility] [address] today 08/03/22  Acid Reflux: Avoid lying down immediately after meals and always eat while sitting up.   Continue with oral rehydration: Try Gatorade or ginger ale Try: Potatoes, noodles, rice, wheat, and oat with salt; crackers, bananas, soup, and boiled vegetables    Stay hydrated, continue to drink plenty of fluids.

## 2022-08-03 NOTE — Assessment & Plan Note (Addendum)
Acute, ongoing. STAT abodminal CT with contrast ordered to rule out GI malformations. Stool kit given for patient to return stool samples to detect for C.diff, H.pylori, leukocytes, ova, parasites, and occult.

## 2022-08-04 ENCOUNTER — Encounter: Payer: Self-pay | Admitting: Family Medicine

## 2022-08-06 LAB — CDIFF NAA+O+P+STOOL CULTURE: E coli, Shiga toxin Assay: NEGATIVE

## 2022-08-06 LAB — H. PYLORI ANTIGEN, STOOL: H pylori Ag, Stl: NEGATIVE

## 2022-08-06 LAB — FECAL OCCULT BLOOD, IMMUNOCHEMICAL: Fecal Occult Bld: NEGATIVE

## 2022-08-07 DIAGNOSIS — A0472 Enterocolitis due to Clostridium difficile, not specified as recurrent: Secondary | ICD-10-CM | POA: Insufficient documentation

## 2022-08-07 LAB — CDIFF NAA+O+P+STOOL CULTURE

## 2022-08-07 MED ORDER — METRONIDAZOLE 500 MG PO TABS: 500.0000 mg | ORAL_TABLET | Freq: Three times a day (TID) | ORAL | 0 refills | Status: AC

## 2022-08-07 NOTE — Assessment & Plan Note (Signed)
Acute, ongoing. Stool culture resulted in positive C.Difficile. Metronidazole 500 mg TID for 10 days prescribed. Recommend to follow up if symptoms are not improving.

## 2022-08-07 NOTE — Addendum Note (Signed)
Addended by: Prescott Gum on: 08/07/2022 08:37 AM   Modules accepted: Orders

## 2022-08-08 LAB — CDIFF NAA+O+P+STOOL CULTURE: Toxigenic C. Difficile by PCR: POSITIVE — AB

## 2022-08-09 LAB — FECAL LEUKOCYTES

## 2022-08-09 LAB — CDIFF NAA+O+P+STOOL CULTURE

## 2022-09-01 ENCOUNTER — Ambulatory Visit: Payer: Commercial Managed Care - PPO | Admitting: Family Medicine

## 2022-09-01 ENCOUNTER — Encounter: Payer: Self-pay | Admitting: Family Medicine

## 2022-09-01 VITALS — BP 114/76 | HR 64 | Temp 97.9°F | Wt 228.8 lb

## 2022-09-01 DIAGNOSIS — F419 Anxiety disorder, unspecified: Secondary | ICD-10-CM

## 2022-09-01 DIAGNOSIS — E1165 Type 2 diabetes mellitus with hyperglycemia: Secondary | ICD-10-CM

## 2022-09-01 DIAGNOSIS — A0472 Enterocolitis due to Clostridium difficile, not specified as recurrent: Secondary | ICD-10-CM | POA: Diagnosis not present

## 2022-09-01 LAB — BAYER DCA HB A1C WAIVED: HB A1C (BAYER DCA - WAIVED): 10.2 % — ABNORMAL HIGH (ref 4.8–5.6)

## 2022-09-01 MED ORDER — VANCOMYCIN HCL 125 MG PO CAPS: 125.0000 mg | ORAL_CAPSULE | Freq: Four times a day (QID) | ORAL | 0 refills | Status: AC

## 2022-09-01 NOTE — Progress Notes (Signed)
BP 114/76   Pulse 64   Temp 97.9 F (36.6 C) (Oral)   Wt 228 lb 12.8 oz (103.8 kg)   SpO2 98%   BMI 31.91 kg/m    Subjective:    Patient ID: Glenn Flynn, male    DOB: March 24, 1971, 52 y.o.   MRN: [id number]  HPI: Glenn Flynn is a 52 y.o. male  Chief Complaint  Patient presents with   Diabetes   Still having diarrhea.   DIABETES- was having problems with rybelsus, restarted it after not taking it when he was sick, has been off the medicine for a month Hypoglycemic episodes:no Polydipsia/polyuria: no Visual disturbance: no Chest pain: no Paresthesias: no Glucose Monitoring: yes  Accucheck frequency: occasionally Taking Insulin?: no Blood Pressure Monitoring: not checking Retinal Examination: Up to Date Foot Exam: Up to Date Diabetic Education: Completed Pneumovax: Up to Date Influenza: Up to Date Aspirin: no  ANXIETY/STRESS Duration: chronic Status:stable Anxious mood: yes  Excessive worrying: yes Irritability: no  Sweating: no Nausea: no Palpitations:no Hyperventilation: no Panic attacks: no Agoraphobia: no  Obscessions/compulsions: no Depressed mood: no    09/01/2022    8:15 AM 07/31/2022   10:19 AM 02/20/2022    8:44 AM 11/14/2021    8:52 AM 08/15/2021    9:33 AM  Depression screen PHQ 2/9  Decreased Interest 0 1 1 0 0  Down, Depressed, Hopeless 0 0 0 0 0  PHQ - 2 Score 0 1 1 0 0  Altered sleeping 1 0 1 0 0  Tired, decreased energy 1 1 0 0 1  Change in appetite 0 0 0 0 0  Feeling bad or failure about yourself  0 0 0 0 0  Trouble concentrating 0 0 0 0 1  Moving slowly or fidgety/restless 0 0 0 0 0  Suicidal thoughts 0 0 0 0 0  PHQ-9 Score 2 2 2  0 2  Difficult doing work/chores  Not difficult at all  Not difficult at all Not difficult at all   Anhedonia: no Weight changes: no Insomnia: no   Hypersomnia: no Fatigue/loss of energy: no Feelings of worthlessness: no Feelings of guilt: no Impaired concentration/indecisiveness:  no Suicidal ideations: no  Crying spells: no Recent Stressors/Life Changes: no   Relationship problems: no   Family stress: no     Financial stress: no    Job stress: no    Recent death/loss: no    Relevant past medical, surgical, family and social history reviewed and updated as indicated. Interim medical history since our last visit reviewed. Allergies and medications reviewed and updated.  Review of Systems  Constitutional: Negative.   Respiratory: Negative.    Cardiovascular: Negative.   Gastrointestinal:  Positive for diarrhea. Negative for abdominal distention, abdominal pain, anal bleeding, blood in stool, constipation, nausea, rectal pain and vomiting.  Musculoskeletal: Negative.   Skin: Negative.   Neurological: Negative.   Psychiatric/Behavioral: Negative.      Per HPI unless specifically indicated above     Objective:    BP 114/76   Pulse 64   Temp 97.9 F (36.6 C) (Oral)   Wt 228 lb 12.8 oz (103.8 kg)   SpO2 98%   BMI 31.91 kg/m   Wt Readings from Last 3 Encounters:  09/01/22 228 lb 12.8 oz (103.8 kg)  08/03/22 206 lb 14.4 oz (93.8 kg)  07/31/22 212 lb 14.4 oz (96.6 kg)    Physical Exam Vitals and nursing note reviewed.  Constitutional:  General: He is not in acute distress.    Appearance: Normal appearance. He is not ill-appearing, toxic-appearing or diaphoretic.  HENT:     Head: Normocephalic and atraumatic.     Right Ear: External ear normal.     Left Ear: External ear normal.     Nose: Nose normal.     Mouth/Throat:     Mouth: Mucous membranes are moist.     Pharynx: Oropharynx is clear.  Eyes:     General: No scleral icterus.       Right eye: No discharge.        Left eye: No discharge.     Extraocular Movements: Extraocular movements intact.     Conjunctiva/sclera: Conjunctivae normal.     Pupils: Pupils are equal, round, and reactive to light.  Cardiovascular:     Rate and Rhythm: Normal rate and regular rhythm.     Pulses:  Normal pulses.     Heart sounds: Normal heart sounds. No murmur heard.    No friction rub. No gallop.  Pulmonary:     Effort: Pulmonary effort is normal. No respiratory distress.     Breath sounds: Normal breath sounds. No stridor. No wheezing, rhonchi or rales.  Chest:     Chest wall: No tenderness.  Musculoskeletal:        General: Normal range of motion.     Cervical back: Normal range of motion and neck supple.  Skin:    General: Skin is warm and dry.     Capillary Refill: Capillary refill takes less than 2 seconds.     Coloration: Skin is not jaundiced or pale.     Findings: No bruising, erythema, lesion or rash.  Neurological:     General: No focal deficit present.     Mental Status: He is alert and oriented to person, place, and time. Mental status is at baseline.  Psychiatric:        Mood and Affect: Mood normal.        Behavior: Behavior normal.        Thought Content: Thought content normal.        Judgment: Judgment normal.     Results for orders placed or performed in visit on 08/03/22  Cdiff NAA+O+P+Stool Culture   Specimen: Stool   ST     CD- [id number] MI  Result Value Ref Range   Salmonella/Shigella Screen Final report    Stool Culture result 1 (RSASHR) Comment    Campylobacter Culture Final report    Stool Culture result 1 (CMPCXR) Comment    E coli, Shiga toxin Assay Negative Negative   OVA + PARASITE EXAM Final report    O&P result 1 Comment    Toxigenic C. Difficile by PCR Positive (A) Negative  Fecal occult blood, imunochemical   Specimen: Stool   ST  Result Value Ref Range   Fecal Occult Bld Negative Negative  Fecal leukocytes   ST     CD- [id number] MI  Result Value Ref Range   White Blood Cells (WBC), Stool Final report None Seen   Result 1 Comment   H. pylori antigen, stool  Result Value Ref Range   H pylori Ag, Stl Negative Negative      Assessment & Plan:   Problem List Items Addressed This Visit       Digestive   C. difficile  diarrhea    Still having diarrhea. Will treat with vanco and recheck about a week after he finishes it. Call with any  concerns. Continue to monitor.       Relevant Medications   vancomycin (VANCOCIN) 125 MG capsule   Other Relevant Orders   Stool C-Diff Toxin Assay     Endocrine   Type 2 diabetes mellitus with hyperglycemia (HCC) - Primary   Relevant Orders   Bayer DCA Hb A1c Waived   CBC with Differential/Platelet   Comprehensive metabolic panel     Other   Anxiety    Under good control on current regimen. Continue current regimen. Continue to monitor. Call with any concerns. Refills up to date.          Follow up plan: Return in about 3 months (around 12/16/2022).

## 2022-09-01 NOTE — Assessment & Plan Note (Signed)
Still having diarrhea. Will treat with vanco and recheck about a week after he finishes it. Call with any concerns. Continue to monitor.

## 2022-09-01 NOTE — Assessment & Plan Note (Addendum)
Under good control on current regimen. Continue current regimen. Continue to monitor. Call with any concerns. Refills up to date.   

## 2022-09-02 LAB — CBC WITH DIFFERENTIAL/PLATELET
Basophils Absolute: 0 10*3/uL (ref 0.0–0.2)
Basos: 1 %
EOS (ABSOLUTE): 0.8 10*3/uL — ABNORMAL HIGH (ref 0.0–0.4)
Eos: 12 %
Hematocrit: 46.9 % (ref 37.5–51.0)
Hemoglobin: 15.3 g/dL (ref 13.0–17.7)
Immature Grans (Abs): 0 10*3/uL (ref 0.0–0.1)
Immature Granulocytes: 0 %
Lymphocytes Absolute: 2.2 10*3/uL (ref 0.7–3.1)
Lymphs: 32 %
MCH: 29.7 pg (ref 26.6–33.0)
MCHC: 32.6 g/dL (ref 31.5–35.7)
MCV: 91 fL (ref 79–97)
Monocytes Absolute: 0.6 10*3/uL (ref 0.1–0.9)
Monocytes: 9 %
Neutrophils Absolute: 3.3 10*3/uL (ref 1.4–7.0)
Neutrophils: 46 %
Platelets: 302 10*3/uL (ref 150–450)
RBC: 5.16 x10E6/uL (ref 4.14–5.80)
RDW: 12.5 % (ref 11.6–15.4)
WBC: 7 10*3/uL (ref 3.4–10.8)

## 2022-09-02 LAB — COMPREHENSIVE METABOLIC PANEL
ALT: 23 IU/L (ref 0–44)
AST: 13 IU/L (ref 0–40)
Albumin/Globulin Ratio: 1.9 (ref 1.2–2.2)
Albumin: 4.2 g/dL (ref 3.8–4.9)
Alkaline Phosphatase: 79 IU/L (ref 44–121)
BUN/Creatinine Ratio: 23 — ABNORMAL HIGH (ref 9–20)
BUN: 18 mg/dL (ref 6–24)
Bilirubin Total: 0.4 mg/dL (ref 0.0–1.2)
CO2: 22 mmol/L (ref 20–29)
Calcium: 9.5 mg/dL (ref 8.7–10.2)
Chloride: 97 mmol/L (ref 96–106)
Creatinine, Ser: 0.79 mg/dL (ref 0.76–1.27)
Globulin, Total: 2.2 g/dL (ref 1.5–4.5)
Glucose: 270 mg/dL — ABNORMAL HIGH (ref 70–99)
Potassium: 4.6 mmol/L (ref 3.5–5.2)
Sodium: 136 mmol/L (ref 134–144)
Total Protein: 6.4 g/dL (ref 6.0–8.5)
eGFR: 106 mL/min/{1.73_m2} (ref >59)

## 2022-11-25 ENCOUNTER — Other Ambulatory Visit: Payer: Self-pay | Admitting: Family Medicine

## 2022-11-26 NOTE — Telephone Encounter (Signed)
Requested medication (s) are due for refill today: Yes  Requested medication (s) are on the active medication list: Yes  Last refill:  05/25/22 #30 0RF  Future visit scheduled: Yes  Notes to clinic:  Unable to refill per protocol, cannot delegate.      Requested Prescriptions  Pending Prescriptions Disp Refills   ALPRAZolam (XANAX) 0.5 MG tablet [Pharmacy Med Name: ALPRAZOLAM 0.5 MG TABLET] 30 tablet     Sig: TAKE 1 TABLET BY MOUTH 2 TIMES DAILY AS NEEDED FOR ANXIETY.     Not Delegated - Psychiatry: Anxiolytics/Hypnotics 2 Failed - 11/25/2022  9:42 AM      Failed - This refill cannot be delegated      Failed - Urine Drug Screen completed in last 360 days      Passed - Patient is not pregnant      Passed - Valid encounter within last 6 months    Recent Outpatient Visits           2 months ago Type 2 diabetes mellitus with hyperglycemia, without long-term current use of insulin (HCC)   [place] [facility] Mc[place], Megan P, DO   3 months ago Diarrhea, unspecified type   [place] [facility] [place], Sherran Needs, NP   3 months ago Diarrhea, unspecified type   [place] [facility] [place], Sherran Needs, NP   6 months ago Type 2 diabetes mellitus with hyperglycemia, without long-term current use of insulin (HCC)   [place] [facility], Megan P, DO   9 months ago Type 2 diabetes mellitus with hyperglycemia, without long-term current use of insulin [facility])   [facility] [place], Oralia Rud, DO       Future Appointments             In 2 weeks Laural Benes, Oralia Rud, DO  [facility]

## 2022-12-12 ENCOUNTER — Other Ambulatory Visit: Payer: Self-pay | Admitting: Family Medicine

## 2022-12-15 ENCOUNTER — Encounter: Payer: Self-pay | Admitting: Family Medicine

## 2022-12-15 ENCOUNTER — Ambulatory Visit: Payer: Commercial Managed Care - PPO | Admitting: Family Medicine

## 2022-12-15 VITALS — BP 125/74 | HR 66 | Ht 71.0 in | Wt 223.4 lb

## 2022-12-15 DIAGNOSIS — K219 Gastro-esophageal reflux disease without esophagitis: Secondary | ICD-10-CM

## 2022-12-15 DIAGNOSIS — Z23 Encounter for immunization: Secondary | ICD-10-CM | POA: Diagnosis not present

## 2022-12-15 DIAGNOSIS — A0472 Enterocolitis due to Clostridium difficile, not specified as recurrent: Secondary | ICD-10-CM

## 2022-12-15 DIAGNOSIS — E782 Mixed hyperlipidemia: Secondary | ICD-10-CM

## 2022-12-15 DIAGNOSIS — Z0000 Encounter for general adult medical examination without abnormal findings: Secondary | ICD-10-CM | POA: Diagnosis not present

## 2022-12-15 DIAGNOSIS — E1165 Type 2 diabetes mellitus with hyperglycemia: Secondary | ICD-10-CM | POA: Diagnosis not present

## 2022-12-15 DIAGNOSIS — Z7984 Long term (current) use of oral hypoglycemic drugs: Secondary | ICD-10-CM

## 2022-12-15 DIAGNOSIS — F419 Anxiety disorder, unspecified: Secondary | ICD-10-CM

## 2022-12-15 LAB — BAYER DCA HB A1C WAIVED: HB A1C (BAYER DCA - WAIVED): 10 % — ABNORMAL HIGH (ref 4.8–5.6)

## 2022-12-15 LAB — URINALYSIS, ROUTINE W REFLEX MICROSCOPIC
Bilirubin, UA: NEGATIVE
Leukocytes,UA: NEGATIVE
Nitrite, UA: NEGATIVE
Specific Gravity, UA: >1.03 — ABNORMAL HIGH (ref 1.005–1.030)
Urobilinogen, Ur: 0.2 mg/dL (ref 0.2–1.0)
pH, UA: 5.5 (ref 5.0–7.5)

## 2022-12-15 LAB — MICROALBUMIN, URINE WAIVED
Creatinine, Urine Waived: 300 mg/dL (ref 10–300)
Microalb, Ur Waived: 80 mg/L — ABNORMAL HIGH (ref 0–19)

## 2022-12-15 LAB — MICROSCOPIC EXAMINATION
Bacteria, UA: NONE SEEN
Epithelial Cells (non renal): NONE SEEN /hpf (ref 0–10)
WBC, UA: NONE SEEN /hpf (ref 0–5)

## 2022-12-15 MED ORDER — METFORMIN HCL ER 500 MG PO TB24: ORAL_TABLET | ORAL | 1 refills | Status: DC

## 2022-12-15 MED ORDER — EMPAGLIFLOZIN 25 MG PO TABS
25.0000 mg | ORAL_TABLET | Freq: Every day | ORAL | 2 refills | Status: DC
Start: 2022-12-15 — End: 2023-04-16

## 2022-12-15 MED ORDER — OMEPRAZOLE 20 MG PO CPDR: 20.0000 mg | DELAYED_RELEASE_CAPSULE | Freq: Every day | ORAL | 1 refills | Status: DC

## 2022-12-15 MED ORDER — ATORVASTATIN CALCIUM 80 MG PO TABS: 80.0000 mg | ORAL_TABLET | Freq: Every day | ORAL | 1 refills | Status: DC

## 2022-12-15 MED ORDER — ALPRAZOLAM 0.5 MG PO TABS: 0.5000 mg | ORAL_TABLET | Freq: Two times a day (BID) | ORAL | 0 refills | Status: DC | PRN

## 2022-12-15 MED ORDER — TRAZODONE HCL 100 MG PO TABS: ORAL_TABLET | ORAL | 1 refills | Status: DC

## 2022-12-15 MED ORDER — BUPROPION HCL ER (XL) 150 MG PO TB24: 150.0000 mg | ORAL_TABLET | Freq: Every day | ORAL | 1 refills | Status: DC

## 2022-12-15 MED ORDER — ESCITALOPRAM OXALATE 20 MG PO TABS: 20.0000 mg | ORAL_TABLET | Freq: Every day | ORAL | 1 refills | Status: DC

## 2022-12-15 NOTE — Progress Notes (Signed)
BP 125/74   Pulse 66   Ht 5\' 11"  (1.803 m)   Wt 223 lb 6.4 oz (101.3 kg)   SpO2 97%   BMI 31.16 kg/m    Subjective:    Patient ID: Glenn Flynn, male    DOB: 1971-01-12, 52 y.o.   MRN: [id number]  HPI: Cruize Shaulis is a 52 y.o. male presenting on 12/15/2022 for comprehensive medical examination. Current medical complaints include:  Still having diarrhea most days. He has been having 4-5 episodes a day at most.  DIABETES- has been taking 1/2 of his rybelsus a day Hypoglycemic episodes:no Polydipsia/polyuria: no Visual disturbance: no Chest pain: no Paresthesias: yes Glucose Monitoring: yes  Accucheck frequency:  occasionally Taking Insulin?: no Blood Pressure Monitoring: not checking Retinal Examination: Up to Date Foot Exam: Up to Date Diabetic Education: Completed Pneumovax: Up to Date Influenza: Up to Date Aspirin: no  HYPERLIPIDEMIA- just forgot to take his medicine for the past few months- didn't get refilled Hyperlipidemia status: poor compliance Satisfied with current treatment?  yes Side effects:  no Medication compliance: poor compliance Past cholesterol meds: atorvastatin Supplements: none Aspirin:  no The 10-year ASCVD risk score (Arnett DK, et al., 2019) is: 7.7%   Values used to calculate the score:     Age: 42 years     Sex: Male     Is Non-Hispanic African American: No     Diabetic: Yes     Tobacco smoker: No     Systolic Blood Pressure: 125 mmHg     Is BP treated: No     HDL Cholesterol: 44 mg/dL     Total Cholesterol: 168 mg/dL Chest pain:  no Coronary artery disease:  no  ANXIETY/DEPRESSION Duration: chronic Status:stable Anxious mood: yes  Excessive worrying: no Irritability: no  Sweating: no Nausea: no Palpitations:no Hyperventilation: no Panic attacks: no Agoraphobia: no  Obscessions/compulsions: no Depressed mood: yes    12/15/2022    8:09 AM 09/01/2022    8:15 AM 07/31/2022   10:19 AM 02/20/2022    8:44 AM  11/14/2021    8:52 AM  Depression screen PHQ 2/9  Decreased Interest 1 0 1 1 0  Down, Depressed, Hopeless 1 0 0 0 0  PHQ - 2 Score 2 0 1 1 0  Altered sleeping 0 1 0 1 0  Tired, decreased energy 1 1 1  0 0  Change in appetite 0 0 0 0 0  Feeling bad or failure about yourself  0 0 0 0 0  Trouble concentrating 0 0 0 0 0  Moving slowly or fidgety/restless 0 0 0 0 0  Suicidal thoughts 0 0 0 0 0  PHQ-9 Score 3 2 2 2  0  Difficult doing work/chores Not difficult at all  Not difficult at all  Not difficult at all      12/15/2022    8:09 AM 09/01/2022    8:15 AM 07/31/2022   10:20 AM 11/14/2021    8:52 AM  GAD 7 : Generalized Anxiety Score  Nervous, Anxious, on Edge 1 1 1 1   Control/stop worrying 0 0 0 0  Worry too much - different things 0 0 0 0  Trouble relaxing 0 0 0 0  Restless 0 0 0 0  Easily annoyed or irritable 0 0 0 1  Afraid - awful might happen 0 0 0 0  Total GAD 7 Score 1 1 1 2   Anxiety Difficulty Not difficult at all Not difficult at all Not difficult  at all Not difficult at all   Anhedonia: no Weight changes: no Insomnia: no   Hypersomnia: no Fatigue/loss of energy: yes Feelings of worthlessness: no Feelings of guilt: no Impaired concentration/indecisiveness: no Suicidal ideations: no  Crying spells: no Recent Stressors/Life Changes: no   Relationship problems: no   Family stress: no     Financial stress: no    Job stress: yes    Recent death/loss: no  He currently lives with: wife Interim Problems from his last visit: no  Depression Screen done today and results listed below:     12/15/2022    8:09 AM 09/01/2022    8:15 AM 07/31/2022   10:19 AM 02/20/2022    8:44 AM 11/14/2021    8:52 AM  Depression screen PHQ 2/9  Decreased Interest 1 0 1 1 0  Down, Depressed, Hopeless 1 0 0 0 0  PHQ - 2 Score 2 0 1 1 0  Altered sleeping 0 1 0 1 0  Tired, decreased energy 1 1 1  0 0  Change in appetite 0 0 0 0 0  Feeling bad or failure about yourself  0 0 0 0 0  Trouble  concentrating 0 0 0 0 0  Moving slowly or fidgety/restless 0 0 0 0 0  Suicidal thoughts 0 0 0 0 0  PHQ-9 Score 3 2 2 2  0  Difficult doing work/chores Not difficult at all  Not difficult at all  Not difficult at all    Past Medical History:  Past Medical History:  Diagnosis Date   Agoraphobia    Allergy    Anxiety    Depression    Diabetes mellitus without complication (HCC)    Hyperlipidemia    Migraines    NSTEMI (non-ST elevation myocardial infarction) (HCC) 03/24/2019    Surgical History:  Past Surgical History:  Procedure Laterality Date   COLONOSCOPY WITH PROPOFOL N/A 06/10/2021   Procedure: COLONOSCOPY WITH PROPOFOL;  Surgeon: Wyline Mood, MD;  Location: [facility] ENDOSCOPY;  Service: Gastroenterology;  Laterality: N/A;   LEFT HEART CATH AND CORONARY ANGIOGRAPHY N/A 02/21/2018   Procedure: LEFT HEART CATH AND CORONARY ANGIOGRAPHY;  Surgeon: Lamar Blinks, MD;  Location: [facility] INVASIVE CV LAB;  Service: Cardiovascular;  Laterality: N/A;   LEFT HEART CATH AND CORONARY ANGIOGRAPHY N/A 03/27/2019   Procedure: LEFT HEART CATH AND CORONARY ANGIOGRAPHY and possible PCI and stent;  Surgeon: Alwyn Pea, MD;  Location: [facility] INVASIVE CV LAB;  Service: Cardiovascular;  Laterality: N/A;   TONSILLECTOMY  Age 44    Medications:  Current Outpatient Medications on File Prior to Visit  Medication Sig   Multiple Vitamins-Minerals (MULTIVITAMIN WITH MINERALS) tablet Take 1 tablet by mouth daily.   No current facility-administered medications on file prior to visit.    Allergies:  Allergies  Allergen Reactions   Semaglutide Nausea Only    Social History:  Social History   Socioeconomic History   Marital status: Married    Spouse name: Not on file   Number of children: Not on file   Years of education: Not on file   Highest education level: Bachelor's degree (e.g., BA, AB, BS)  Occupational History   Not on file  Tobacco Use   Smoking status: Never   Smokeless tobacco:  Never  Vaping Use   Vaping status: Never Used  Substance and Sexual Activity   Alcohol use: No    Alcohol/week: 0.0 standard drinks of alcohol    Comment: rare   Drug use: No  Sexual activity: Yes  Other Topics Concern   Not on file  Social History Narrative   Not on file   Social Determinants of Health   Financial Resource Strain: Low Risk  (07/30/2022)   Overall Financial Resource Strain (CARDIA)    Difficulty of Paying Living Expenses: Not very hard  Food Insecurity: No Food Insecurity (07/30/2022)   Hunger Vital Sign    Worried About Running Out of Food in the Last Year: Never true    Ran Out of Food in the Last Year: Never true  Transportation Needs: No Transportation Needs (07/30/2022)   PRAPARE - Administrator, Civil Service (Medical): No    Lack of Transportation (Non-Medical): No  Physical Activity: Unknown (07/30/2022)   Exercise Vital Sign    Days of Exercise per Week: 0 days    Minutes of Exercise per Session: Not on file  Stress: No Stress Concern Present (07/30/2022)   [employer] of Occupational Health - Occupational Stress Questionnaire    Feeling of Stress : Only a little  Social Connections: Moderately Isolated (07/30/2022)   Social Connection and Isolation Panel [NHANES]    Frequency of Communication with Friends and Family: Once a week    Frequency of Social Gatherings with Friends and Family: Once a week    Attends Religious Services: 1 to 4 times per year    Active Member of [employer] or Organizations: No    Attends Engineer, structural: Not on file    Marital Status: Married  Catering manager Violence: Not on file   Social History   Tobacco Use  Smoking Status Never  Smokeless Tobacco Never   Social History   Substance and Sexual Activity  Alcohol Use No   Alcohol/week: 0.0 standard drinks of alcohol   Comment: rare    Family History:  Family History  Problem Relation Age of Onset   Diabetes Mother    Hypertension  Mother    Diabetes Father    Hypertension Father    Heart disease Maternal Grandmother    Heart disease Maternal Grandfather    Stroke Paternal Grandfather    Cancer Neg Hx    COPD Neg Hx     Past medical history, surgical history, medications, allergies, family history and social history reviewed with patient today and changes made to appropriate areas of the chart.   Review of Systems  Constitutional: Negative.   HENT: Negative.    Eyes: Negative.   Respiratory: Negative.    Cardiovascular:  Positive for leg swelling (after a long day). Negative for chest pain, palpitations, orthopnea, claudication and PND.  Gastrointestinal:  Positive for diarrhea. Negative for abdominal pain, blood in stool, constipation, heartburn, melena, nausea and vomiting.  Genitourinary: Negative.   Musculoskeletal: Negative.   Skin: Negative.   Neurological: Negative.   Endo/Heme/Allergies:  Positive for environmental allergies. Negative for polydipsia. Does not bruise/bleed easily.  Psychiatric/Behavioral: Negative.     All other ROS negative except what is listed above and in the HPI.      Objective:    BP 125/74   Pulse 66   Ht 5\' 11"  (1.803 m)   Wt 223 lb 6.4 oz (101.3 kg)   SpO2 97%   BMI 31.16 kg/m   Wt Readings from Last 3 Encounters:  12/15/22 223 lb 6.4 oz (101.3 kg)  09/01/22 228 lb 12.8 oz (103.8 kg)  08/03/22 206 lb 14.4 oz (93.8 kg)    Physical Exam Vitals and nursing note reviewed.  Constitutional:      General: He is not in acute distress.    Appearance: Normal appearance. He is obese. He is not ill-appearing, toxic-appearing or diaphoretic.  HENT:     Head: Normocephalic and atraumatic.     Right Ear: Tympanic membrane, ear canal and external ear normal. There is no impacted cerumen.     Left Ear: Tympanic membrane, ear canal and external ear normal. There is no impacted cerumen.     Nose: Nose normal. No congestion or rhinorrhea.     Mouth/Throat:     Mouth: Mucous  membranes are moist.     Pharynx: Oropharynx is clear. No oropharyngeal exudate or posterior oropharyngeal erythema.  Eyes:     General: No scleral icterus.       Right eye: No discharge.        Left eye: No discharge.     Extraocular Movements: Extraocular movements intact.     Conjunctiva/sclera: Conjunctivae normal.     Pupils: Pupils are equal, round, and reactive to light.  Neck:     Vascular: No carotid bruit.  Cardiovascular:     Rate and Rhythm: Normal rate and regular rhythm.     Pulses: Normal pulses.     Heart sounds: No murmur heard.    No friction rub. No gallop.  Pulmonary:     Effort: Pulmonary effort is normal. No respiratory distress.     Breath sounds: Normal breath sounds. No stridor. No wheezing, rhonchi or rales.  Chest:     Chest wall: No tenderness.  Abdominal:     General: Abdomen is flat. Bowel sounds are normal. There is no distension.     Palpations: Abdomen is soft. There is no mass.     Tenderness: There is no abdominal tenderness. There is no right CVA tenderness, left CVA tenderness, guarding or rebound.     Hernia: No hernia is present.  Genitourinary:    Comments: Genital exam deferred with shared decision making Musculoskeletal:        General: No swelling, tenderness, deformity or signs of injury.     Cervical back: Normal range of motion and neck supple. No rigidity. No muscular tenderness.     Right lower leg: No edema.     Left lower leg: No edema.  Lymphadenopathy:     Cervical: No cervical adenopathy.  Skin:    General: Skin is warm and dry.     Capillary Refill: Capillary refill takes less than 2 seconds.     Coloration: Skin is not jaundiced or pale.     Findings: No bruising, erythema, lesion or rash.  Neurological:     General: No focal deficit present.     Mental Status: He is alert and oriented to person, place, and time.     Cranial Nerves: No cranial nerve deficit.     Sensory: No sensory deficit.     Motor: No weakness.      Coordination: Coordination normal.     Gait: Gait normal.     Deep Tendon Reflexes: Reflexes normal.  Psychiatric:        Mood and Affect: Mood normal.        Behavior: Behavior normal.        Thought Content: Thought content normal.        Judgment: Judgment normal.     Results for orders placed or performed in visit on 12/15/22  Microscopic Examination   BLD  Result Value Ref Range   WBC, UA  None seen 0 - 5 /hpf   RBC, Urine 0-2 0 - 2 /hpf   Epithelial Cells (non renal) None seen 0 - 10 /hpf   Mucus, UA Present (A) Not Estab.   Bacteria, UA None seen None seen/Few  Bayer DCA Hb A1c Waived  Result Value Ref Range   HB A1C (BAYER DCA - WAIVED) 10.0 (H) 4.8 - 5.6 %  Microalbumin, Urine Waived  Result Value Ref Range   Microalb, Ur Waived 80 (H) 0 - 19 mg/L   Creatinine, Urine Waived 300 10 - 300 mg/dL   Microalb/Creat Ratio 30-300 (H) <30 mg/g  Urinalysis, Routine w reflex microscopic  Result Value Ref Range   Specific Gravity, UA >1.030 (H) 1.005 - 1.030   pH, UA 5.5 5.0 - 7.5   Color, UA Yellow Yellow   Appearance Ur Clear Clear   Leukocytes,UA Negative Negative   Protein,UA 1+ (A) Negative/Trace   Glucose, UA Trace (A) Negative   Ketones, UA Trace (A) Negative   RBC, UA Trace (A) Negative   Bilirubin, UA Negative Negative   Urobilinogen, Ur 0.2 0.2 - 1.0 mg/dL   Nitrite, UA Negative Negative   Microscopic Examination See below:       Assessment & Plan:   Problem List Items Addressed This Visit       Digestive   C. difficile diarrhea    Will get him to drop off repeat c diff test to confirm resolution. Stop rybelsus. If c. Diff negative and no better after stopping rybelsus may need to refer back to GI. Continue to monitor.         Endocrine   Type 2 diabetes mellitus with hyperglycemia (HCC)    Slightly improved with A1c of 10.0, however has a lot of GI issues unclear if due to rybelsus. Will stop rybelsus and change to jardiance. Recheck 3 months. Call  with any concerns.       Relevant Medications   atorvastatin (LIPITOR) 80 MG tablet   metFORMIN (GLUCOPHAGE-XR) 500 MG 24 hr tablet   empagliflozin (JARDIANCE) 25 MG TABS tablet   Other Relevant Orders   Bayer DCA Hb A1c Waived (Completed)     Other   Hyperlipidemia    Has stopped his atorvastatin due to not refilling- will restart. Call with any concerns.        Relevant Medications   atorvastatin (LIPITOR) 80 MG tablet   Anxiety    Under good control on current regimen. Continue current regimen. Continue to monitor. Call with any concerns. Refills given for 3 months.        Relevant Medications   ALPRAZolam (XANAX) 0.5 MG tablet   buPROPion (WELLBUTRIN XL) 150 MG 24 hr tablet   escitalopram (LEXAPRO) 20 MG tablet   traZODone (DESYREL) 100 MG tablet   Other Visit Diagnoses     Routine general medical examination at a health care facility    -  Primary   Vaccines up to date. Screening labs checked today. Colonoscopy up to date. Continue diet and exercise. Call with any concerns.   Relevant Orders   Bayer DCA Hb A1c Waived (Completed)   Microalbumin, Urine Waived (Completed)   Comprehensive metabolic panel   CBC with Differential/Platelet   Lipid Panel w/o Chol/HDL Ratio   PSA   TSH   Urinalysis, Routine w reflex microscopic (Completed)   Gastroesophageal reflux disease without esophagitis       Acute, ongoing. Omeprazole 20 mg for 14 days to help with relief.  Instructed to eat while sitting up and avoid lying down immediately after meals.   Relevant Medications   omeprazole (PRILOSEC) 20 MG capsule   Needs flu shot       Relevant Orders   Flu vaccine trivalent PF, 6mos and older(Flulaval,Afluria,Fluarix,Fluzone)       LABORATORY TESTING:  Health maintenance labs ordered today as discussed above.   The natural history of prostate cancer and ongoing controversy regarding screening and potential treatment outcomes of prostate cancer has been discussed with the  patient. The meaning of a false positive PSA and a false negative PSA has been discussed. He indicates understanding of the limitations of this screening test and wishes to proceed with screening PSA testing.   IMMUNIZATIONS:   - Tdap: Tetanus vaccination status reviewed: last tetanus booster within 10 years. - Influenza: Administered today - Pneumovax: Up to date - Prevnar: Not applicable - COVID: Refused - HPV: Not applicable - Shingrix vaccine: Up to date  SCREENING: - Colonoscopy: Up to date  Discussed with patient purpose of the colonoscopy is to detect colon cancer at curable precancerous or early stages   PATIENT COUNSELING:    Sexuality: Discussed sexually transmitted diseases, partner selection, use of condoms, avoidance of unintended pregnancy  and contraceptive alternatives.   Advised to avoid cigarette smoking.  I discussed with the patient that most people either abstain from alcohol or drink within safe limits (<=14/week and <=4 drinks/occasion for males, <=7/weeks and <= 3 drinks/occasion for females) and that the risk for alcohol disorders and other health effects rises proportionally with the number of drinks per week and how often a drinker exceeds daily limits.  Discussed cessation/primary prevention of drug use and availability of treatment for abuse.   Diet: Encouraged to adjust caloric intake to maintain  or achieve ideal body weight, to reduce intake of dietary saturated fat and total fat, to limit sodium intake by avoiding high sodium foods and not adding table salt, and to maintain adequate dietary potassium and calcium preferably from fresh fruits, vegetables, and low-fat dairy products.    stressed the importance of regular exercise  Injury prevention: Discussed safety belts, safety helmets, smoke detector, smoking near bedding or upholstery.   Dental health: Discussed importance of regular tooth brushing, flossing, and dental visits.   Follow up  plan: NEXT PREVENTATIVE PHYSICAL DUE IN 1 YEAR. Return in about 3 months (around 03/17/2023).

## 2022-12-15 NOTE — Assessment & Plan Note (Signed)
Under good control on current regimen. Continue current regimen. Continue to monitor. Call with any concerns. Refills given for 3 months.

## 2022-12-15 NOTE — Assessment & Plan Note (Signed)
Will get him to drop off repeat c diff test to confirm resolution. Stop rybelsus. If c. Diff negative and no better after stopping rybelsus may need to refer back to GI. Continue to monitor.

## 2022-12-15 NOTE — Telephone Encounter (Signed)
Requested Prescriptions  Pending Prescriptions Disp Refills   traZODone (DESYREL) 100 MG tablet [Pharmacy Med Name: TRAZODONE 100 MG TABLET] 90 tablet 0    Sig: TAKE 1 TABLET BY MOUTH EVERYDAY AT BEDTIME     Psychiatry: Antidepressants - Serotonin Modulator Passed - 12/12/2022  2:50 PM      Passed - Valid encounter within last 6 months    Recent Outpatient Visits           3 months ago Type 2 diabetes mellitus with hyperglycemia, without long-term current use of insulin (HCC)   [place] [place], Megan P, DO   4 months ago Diarrhea, unspecified type   [place] [facility] [place], Sherran Needs, NP   4 months ago Diarrhea, unspecified type   [place] [facility], Sherran Needs, NP   6 months ago Type 2 diabetes mellitus with hyperglycemia, without long-term current use of insulin (HCC)   [place] [facility], Megan P, DO   9 months ago Type 2 diabetes mellitus with hyperglycemia, without long-term current use of insulin (HCC)   [place] [facility] [place], [place], DO       Future Appointments             Today Dorcas Carrow, DO [place] [facility], [facility]             buPROPion (WELLBUTRIN XL) 150 MG 24 hr tablet [Pharmacy Med Name: BUPROPION HCL XL 150 MG TABLET] 90 tablet 0    Sig: TAKE 1 TABLET BY MOUTH EVERY DAY     Psychiatry: Antidepressants - bupropion Passed - 12/12/2022  2:50 PM      Passed - Cr in normal range and within 360 days    Creatinine, Ser  Date Value Ref Range Status  09/01/2022 0.79 0.76 - 1.27 mg/dL Final         Passed - AST in normal range and within 360 days    AST  Date Value Ref Range Status  09/01/2022 13 0 - 40 IU/L Final         Passed - ALT in normal range and within 360 days    ALT  Date Value Ref Range Status  09/01/2022 23 0 - 44 IU/L Final         Passed - Last BP in normal range    BP Readings from  Last 1 Encounters:  09/01/22 114/76         Passed - Valid encounter within last 6 months    Recent Outpatient Visits           3 months ago Type 2 diabetes mellitus with hyperglycemia, without long-term current use of insulin (HCC)   [place] [place], Megan P, DO   4 months ago Diarrhea, unspecified type   [place] [facility], Sherran Needs, NP   4 months ago Diarrhea, unspecified type   [place] [facility], Sherran Needs, NP   6 months ago Type 2 diabetes mellitus with hyperglycemia, without long-term current use of insulin (HCC)   [place] [facility], Megan P, DO   9 months ago Type 2 diabetes mellitus with hyperglycemia, without long-term current use of insulin [facility])   [place] [facility] [place], Oralia Rud, DO       Future Appointments  Today Laural Benes, Oralia Rud, DO [place] [facility], [facility]

## 2022-12-15 NOTE — Assessment & Plan Note (Signed)
Slightly improved with A1c of 10.0, however has a lot of GI issues unclear if due to rybelsus. Will stop rybelsus and change to jardiance. Recheck 3 months. Call with any concerns.

## 2022-12-15 NOTE — Assessment & Plan Note (Addendum)
Has stopped his atorvastatin due to not refilling- will restart. Call with any concerns.

## 2022-12-16 LAB — CBC WITH DIFFERENTIAL/PLATELET
Basophils Absolute: 0 10*3/uL (ref 0.0–0.2)
Basos: 0 %
EOS (ABSOLUTE): 0.5 10*3/uL — ABNORMAL HIGH (ref 0.0–0.4)
Eos: 7 %
Hematocrit: 45.9 % (ref 37.5–51.0)
Hemoglobin: 15.3 g/dL (ref 13.0–17.7)
Immature Grans (Abs): 0 10*3/uL (ref 0.0–0.1)
Immature Granulocytes: 0 %
Lymphocytes Absolute: 2.3 10*3/uL (ref 0.7–3.1)
Lymphs: 31 %
MCH: 29.7 pg (ref 26.6–33.0)
MCHC: 33.3 g/dL (ref 31.5–35.7)
MCV: 89 fL (ref 79–97)
Monocytes Absolute: 0.5 10*3/uL (ref 0.1–0.9)
Monocytes: 7 %
Neutrophils Absolute: 4 10*3/uL (ref 1.4–7.0)
Neutrophils: 55 %
Platelets: 305 10*3/uL (ref 150–450)
RBC: 5.16 x10E6/uL (ref 4.14–5.80)
RDW: 12.8 % (ref 11.6–15.4)
WBC: 7.4 10*3/uL (ref 3.4–10.8)

## 2022-12-16 LAB — COMPREHENSIVE METABOLIC PANEL
ALT: 28 IU/L (ref 0–44)
AST: 15 IU/L (ref 0–40)
Albumin: 4.2 g/dL (ref 3.8–4.9)
Alkaline Phosphatase: 66 IU/L (ref 44–121)
BUN/Creatinine Ratio: 17 (ref 9–20)
BUN: 15 mg/dL (ref 6–24)
Bilirubin Total: 0.5 mg/dL (ref 0.0–1.2)
CO2: 21 mmol/L (ref 20–29)
Calcium: 9.1 mg/dL (ref 8.7–10.2)
Chloride: 102 mmol/L (ref 96–106)
Creatinine, Ser: 0.88 mg/dL (ref 0.76–1.27)
Globulin, Total: 2.3 g/dL (ref 1.5–4.5)
Glucose: 225 mg/dL — ABNORMAL HIGH (ref 70–99)
Potassium: 4.2 mmol/L (ref 3.5–5.2)
Sodium: 138 mmol/L (ref 134–144)
Total Protein: 6.5 g/dL (ref 6.0–8.5)
eGFR: 103 mL/min/{1.73_m2} (ref >59)

## 2022-12-16 LAB — LIPID PANEL W/O CHOL/HDL RATIO
Cholesterol, Total: 207 mg/dL — ABNORMAL HIGH (ref 100–199)
HDL: 46 mg/dL (ref >39)
LDL Chol Calc (NIH): 127 mg/dL — ABNORMAL HIGH (ref 0–99)
Triglycerides: 189 mg/dL — ABNORMAL HIGH (ref 0–149)
VLDL Cholesterol Cal: 34 mg/dL (ref 5–40)

## 2022-12-16 LAB — PSA: Prostate Specific Ag, Serum: 1 ng/mL (ref 0.0–4.0)

## 2022-12-16 LAB — TSH: TSH: 1.71 u[IU]/mL (ref 0.450–4.500)

## 2023-01-28 ENCOUNTER — Ambulatory Visit: Payer: Commercial Managed Care - PPO | Admitting: Family Medicine

## 2023-03-29 ENCOUNTER — Ambulatory Visit: Payer: Commercial Managed Care - PPO | Admitting: Family Medicine

## 2023-03-29 ENCOUNTER — Telehealth: Payer: Self-pay

## 2023-03-29 ENCOUNTER — Telehealth: Payer: Self-pay | Admitting: Family Medicine

## 2023-03-29 ENCOUNTER — Encounter: Payer: Self-pay | Admitting: Family Medicine

## 2023-03-29 VITALS — BP 135/82 | HR 56 | Ht 71.0 in | Wt 224.0 lb

## 2023-03-29 DIAGNOSIS — G4452 New daily persistent headache (NDPH): Secondary | ICD-10-CM | POA: Diagnosis not present

## 2023-03-29 DIAGNOSIS — F419 Anxiety disorder, unspecified: Secondary | ICD-10-CM | POA: Diagnosis not present

## 2023-03-29 DIAGNOSIS — Z7985 Long-term (current) use of injectable non-insulin antidiabetic drugs: Secondary | ICD-10-CM

## 2023-03-29 DIAGNOSIS — E1165 Type 2 diabetes mellitus with hyperglycemia: Secondary | ICD-10-CM | POA: Diagnosis not present

## 2023-03-29 LAB — BAYER DCA HB A1C WAIVED: HB A1C (BAYER DCA - WAIVED): 11 % — ABNORMAL HIGH (ref 4.8–5.6)

## 2023-03-29 MED ORDER — KETOROLAC TROMETHAMINE 60 MG/2ML IM SOLN
60.0000 mg | Freq: Once | INTRAMUSCULAR | Status: AC
Start: 2023-03-29 — End: 2023-03-29
  Administered 2023-03-29: 60 mg via INTRAMUSCULAR

## 2023-03-29 MED ORDER — TIRZEPATIDE 2.5 MG/0.5ML ~~LOC~~ SOAJ: 2.5000 mg | SUBCUTANEOUS | 1 refills | Status: DC

## 2023-03-29 NOTE — Assessment & Plan Note (Signed)
Doing well off medicine currently. Will continue to monitor. Call if starts getting worse again. Recheck about 6 weeks.

## 2023-03-29 NOTE — Telephone Encounter (Signed)
Called patient to let him know that we got a sample of mounjaro in- OK for him to come pick up. Signed out and in the fridge for him

## 2023-03-29 NOTE — Telephone Encounter (Signed)
-----   Message from Olevia Perches sent at 03/29/2023  8:27 AM EST ----- Eye exam in October- [facility]

## 2023-03-29 NOTE — Telephone Encounter (Signed)
Patient most recent Diabetic Eye Exam at today's visit.

## 2023-03-29 NOTE — Telephone Encounter (Signed)
Left message for patient. OK for [facility] to review when he returns call.

## 2023-03-29 NOTE — Assessment & Plan Note (Signed)
Likely the cause of the paresthesias and polydipsia. Not doing great with A1c of 11.0. Will start him on mounjaro and recheck in about 6 weeks. Call with any GI side effects.

## 2023-03-29 NOTE — Progress Notes (Signed)
Patient presented to clinic for a provider visit where based on provider order patient received a 60 mg ketorolac injection to the left quad. Patient tolerated well, no complications encountered.

## 2023-03-29 NOTE — Progress Notes (Signed)
BP 135/82   Pulse (!) 56   Ht 5\' 11"  (1.803 m)   Wt 224 lb (101.6 kg)   SpO2 98%   BMI 31.24 kg/m    Subjective:    Patient ID: Glenn Flynn, male    DOB: December 03, 1970, 52 y.o.   MRN: [id number]  HPI: Glenn Flynn is a 52 y.o. male  Chief Complaint  Patient presents with   Diabetes    Patient says since the start of his new medication change to [place]. Patient says he notices he stays thirsty. Patient says his fluid intake is about the same and he is urinated after his fluids.    Anxiety    Patient says he has completely stopped both of Wellbutrin and Lexapro. Patient says he should have not stopped them together, but patient says he just did not feel like himself. Patient says he does noticed some times he becomes overwhelmed. Patient stopped about two weeks ago.    Headache    Patient says he has been noticing blurred visions, but he has been waking up with headaches and lasting throughout the day, and says he has recently been sick with a cold.    Woke up with a headache about 6 days ago and has been waking up with headaches for the past several days. Did have a cold, but that seems to have gone away, headaches have continued.   DIABETES Hypoglycemic episodes:no Polydipsia/polyuria: yes Visual disturbance: yes- with migraine Chest pain: no Paresthesias: yes Glucose Monitoring: no Taking Insulin?: no Blood Pressure Monitoring: rarely Retinal Examination: Up to Date Foot Exam: Up to Date Diabetic Education: Completed Pneumovax: Up to Date Influenza: Up to Date Aspirin: no  ANXIETY/DEPRESSION- stopped all his medicine about 2 weeks ago, he was feeling blunted on his medicine, but he does note that his anxiety has been up a bit.  Duration: chronic Status:better Anxious mood: yes  Excessive worrying: no Irritability: no  Sweating: no Nausea: no Palpitations:no Hyperventilation: no Panic attacks: no Agoraphobia: no  Obscessions/compulsions:  no Depressed mood: no    03/29/2023    8:13 AM 12/15/2022    8:09 AM 09/01/2022    8:15 AM 07/31/2022   10:19 AM 02/20/2022    8:44 AM  Depression screen PHQ 2/9  Decreased Interest 1 1 0 1 1  Down, Depressed, Hopeless 0 1 0 0 0  PHQ - 2 Score 1 2 0 1 1  Altered sleeping 0 0 1 0 1  Tired, decreased energy 1 1 1 1  0  Change in appetite 0 0 0 0 0  Feeling bad or failure about yourself  0 0 0 0 0  Trouble concentrating 0 0 0 0 0  Moving slowly or fidgety/restless 0 0 0 0 0  Suicidal thoughts 0 0 0 0 0  PHQ-9 Score 2 3 2 2 2   Difficult doing work/chores Not difficult at all Not difficult at all  Not difficult at all       03/29/2023    8:13 AM 12/15/2022    8:09 AM 09/01/2022    8:15 AM 07/31/2022   10:20 AM  GAD 7 : Generalized Anxiety Score  Nervous, Anxious, on Edge 1 1 1 1   Control/stop worrying 0 0 0 0  Worry too much - different things 0 0 0 0  Trouble relaxing 0 0 0 0  Restless 0 0 0 0  Easily annoyed or irritable 1 0 0 0  Afraid - awful might happen 0  0 0 0  Total GAD 7 Score 2 1 1 1   Anxiety Difficulty Not difficult at all Not difficult at all Not difficult at all Not difficult at all   Anhedonia: no Weight changes: no Insomnia: no   Hypersomnia: no Fatigue/loss of energy: no Feelings of worthlessness: no Feelings of guilt: no Impaired concentration/indecisiveness: no Suicidal ideations: no  Crying spells: no Recent Stressors/Life Changes: no   Relationship problems: no   Family stress: no     Financial stress: no    Job stress: no    Recent death/loss: no   Relevant past medical, surgical, family and social history reviewed and updated as indicated. Interim medical history since our last visit reviewed. Allergies and medications reviewed and updated.  Review of Systems  Constitutional:  Positive for fatigue. Negative for activity change, appetite change, chills, diaphoresis, fever and unexpected weight change.  HENT: Negative.    Respiratory: Negative.     Cardiovascular: Negative.   Gastrointestinal:  Positive for diarrhea and nausea. Negative for abdominal distention, abdominal pain, anal bleeding, blood in stool, constipation, rectal pain and vomiting.  Neurological:  Positive for headaches. Negative for dizziness, tremors, seizures, syncope, facial asymmetry, speech difficulty, weakness, light-headedness and numbness.  Psychiatric/Behavioral:  Negative for agitation, behavioral problems, confusion, decreased concentration, dysphoric mood, hallucinations, self-injury, sleep disturbance and suicidal ideas. The patient is nervous/anxious. The patient is not hyperactive.     Per HPI unless specifically indicated above     Objective:    BP 135/82   Pulse (!) 56   Ht 5\' 11"  (1.803 m)   Wt 224 lb (101.6 kg)   SpO2 98%   BMI 31.24 kg/m   Wt Readings from Last 3 Encounters:  03/29/23 224 lb (101.6 kg)  12/15/22 223 lb 6.4 oz (101.3 kg)  09/01/22 228 lb 12.8 oz (103.8 kg)    Physical Exam Vitals and nursing note reviewed.  Constitutional:      General: He is not in acute distress.    Appearance: Normal appearance. He is not ill-appearing, toxic-appearing or diaphoretic.  HENT:     Head: Normocephalic and atraumatic.     Right Ear: External ear normal.     Left Ear: External ear normal.     Nose: Nose normal.     Mouth/Throat:     Mouth: Mucous membranes are moist.     Pharynx: Oropharynx is clear.  Eyes:     General: No scleral icterus.       Right eye: No discharge.        Left eye: No discharge.     Extraocular Movements: Extraocular movements intact.     Conjunctiva/sclera: Conjunctivae normal.     Pupils: Pupils are equal, round, and reactive to light.  Cardiovascular:     Rate and Rhythm: Normal rate and regular rhythm.     Pulses: Normal pulses.     Heart sounds: Normal heart sounds. No murmur heard.    No friction rub. No gallop.  Pulmonary:     Effort: Pulmonary effort is normal. No respiratory distress.     Breath  sounds: Normal breath sounds. No stridor. No wheezing, rhonchi or rales.  Chest:     Chest wall: No tenderness.  Musculoskeletal:        General: Normal range of motion.     Cervical back: Normal range of motion and neck supple.  Skin:    General: Skin is warm and dry.     Capillary Refill: Capillary refill takes  less than 2 seconds.     Coloration: Skin is not jaundiced or pale.     Findings: No bruising, erythema, lesion or rash.  Neurological:     General: No focal deficit present.     Mental Status: He is alert and oriented to person, place, and time. Mental status is at baseline.  Psychiatric:        Mood and Affect: Mood normal.        Behavior: Behavior normal.        Thought Content: Thought content normal.        Judgment: Judgment normal.     Results for orders placed or performed in visit on 03/29/23  Bayer DCA Hb A1c Waived  Result Value Ref Range   HB A1C (BAYER DCA - WAIVED) 11.0 (H) 4.8 - 5.6 %      Assessment & Plan:   Problem List Items Addressed This Visit       Endocrine   Type 2 diabetes mellitus with hyperglycemia (HCC) - Primary    Likely the cause of the paresthesias and polydipsia. Not doing great with A1c of 11.0. Will start him on mounjaro and recheck in about 6 weeks. Call with any GI side effects.       Relevant Medications   tirzepatide (MOUNJARO) 2.5 MG/0.5ML Pen   Other Relevant Orders   Bayer DCA Hb A1c Waived (Completed)     Other   Anxiety    Doing well off medicine currently. Will continue to monitor. Call if starts getting worse again. Recheck about 6 weeks.       Other Visit Diagnoses     New daily persistent headache       Likely multifactorial. Will treat with toradol shot today and get his sugars under better control and more time off SSRI. Recheck in 6 weeks. Call if worsening.   Relevant Medications   ketorolac (TORADOL) injection 60 mg        Follow up plan: Return in about 6 weeks (around 05/10/2023).

## 2023-03-30 NOTE — Telephone Encounter (Signed)
Spoke with patient. He actually filled his rx yesterday before realizing he had a voicemail from the office. He does not need this sample and thus it has been returned to supply.

## 2023-04-06 ENCOUNTER — Encounter: Payer: Self-pay | Admitting: Family Medicine

## 2023-04-06 NOTE — Telephone Encounter (Signed)
 Care team updated and letter sent for eye exam notes.

## 2023-04-09 ENCOUNTER — Other Ambulatory Visit: Payer: Self-pay | Admitting: Family Medicine

## 2023-04-12 NOTE — Telephone Encounter (Signed)
Requested medication (s) are due for refill today - yes  Requested medication (s) are on the active medication list -yes  Future visit scheduled -yes  Last refill: 12/15/22 #30  Notes to clinic: non delegated Rx  Requested Prescriptions  Pending Prescriptions Disp Refills   ALPRAZolam (XANAX) 0.5 MG tablet [Pharmacy Med Name: ALPRAZOLAM 0.5 MG TABLET] 30 tablet 0    Sig: TAKE 1 TABLET BY MOUTH 2 TIMES DAILY AS NEEDED FOR ANXIETY.     Not Delegated - Psychiatry: Anxiolytics/Hypnotics 2 Failed - 04/12/2023 12:01 PM      Failed - This refill cannot be delegated      Failed - Urine Drug Screen completed in last 360 days      Passed - Patient is not pregnant      Passed - Valid encounter within last 6 months    Recent Outpatient Visits           2 weeks ago Type 2 diabetes mellitus with hyperglycemia, without long-term current use of insulin (HCC)   [place] [facility] [place], Megan P, DO   3 months ago Routine general medical examination at a health care facility   [facility], [place] P, DO   7 months ago Type 2 diabetes mellitus with hyperglycemia, without long-term current use of insulin [facility])   [place] [place], Megan P, DO   8 months ago Diarrhea, unspecified type   [place] [facility] Pearley, Sherran Needs, NP   8 months ago Diarrhea, unspecified type   [place] [facility] [place], Sherran Needs, NP       Future Appointments             In 1 month Laural Benes, Oralia Rud, DO [place] [facility]               Requested Prescriptions  Pending Prescriptions Disp Refills   ALPRAZolam (XANAX) 0.5 MG tablet [Pharmacy Med Name: ALPRAZOLAM 0.5 MG TABLET] 30 tablet 0    Sig: TAKE 1 TABLET BY MOUTH 2 TIMES DAILY AS NEEDED FOR ANXIETY.     Not Delegated - Psychiatry: Anxiolytics/Hypnotics 2 Failed - 04/12/2023 12:01 PM      Failed - This  refill cannot be delegated      Failed - Urine Drug Screen completed in last 360 days      Passed - Patient is not pregnant      Passed - Valid encounter within last 6 months    Recent Outpatient Visits           2 weeks ago Type 2 diabetes mellitus with hyperglycemia, without long-term current use of insulin (HCC)   [place] [place], Megan P, DO   3 months ago Routine general medical examination at a health care facility   [facility], [place] P, DO   7 months ago Type 2 diabetes mellitus with hyperglycemia, without long-term current use of insulin [facility])   [place] [facility] [place], Megan P, DO   8 months ago Diarrhea, unspecified type   [place] [facility] Pearley, Sherran Needs, NP   8 months ago Diarrhea, unspecified type   [place], Sherran Needs, NP       Future Appointments             In 1 month Laural Benes, Oralia Rud, DO  [facility]

## 2023-04-15 ENCOUNTER — Other Ambulatory Visit: Payer: Self-pay | Admitting: Family Medicine

## 2023-04-15 MED ORDER — ALPRAZOLAM 0.5 MG PO TABS: 0.5000 mg | ORAL_TABLET | Freq: Two times a day (BID) | ORAL | 0 refills | Status: DC | PRN

## 2023-04-16 NOTE — Telephone Encounter (Signed)
Requested Prescriptions  Pending Prescriptions Disp Refills   empagliflozin (JARDIANCE) 25 MG TABS tablet [Pharmacy Med Name: JARDIANCE 25 MG TABLET] 30 tablet 0    Sig: TAKE 1 TABLET BY MOUTH DAILY BEFORE BREAKFAST.     Endocrinology:  Diabetes - SGLT2 Inhibitors Failed - 04/16/2023 10:22 AM      Failed - HBA1C is between 0 and 7.9 and within 180 days    Hemoglobin A1C  Date Value Ref Range Status  04/16/2016 8.4  Final   HB A1C (BAYER DCA - WAIVED)  Date Value Ref Range Status  03/29/2023 11.0 (H) 4.8 - 5.6 % Final    Comment:             Prediabetes: 5.7 - 6.4          Diabetes: >6.4          Glycemic control for adults with diabetes: <7.0          Passed - Cr in normal range and within 360 days    Creatinine, Ser  Date Value Ref Range Status  12/15/2022 0.88 0.76 - 1.27 mg/dL Final         Passed - eGFR in normal range and within 360 days    GFR calc Af Amer  Date Value Ref Range Status  12/27/2019 117 >59 mL/min/1.73 Final    Comment:    **[facility] currently reports eGFR in compliance with the current**   recommendations of the [employer]. [facility] will   update reporting as new guidelines are published from the NKF-ASN   Task force.    GFR calc non Af Amer  Date Value Ref Range Status  12/27/2019 102 >59 mL/min/1.73 Final   eGFR  Date Value Ref Range Status  12/15/2022 103 >59 mL/min/1.73 Final         Passed - Valid encounter within last 6 months    Recent Outpatient Visits           2 weeks ago Type 2 diabetes mellitus with hyperglycemia, without long-term current use of insulin (HCC)   [place] [facility] [place], Megan P, DO   4 months ago Routine general medical examination at a health care facility   [facility], [place] P, DO   7 months ago Type 2 diabetes mellitus with hyperglycemia, without long-term current use of insulin [facility])   [facility] [place], Megan P,  DO   8 months ago Diarrhea, unspecified type   [place] [facility] Pearley, Sherran Needs, NP   8 months ago Diarrhea, unspecified type   [place] [facility], Sherran Needs, NP       Future Appointments             In 3 weeks Laural Benes, Oralia Rud, DO [place] [facility]

## 2023-05-13 ENCOUNTER — Ambulatory Visit (INDEPENDENT_AMBULATORY_CARE_PROVIDER_SITE_OTHER): Payer: PRIVATE HEALTH INSURANCE | Admitting: Family Medicine

## 2023-05-13 ENCOUNTER — Encounter: Payer: Self-pay | Admitting: Family Medicine

## 2023-05-13 VITALS — BP 121/77 | HR 81 | Temp 98.3°F | Wt 219.0 lb

## 2023-05-13 DIAGNOSIS — Z7985 Long-term (current) use of injectable non-insulin antidiabetic drugs: Secondary | ICD-10-CM | POA: Diagnosis not present

## 2023-05-13 DIAGNOSIS — E1165 Type 2 diabetes mellitus with hyperglycemia: Secondary | ICD-10-CM

## 2023-05-13 DIAGNOSIS — F419 Anxiety disorder, unspecified: Secondary | ICD-10-CM | POA: Diagnosis not present

## 2023-05-13 DIAGNOSIS — K219 Gastro-esophageal reflux disease without esophagitis: Secondary | ICD-10-CM

## 2023-05-13 MED ORDER — OMEPRAZOLE 20 MG PO CPDR: 20.0000 mg | DELAYED_RELEASE_CAPSULE | Freq: Every day | ORAL | 1 refills | Status: DC

## 2023-05-13 MED ORDER — ALPRAZOLAM 0.5 MG PO TABS: 0.5000 mg | ORAL_TABLET | Freq: Two times a day (BID) | ORAL | 0 refills | Status: DC | PRN

## 2023-05-13 MED ORDER — ATORVASTATIN CALCIUM 80 MG PO TABS: 80.0000 mg | ORAL_TABLET | Freq: Every day | ORAL | 1 refills | Status: DC

## 2023-05-13 MED ORDER — TRAZODONE HCL 100 MG PO TABS: ORAL_TABLET | ORAL | 1 refills | Status: DC

## 2023-05-13 MED ORDER — METFORMIN HCL ER 500 MG PO TB24: ORAL_TABLET | ORAL | 1 refills | Status: DC

## 2023-05-13 MED ORDER — EMPAGLIFLOZIN 25 MG PO TABS: 25.0000 mg | ORAL_TABLET | Freq: Every day | ORAL | 1 refills | Status: DC

## 2023-05-13 MED ORDER — BUPROPION HCL ER (XL) 150 MG PO TB24: 150.0000 mg | ORAL_TABLET | Freq: Every day | ORAL | 1 refills | Status: DC

## 2023-05-13 MED ORDER — TIRZEPATIDE 5 MG/0.5ML ~~LOC~~ SOAJ: 5.0000 mg | SUBCUTANEOUS | 1 refills | Status: DC

## 2023-05-13 NOTE — Assessment & Plan Note (Signed)
Tolerating his mounjaro well. Will increase his mounjaro to 5mg  and recheck in 6 weeks. Call with any concerns.

## 2023-05-13 NOTE — Assessment & Plan Note (Signed)
Under good control on current regimen. Continue current regimen. Continue to monitor. Call with any concerns. Refills given.   

## 2023-05-13 NOTE — Progress Notes (Signed)
BP 121/77   Pulse 81   Temp 98.3 F (36.8 C) (Oral)   Wt 219 lb (99.3 kg)   SpO2 97%   BMI 30.54 kg/m    Subjective:    Patient ID: Glenn Flynn, male    DOB: 1970/09/18, 53 y.o.   MRN: [id number]  HPI: [name] is a 53 y.o. male  Chief Complaint  Patient presents with   Diabetes    Patient most recent Diabetic Eye Exam was requested at today's visit.    DIABETES Hypoglycemic episodes:no Polydipsia/polyuria: no Visual disturbance: no Chest pain: no Paresthesias: no Glucose Monitoring: no  Accucheck frequency: Not Checking Taking Insulin?: no Blood Pressure Monitoring: not checking Retinal Examination: Up to Date Foot Exam: Up to Date Diabetic Education: Completed Pneumovax: Up to Date Influenza: Up to Date Aspirin: no  ANXIETY/STRESS- had to restart his wellbutrin. Doing much better.  Duration: chronic Status:better Anxious mood: yes  Excessive worrying: yes Irritability: no  Sweating: no Nausea: no Palpitations:no Hyperventilation: no Panic attacks: no Agoraphobia: no  Obscessions/compulsions: no Depressed mood: no    05/13/2023    8:31 AM 03/29/2023    8:13 AM 12/15/2022    8:09 AM 09/01/2022    8:15 AM 07/31/2022   10:19 AM  Depression screen PHQ 2/9  Decreased Interest 0 1 1 0 1  Down, Depressed, Hopeless 1 0 1 0 0  PHQ - 2 Score 1 1 2  0 1  Altered sleeping 0 0 0 1 0  Tired, decreased energy 1 1 1 1 1   Change in appetite 0 0 0 0 0  Feeling bad or failure about yourself  0 0 0 0 0  Trouble concentrating 0 0 0 0 0  Moving slowly or fidgety/restless 0 0 0 0 0  Suicidal thoughts 0 0 0 0 0  PHQ-9 Score 2 2 3 2 2   Difficult doing work/chores Not difficult at all Not difficult at all Not difficult at all  Not difficult at all      05/13/2023    8:31 AM 03/29/2023    8:13 AM 12/15/2022    8:09 AM 09/01/2022    8:15 AM  GAD 7 : Generalized Anxiety Score  Nervous, Anxious, on Edge 1 1 1 1   Control/stop worrying 0 0 0 0  Worry too  much - different things 0 0 0 0  Trouble relaxing 0 0 0 0  Restless 0 0 0 0  Easily annoyed or irritable 0 1 0 0  Afraid - awful might happen 0 0 0 0  Total GAD 7 Score 1 2 1 1   Anxiety Difficulty Not difficult at all Not difficult at all Not difficult at all Not difficult at all   Anhedonia: no Weight changes: no Insomnia: no   Hypersomnia: no Fatigue/loss of energy: no Feelings of worthlessness: no Feelings of guilt: no Impaired concentration/indecisiveness: no Suicidal ideations: no  Crying spells: no Recent Stressors/Life Changes: no   Relationship problems: no   Family stress: no     Financial stress: no    Job stress: no    Recent death/loss: no   Relevant past medical, surgical, family and social history reviewed and updated as indicated. Interim medical history since our last visit reviewed. Allergies and medications reviewed and updated.  Review of Systems  Constitutional: Negative.   Respiratory: Negative.    Cardiovascular: Negative.   Musculoskeletal: Negative.   Neurological: Negative.   Psychiatric/Behavioral: Negative.      Per HPI  unless specifically indicated above     Objective:    BP 121/77   Pulse 81   Temp 98.3 F (36.8 C) (Oral)   Wt 219 lb (99.3 kg)   SpO2 97%   BMI 30.54 kg/m   Wt Readings from Last 3 Encounters:  05/13/23 219 lb (99.3 kg)  03/29/23 224 lb (101.6 kg)  12/15/22 223 lb 6.4 oz (101.3 kg)    Physical Exam Vitals and nursing note reviewed.  Constitutional:      General: He is not in acute distress.    Appearance: Normal appearance. He is not ill-appearing, toxic-appearing or diaphoretic.  HENT:     Head: Normocephalic and atraumatic.     Right Ear: External ear normal.     Left Ear: External ear normal.     Nose: Nose normal.     Mouth/Throat:     Mouth: Mucous membranes are moist.     Pharynx: Oropharynx is clear.  Eyes:     General: No scleral icterus.       Right eye: No discharge.        Left eye: No  discharge.     Extraocular Movements: Extraocular movements intact.     Conjunctiva/sclera: Conjunctivae normal.     Pupils: Pupils are equal, round, and reactive to light.  Cardiovascular:     Rate and Rhythm: Normal rate and regular rhythm.     Pulses: Normal pulses.     Heart sounds: Normal heart sounds. No murmur heard.    No friction rub. No gallop.  Pulmonary:     Effort: Pulmonary effort is normal. No respiratory distress.     Breath sounds: Normal breath sounds. No stridor. No wheezing, rhonchi or rales.  Chest:     Chest wall: No tenderness.  Musculoskeletal:        General: Normal range of motion.     Cervical back: Normal range of motion and neck supple.  Skin:    General: Skin is warm and dry.     Capillary Refill: Capillary refill takes less than 2 seconds.     Coloration: Skin is not jaundiced or pale.     Findings: No bruising, erythema, lesion or rash.  Neurological:     General: No focal deficit present.     Mental Status: He is alert and oriented to person, place, and time. Mental status is at baseline.  Psychiatric:        Mood and Affect: Mood normal.        Behavior: Behavior normal.        Thought Content: Thought content normal.        Judgment: Judgment normal.     Results for orders placed or performed in visit on 03/29/23  Bayer DCA Hb A1c Waived   Collection Time: 03/29/23  8:16 AM  Result Value Ref Range   HB A1C (BAYER DCA - WAIVED) 11.0 (H) 4.8 - 5.6 %      Assessment & Plan:   Problem List Items Addressed This Visit       Digestive   Gastroesophageal reflux disease without esophagitis   Under good control on current regimen. Continue current regimen. Continue to monitor. Call with any concerns. Refills given.        Relevant Medications   omeprazole (PRILOSEC) 20 MG capsule     Endocrine   Type 2 diabetes mellitus with hyperglycemia (HCC) - Primary   Tolerating his mounjaro well. Will increase his mounjaro to 5mg  and recheck  in 6  weeks. Call with any concerns.       Relevant Medications   tirzepatide (MOUNJARO) 5 MG/0.5ML Pen   metFORMIN (GLUCOPHAGE-XR) 500 MG 24 hr tablet   empagliflozin (JARDIANCE) 25 MG TABS tablet   atorvastatin (LIPITOR) 80 MG tablet     Other   Anxiety   Doing better after restarting his wellbutrin. Recheck 6 weeks. Call with any concerns.       Relevant Medications   buPROPion (WELLBUTRIN XL) 150 MG 24 hr tablet   traZODone (DESYREL) 100 MG tablet   ALPRAZolam (XANAX) 0.5 MG tablet     Follow up plan: Return in about 6 weeks (around 06/24/2023).

## 2023-05-13 NOTE — Assessment & Plan Note (Signed)
Doing better after restarting his wellbutrin. Recheck 6 weeks. Call with any concerns.

## 2023-06-17 ENCOUNTER — Encounter: Payer: Self-pay | Admitting: Family Medicine

## 2023-06-17 MED ORDER — TIRZEPATIDE 2.5 MG/0.5ML ~~LOC~~ SOAJ: 2.5000 mg | SUBCUTANEOUS | 1 refills | Status: DC

## 2023-07-02 ENCOUNTER — Ambulatory Visit: Payer: Commercial Managed Care - PPO | Admitting: Family Medicine

## 2023-07-02 ENCOUNTER — Encounter: Payer: Self-pay | Admitting: Family Medicine

## 2023-07-02 VITALS — BP 108/70 | HR 71 | Temp 98.4°F | Resp 16 | Ht 70.98 in | Wt 213.6 lb

## 2023-07-02 DIAGNOSIS — E1165 Type 2 diabetes mellitus with hyperglycemia: Secondary | ICD-10-CM

## 2023-07-02 DIAGNOSIS — E782 Mixed hyperlipidemia: Secondary | ICD-10-CM | POA: Diagnosis not present

## 2023-07-02 DIAGNOSIS — Z7985 Long-term (current) use of injectable non-insulin antidiabetic drugs: Secondary | ICD-10-CM | POA: Diagnosis not present

## 2023-07-02 DIAGNOSIS — Z1159 Encounter for screening for other viral diseases: Secondary | ICD-10-CM

## 2023-07-02 LAB — BAYER DCA HB A1C WAIVED: HB A1C (BAYER DCA - WAIVED): 7.9 % — ABNORMAL HIGH (ref 4.8–5.6)

## 2023-07-02 NOTE — Assessment & Plan Note (Signed)
 Doing great with A1c of 7.9 down from 11! Unable to tolerate 5mg - will keep him on the 2.5mg  and see if nausea eases. Recheck 3 months. Call with any concerns.

## 2023-07-02 NOTE — Assessment & Plan Note (Signed)
 Under good control on current regimen. Continue current regimen. Continue to monitor. Call with any concerns. Refills given. Labs drawn today.

## 2023-07-02 NOTE — Progress Notes (Signed)
 BP 108/70 (BP Location: Left Arm, Patient Position: Sitting, Cuff Size: Normal)   Pulse 71   Temp 98.4 F (36.9 C) (Oral)   Resp 16   Ht 5' 10.98" (1.803 m)   Wt 213 lb 9.6 oz (96.9 kg)   SpO2 98%   BMI 29.80 kg/m    Subjective:    Patient ID: Glenn Flynn, male    DOB: 1970-12-28, 53 y.o.   MRN: [id number]  HPI: Glenn Flynn is a 53 y.o. male  Chief Complaint  Patient presents with   Diabetes    2.5 mg still some nausea but nothing like on the higher dose of 5 mg.    DIABETES- was unable to tolerate the nausea on the 5mg  of the mounjaro Hypoglycemic episodes:no Polydipsia/polyuria: no Visual disturbance: no Chest pain: yes- R sided during the day occasionally Paresthesias: no Glucose Monitoring: yes  Accucheck frequency:  occasionally Taking Insulin?: no Blood Pressure Monitoring: not checking Retinal Examination: Up to Date Foot Exam: Up to Date Diabetic Education: Completed Pneumovax: Up to Date Influenza: Up to Date Aspirin: no  HYPERLIPIDEMIA Hyperlipidemia status: excellent compliance Satisfied with current treatment?  yes Side effects:  no Medication compliance: excellent compliance Past cholesterol meds: atorvastatin Supplements: none Aspirin:  no The 10-year ASCVD risk score (Arnett DK, et al., 2019) is: 8.1%   Values used to calculate the score:     Age: 55 years     Sex: Male     Is Non-Hispanic African American: No     Diabetic: Yes     Tobacco smoker: No     Systolic Blood Pressure: 108 mmHg     Is BP treated: No     HDL Cholesterol: 46 mg/dL     Total Cholesterol: 207 mg/dL Chest pain:  yes Coronary artery disease:  no Family history CAD:  yes  Relevant past medical, surgical, family and social history reviewed and updated as indicated. Interim medical history since our last visit reviewed. Allergies and medications reviewed and updated.  Review of Systems  Constitutional: Negative.   HENT: Negative.    Respiratory:  Negative.    Cardiovascular: Negative.   Gastrointestinal:  Positive for nausea. Negative for abdominal distention, abdominal pain, anal bleeding, blood in stool, constipation, diarrhea, rectal pain and vomiting.  Musculoskeletal: Negative.   Skin: Negative.   Psychiatric/Behavioral: Negative.      Per HPI unless specifically indicated above     Objective:    BP 108/70 (BP Location: Left Arm, Patient Position: Sitting, Cuff Size: Normal)   Pulse 71   Temp 98.4 F (36.9 C) (Oral)   Resp 16   Ht 5' 10.98" (1.803 m)   Wt 213 lb 9.6 oz (96.9 kg)   SpO2 98%   BMI 29.80 kg/m   Wt Readings from Last 3 Encounters:  07/02/23 213 lb 9.6 oz (96.9 kg)  05/13/23 219 lb (99.3 kg)  03/29/23 224 lb (101.6 kg)    Physical Exam Vitals and nursing note reviewed.  Constitutional:      General: He is not in acute distress.    Appearance: Normal appearance. He is not ill-appearing, toxic-appearing or diaphoretic.  HENT:     Head: Normocephalic and atraumatic.     Right Ear: External ear normal.     Left Ear: External ear normal.     Nose: Nose normal.     Mouth/Throat:     Mouth: Mucous membranes are moist.     Pharynx: Oropharynx is clear.  Eyes:     General: No scleral icterus.       Right eye: No discharge.        Left eye: No discharge.     Extraocular Movements: Extraocular movements intact.     Conjunctiva/sclera: Conjunctivae normal.     Pupils: Pupils are equal, round, and reactive to light.  Cardiovascular:     Rate and Rhythm: Normal rate and regular rhythm.     Pulses: Normal pulses.     Heart sounds: Normal heart sounds. No murmur heard.    No friction rub. No gallop.  Pulmonary:     Effort: Pulmonary effort is normal. No respiratory distress.     Breath sounds: Normal breath sounds. No stridor. No wheezing, rhonchi or rales.  Chest:     Chest wall: No tenderness.  Musculoskeletal:        General: Normal range of motion.     Cervical back: Normal range of motion and  neck supple.  Skin:    General: Skin is warm and dry.     Capillary Refill: Capillary refill takes less than 2 seconds.     Coloration: Skin is not jaundiced or pale.     Findings: No bruising, erythema, lesion or rash.  Neurological:     General: No focal deficit present.     Mental Status: He is alert and oriented to person, place, and time. Mental status is at baseline.  Psychiatric:        Mood and Affect: Mood normal.        Behavior: Behavior normal.        Thought Content: Thought content normal.        Judgment: Judgment normal.     Results for orders placed or performed in visit on 03/29/23  Bayer DCA Hb A1c Waived   Collection Time: 03/29/23  8:16 AM  Result Value Ref Range   HB A1C (BAYER DCA - WAIVED) 11.0 (H) 4.8 - 5.6 %      Assessment & Plan:   Problem List Items Addressed This Visit       Endocrine   Type 2 diabetes mellitus with hyperglycemia (HCC) - Primary   Doing great with A1c of 7.9 down from 11! Unable to tolerate 5mg - will keep him on the 2.5mg  and see if nausea eases. Recheck 3 months. Call with any concerns.       Relevant Orders   Bayer DCA Hb A1c Waived (STAT)   CBC with Differential/Platelet   Lipid Panel w/o Chol/HDL Ratio   Comprehensive metabolic panel     Other   Hyperlipidemia   Under good control on current regimen. Continue current regimen. Continue to monitor. Call with any concerns. Refills given. Labs drawn today.        Long-term (current) use of injectable non-insulin antidiabetic drugs   Other Visit Diagnoses       Need for hepatitis C screening test       Labs drawn today. Await results.   Relevant Orders   Hepatitis C Antibody        Follow up plan: Return in about 3 months (around 10/02/2023).

## 2023-07-03 LAB — LIPID PANEL W/O CHOL/HDL RATIO
Cholesterol, Total: 180 mg/dL (ref 100–199)
HDL: 41 mg/dL (ref >39)
LDL Chol Calc (NIH): 106 mg/dL — ABNORMAL HIGH (ref 0–99)
Triglycerides: 192 mg/dL — ABNORMAL HIGH (ref 0–149)
VLDL Cholesterol Cal: 33 mg/dL (ref 5–40)

## 2023-07-03 LAB — COMPREHENSIVE METABOLIC PANEL
ALT: 21 IU/L (ref 0–44)
AST: 13 IU/L (ref 0–40)
Albumin: 4.3 g/dL (ref 3.8–4.9)
Alkaline Phosphatase: 71 IU/L (ref 44–121)
BUN/Creatinine Ratio: 26 — ABNORMAL HIGH (ref 9–20)
BUN: 22 mg/dL (ref 6–24)
Bilirubin Total: 0.7 mg/dL (ref 0.0–1.2)
CO2: 23 mmol/L (ref 20–29)
Calcium: 9.3 mg/dL (ref 8.7–10.2)
Chloride: 103 mmol/L (ref 96–106)
Creatinine, Ser: 0.84 mg/dL (ref 0.76–1.27)
Globulin, Total: 2 g/dL (ref 1.5–4.5)
Glucose: 164 mg/dL — ABNORMAL HIGH (ref 70–99)
Potassium: 4.9 mmol/L (ref 3.5–5.2)
Sodium: 140 mmol/L (ref 134–144)
Total Protein: 6.3 g/dL (ref 6.0–8.5)
eGFR: 104 mL/min/{1.73_m2} (ref >59)

## 2023-07-03 LAB — CBC WITH DIFFERENTIAL/PLATELET
Basophils Absolute: 0 10*3/uL (ref 0.0–0.2)
Basos: 0 %
EOS (ABSOLUTE): 0.6 10*3/uL — ABNORMAL HIGH (ref 0.0–0.4)
Eos: 8 %
Hematocrit: 46.6 % (ref 37.5–51.0)
Hemoglobin: 15.2 g/dL (ref 13.0–17.7)
Immature Grans (Abs): 0 10*3/uL (ref 0.0–0.1)
Immature Granulocytes: 0 %
Lymphocytes Absolute: 1.7 10*3/uL (ref 0.7–3.1)
Lymphs: 25 %
MCH: 29.9 pg (ref 26.6–33.0)
MCHC: 32.6 g/dL (ref 31.5–35.7)
MCV: 92 fL (ref 79–97)
Monocytes Absolute: 0.5 10*3/uL (ref 0.1–0.9)
Monocytes: 7 %
Neutrophils Absolute: 4 10*3/uL (ref 1.4–7.0)
Neutrophils: 60 %
Platelets: 349 10*3/uL (ref 150–450)
RBC: 5.08 x10E6/uL (ref 4.14–5.80)
RDW: 13.1 % (ref 11.6–15.4)
WBC: 6.9 10*3/uL (ref 3.4–10.8)

## 2023-07-03 LAB — HEPATITIS C ANTIBODY: Hep C Virus Ab: NONREACTIVE

## 2023-07-07 ENCOUNTER — Other Ambulatory Visit: Payer: Self-pay | Admitting: Family Medicine

## 2023-07-08 NOTE — Telephone Encounter (Signed)
 Requested medication (s) are due for refill today- yes  Requested medication (s) are on the active medication list -yes  Future visit scheduled -no  Last refill: 05/13/23 #20  Notes to clinic: non delegated Rx  Requested Prescriptions  Pending Prescriptions Disp Refills   ALPRAZolam (XANAX) 0.5 MG tablet [Pharmacy Med Name: ALPRAZOLAM 0.5 MG TABLET] 20 tablet 0    Sig: TAKE 1 TABLET BY MOUTH 2 TIMES DAILY AS NEEDED FOR ANXIETY.     Not Delegated - Psychiatry: Anxiolytics/Hypnotics 2 Failed - 07/08/2023  8:39 AM      Failed - This refill cannot be delegated      Failed - Urine Drug Screen completed in last 360 days      Passed - Patient is not pregnant      Passed - Valid encounter within last 6 months    Recent Outpatient Visits           1 month ago Type 2 diabetes mellitus with hyperglycemia, without long-term current use of insulin (HCC)   [place] [facility] [place], Megan P, DO   3 months ago Type 2 diabetes mellitus with hyperglycemia, without long-term current use of insulin (HCC)   [place] [facility] [place], Megan P, DO   6 months ago Routine general medical examination at a health care facility   [facility], [place] P, DO   10 months ago Type 2 diabetes mellitus with hyperglycemia, without long-term current use of insulin [facility])   [place] [facility], Megan P, DO   11 months ago Diarrhea, unspecified type   [place] [facility] [place], Sherran Needs, NP                 Requested Prescriptions  Pending Prescriptions Disp Refills   ALPRAZolam (XANAX) 0.5 MG tablet [Pharmacy Med Name: ALPRAZOLAM 0.5 MG TABLET] 20 tablet 0    Sig: TAKE 1 TABLET BY MOUTH 2 TIMES DAILY AS NEEDED FOR ANXIETY.     Not Delegated - Psychiatry: Anxiolytics/Hypnotics 2 Failed - 07/08/2023  8:39 AM      Failed - This refill cannot be delegated      Failed - Urine Drug Screen  completed in last 360 days      Passed - Patient is not pregnant      Passed - Valid encounter within last 6 months    Recent Outpatient Visits           1 month ago Type 2 diabetes mellitus with hyperglycemia, without long-term current use of insulin (HCC)   [place] [facility] [place], Megan P, DO   3 months ago Type 2 diabetes mellitus with hyperglycemia, without long-term current use of insulin (HCC)   [place] [facility] [place], Megan P, DO   6 months ago Routine general medical examination at a health care facility   [facility], [place] P, DO   10 months ago Type 2 diabetes mellitus with hyperglycemia, without long-term current use of insulin [facility])   [place] [facility] [place], Megan P, DO   11 months ago Diarrhea, unspecified type    [facility] [place] Cook[place], NP

## 2023-07-09 ENCOUNTER — Encounter: Payer: Self-pay | Admitting: Family Medicine

## 2023-07-09 ENCOUNTER — Other Ambulatory Visit: Payer: Self-pay | Admitting: Family Medicine

## 2023-09-05 ENCOUNTER — Other Ambulatory Visit: Payer: Self-pay | Admitting: Family Medicine

## 2023-09-07 NOTE — Telephone Encounter (Signed)
 Requested medication (s) are due for refill today: yes  Requested medication (s) are on the active medication list: yes  Last refill:  07/09/23 #20  Future visit scheduled: yes  Notes to clinic:  Med not delegated to NT to RF   Requested Prescriptions  Pending Prescriptions Disp Refills   ALPRAZolam  (XANAX ) 0.5 MG tablet [Pharmacy Med Name: ALPRAZOLAM  0.5 MG TABLET] 20 tablet 0    Sig: TAKE 1 TABLET BY MOUTH TWICE A DAY AS NEEDED FOR ANXIETY     Not Delegated - Psychiatry: Anxiolytics/Hypnotics 2 Failed - 09/07/2023  4:28 PM      Failed - This refill cannot be delegated      Failed - Urine Drug Screen completed in last 360 days      Passed - Patient is not pregnant      Passed - Valid encounter within last 6 months    Recent Outpatient Visits           2 months ago Type 2 diabetes mellitus with hyperglycemia, without long-term current use of insulin  [facility])   [place] [facility] [place], [place], DO

## 2023-10-05 ENCOUNTER — Ambulatory Visit: Admitting: Family Medicine

## 2023-10-05 ENCOUNTER — Encounter: Payer: Self-pay | Admitting: Family Medicine

## 2023-10-05 VITALS — BP 111/77 | HR 71 | Wt 204.8 lb

## 2023-10-05 DIAGNOSIS — K219 Gastro-esophageal reflux disease without esophagitis: Secondary | ICD-10-CM

## 2023-10-05 DIAGNOSIS — E1165 Type 2 diabetes mellitus with hyperglycemia: Secondary | ICD-10-CM | POA: Diagnosis not present

## 2023-10-05 DIAGNOSIS — F419 Anxiety disorder, unspecified: Secondary | ICD-10-CM

## 2023-10-05 LAB — BAYER DCA HB A1C WAIVED: HB A1C (BAYER DCA - WAIVED): 7 % — ABNORMAL HIGH (ref 4.8–5.6)

## 2023-10-05 MED ORDER — OMEPRAZOLE 20 MG PO CPDR: 20.0000 mg | DELAYED_RELEASE_CAPSULE | Freq: Every day | ORAL | 1 refills | Status: DC

## 2023-10-05 MED ORDER — TIRZEPATIDE 2.5 MG/0.5ML ~~LOC~~ SOAJ: 2.5000 mg | SUBCUTANEOUS | 1 refills | Status: DC

## 2023-10-05 MED ORDER — METFORMIN HCL ER 500 MG PO TB24: ORAL_TABLET | ORAL | 1 refills | Status: DC

## 2023-10-05 MED ORDER — ALPRAZOLAM 0.5 MG PO TABS: 0.5000 mg | ORAL_TABLET | Freq: Two times a day (BID) | ORAL | 2 refills | Status: DC | PRN

## 2023-10-05 MED ORDER — ATORVASTATIN CALCIUM 80 MG PO TABS: 80.0000 mg | ORAL_TABLET | Freq: Every day | ORAL | 1 refills | Status: DC

## 2023-10-05 MED ORDER — TRAZODONE HCL 100 MG PO TABS: ORAL_TABLET | ORAL | 1 refills | Status: DC

## 2023-10-05 MED ORDER — BUPROPION HCL ER (XL) 150 MG PO TB24: ORAL_TABLET | ORAL | 1 refills | Status: DC

## 2023-10-05 MED ORDER — EMPAGLIFLOZIN 25 MG PO TABS: 25.0000 mg | ORAL_TABLET | Freq: Every day | ORAL | 1 refills | Status: DC

## 2023-10-05 NOTE — Progress Notes (Signed)
 BP 111/77 (BP Location: Left Arm, Patient Position: Sitting, Cuff Size: Large)   Pulse 71   Wt 204 lb 12.8 oz (92.9 kg)   SpO2 97%   BMI 28.58 kg/m    Subjective:    Patient ID: Glenn Flynn, male    DOB: Sep 14, 1970, 53 y.o.   MRN: [id number]  HPI: Glenn Flynn is a 53 y.o. male  Chief Complaint  Patient presents with   Diabetes   Anxiety   DIABETES Hypoglycemic episodes:no Polydipsia/polyuria: no Visual disturbance: no Chest pain: no Paresthesias: no Glucose Monitoring: no  Accucheck frequency: Not Checking Taking Insulin ?: no Blood Pressure Monitoring: not checking Retinal Examination: Not up to Date Foot Exam: Up to Date Diabetic Education: Completed Pneumovax: Up to Date Influenza: Up to Date Aspirin : no  ANXIETY/STRESS Duration: Chronic Status:uncontrolled Anxious mood: yes  Excessive worrying: yes Irritability: no  Sweating: no Nausea: no Palpitations:no Hyperventilation: no Panic attacks: no Agoraphobia: no  Obscessions/compulsions: no Depressed mood: yes    10/05/2023    8:41 AM 07/02/2023    8:48 AM 05/13/2023    8:31 AM 03/29/2023    8:13 AM 12/15/2022    8:09 AM  Depression screen PHQ 2/9  Decreased Interest 1 0 0 1 1  Down, Depressed, Hopeless 1 1 1  0 1  PHQ - 2 Score 2 1 1 1 2   Altered sleeping 3 1 0 0 0  Tired, decreased energy 1 1 1 1 1   Change in appetite 0 0 0 0 0  Feeling bad or failure about yourself  0 0 0 0 0  Trouble concentrating 0 0 0 0 0  Moving slowly or fidgety/restless 0 0 0 0 0  Suicidal thoughts 0 0 0 0 0  PHQ-9 Score 6 3 2 2 3   Difficult doing work/chores Not difficult at all Not difficult at all Not difficult at all Not difficult at all Not difficult at all   Anhedonia: no Weight changes: yes Insomnia: no   Hypersomnia: no Fatigue/loss of energy: no Feelings of worthlessness: no Feelings of guilt: no Impaired concentration/indecisiveness: no Suicidal ideations: no  Crying spells: no Recent  Stressors/Life Changes: no   Relationship problems: no   Family stress: no     Financial stress: no    Job stress: no    Recent death/loss: no   Relevant past medical, surgical, family and social history reviewed and updated as indicated. Interim medical history since our last visit reviewed. Allergies and medications reviewed and updated.  Review of Systems  Constitutional: Negative.   Respiratory: Negative.    Cardiovascular: Negative.   Musculoskeletal: Negative.   Neurological: Negative.   Psychiatric/Behavioral: Negative.      Per HPI unless specifically indicated above     Objective:    BP 111/77 (BP Location: Left Arm, Patient Position: Sitting, Cuff Size: Large)   Pulse 71   Wt 204 lb 12.8 oz (92.9 kg)   SpO2 97%   BMI 28.58 kg/m   Wt Readings from Last 3 Encounters:  10/05/23 204 lb 12.8 oz (92.9 kg)  07/02/23 213 lb 9.6 oz (96.9 kg)  05/13/23 219 lb (99.3 kg)    Physical Exam Vitals and nursing note reviewed.  Constitutional:      General: He is not in acute distress.    Appearance: Normal appearance. He is not ill-appearing, toxic-appearing or diaphoretic.  HENT:     Head: Normocephalic and atraumatic.     Right Ear: External ear normal.  Left Ear: External ear normal.     Nose: Nose normal.     Mouth/Throat:     Mouth: Mucous membranes are moist.     Pharynx: Oropharynx is clear.   Eyes:     General: No scleral icterus.       Right eye: No discharge.        Left eye: No discharge.     Extraocular Movements: Extraocular movements intact.     Conjunctiva/sclera: Conjunctivae normal.     Pupils: Pupils are equal, round, and reactive to light.    Cardiovascular:     Rate and Rhythm: Normal rate and regular rhythm.     Pulses: Normal pulses.     Heart sounds: Normal heart sounds. No murmur heard.    No friction rub. No gallop.  Pulmonary:     Effort: Pulmonary effort is normal. No respiratory distress.     Breath sounds: Normal breath  sounds. No stridor. No wheezing, rhonchi or rales.  Chest:     Chest wall: No tenderness.   Musculoskeletal:        General: Normal range of motion.     Cervical back: Normal range of motion and neck supple.   Skin:    General: Skin is warm and dry.     Capillary Refill: Capillary refill takes less than 2 seconds.     Coloration: Skin is not jaundiced or pale.     Findings: No bruising, erythema, lesion or rash.   Neurological:     General: No focal deficit present.     Mental Status: He is alert and oriented to person, place, and time. Mental status is at baseline.   Psychiatric:        Mood and Affect: Mood normal.        Behavior: Behavior normal.        Thought Content: Thought content normal.        Judgment: Judgment normal.     Results for orders placed or performed in visit on 07/02/23  Bayer DCA Hb A1c Waived (STAT)   Collection Time: 07/02/23  9:16 AM  Result Value Ref Range   HB A1C (BAYER DCA - WAIVED) 7.9 (H) 4.8 - 5.6 %  CBC with Differential/Platelet   Collection Time: 07/02/23  9:19 AM  Result Value Ref Range   WBC 6.9 3.4 - 10.8 x10E3/uL   RBC 5.08 4.14 - 5.80 x10E6/uL   Hemoglobin 15.2 13.0 - 17.7 g/dL   Hematocrit 40.9 81.1 - 51.0 %   MCV 92 79 - 97 fL   MCH 29.9 26.6 - 33.0 pg   MCHC 32.6 31.5 - 35.7 g/dL   RDW 91.4 78.2 - 95.6 %   Platelets 349 150 - 450 x10E3/uL   Neutrophils 60 Not Estab. %   Lymphs 25 Not Estab. %   Monocytes 7 Not Estab. %   Eos 8 Not Estab. %   Basos 0 Not Estab. %   Neutrophils Absolute 4.0 1.4 - 7.0 x10E3/uL   Lymphocytes Absolute 1.7 0.7 - 3.1 x10E3/uL   Monocytes Absolute 0.5 0.1 - 0.9 x10E3/uL   EOS (ABSOLUTE) 0.6 (H) 0.0 - 0.4 x10E3/uL   Basophils Absolute 0.0 0.0 - 0.2 x10E3/uL   Immature Granulocytes 0 Not Estab. %   Immature Grans (Abs) 0.0 0.0 - 0.1 x10E3/uL  Lipid Panel w/o Chol/HDL Ratio   Collection Time: 07/02/23  9:19 AM  Result Value Ref Range   Cholesterol, Total 180 100 - 199 mg/dL  Triglycerides  192 (H) 0 - 149 mg/dL   HDL 41 >16 mg/dL   VLDL Cholesterol Cal 33 5 - 40 mg/dL   LDL Chol Calc (NIH) 109 (H) 0 - 99 mg/dL  Comprehensive metabolic panel   Collection Time: 07/02/23  9:19 AM  Result Value Ref Range   Glucose 164 (H) 70 - 99 mg/dL   BUN 22 6 - 24 mg/dL   Creatinine, Ser 6.04 0.76 - 1.27 mg/dL   eGFR 540 >98 JX/BJY/7.82   BUN/Creatinine Ratio 26 (H) 9 - 20   Sodium 140 134 - 144 mmol/L   Potassium 4.9 3.5 - 5.2 mmol/L   Chloride 103 96 - 106 mmol/L   CO2 23 20 - 29 mmol/L   Calcium  9.3 8.7 - 10.2 mg/dL   Total Protein 6.3 6.0 - 8.5 g/dL   Albumin 4.3 3.8 - 4.9 g/dL   Globulin, Total 2.0 1.5 - 4.5 g/dL   Bilirubin Total 0.7 0.0 - 1.2 mg/dL   Alkaline Phosphatase 71 44 - 121 IU/L   AST 13 0 - 40 IU/L   ALT 21 0 - 44 IU/L  Hepatitis C Antibody   Collection Time: 07/02/23  9:19 AM  Result Value Ref Range   Hep C Virus Ab Non Reactive Non Reactive      Assessment & Plan:   Problem List Items Addressed This Visit       Digestive   Gastroesophageal reflux disease without esophagitis   Relevant Medications   omeprazole  (PRILOSEC) 20 MG capsule     Endocrine   Type 2 diabetes mellitus with hyperglycemia (HCC) - Primary   Doing great with a1c of 7.0 down from 7.9. Continue current regimen. Continue to monitor. Call with any concerns.       Relevant Medications   atorvastatin  (LIPITOR ) 80 MG tablet   empagliflozin  (JARDIANCE ) 25 MG TABS tablet   metFORMIN  (GLUCOPHAGE -XR) 500 MG 24 hr tablet   tirzepatide  (MOUNJARO ) 2.5 MG/0.5ML Pen   Other Relevant Orders   Bayer DCA Hb A1c Waived     Other   Anxiety   Acting up a bit. Will increase his wellbutrin  to 300mg  and recheck by mychart in 1 month. Refills of xanax  given. Call with any concerns.       Relevant Medications   buPROPion  (WELLBUTRIN  XL) 150 MG 24 hr tablet   ALPRAZolam  (XANAX ) 0.5 MG tablet   traZODone  (DESYREL ) 100 MG tablet     Follow up plan: Return in about 3 months (around 01/05/2024) for  physical.

## 2023-10-05 NOTE — Assessment & Plan Note (Signed)
 Acting up a bit. Will increase his wellbutrin  to 300mg  and recheck by mychart in 1 month. Refills of xanax  given. Call with any concerns.

## 2023-10-05 NOTE — Assessment & Plan Note (Signed)
 Doing great with a1c of 7.0 down from 7.9. Continue current regimen. Continue to monitor. Call with any concerns.

## 2023-11-24 NOTE — Telephone Encounter (Signed)
 Can we please sign out a 2.5mg  mounjaro  for [name] and put it aside for him?

## 2023-12-23 ENCOUNTER — Other Ambulatory Visit: Payer: Self-pay

## 2023-12-28 ENCOUNTER — Encounter: Payer: Self-pay | Admitting: Family Medicine

## 2023-12-30 NOTE — Telephone Encounter (Signed)
 Pt has picked up forms. 9/10

## 2024-01-20 ENCOUNTER — Encounter: Payer: Self-pay | Admitting: Family Medicine

## 2024-02-24 ENCOUNTER — Encounter: Payer: Self-pay | Admitting: Family Medicine

## 2024-05-03 ENCOUNTER — Other Ambulatory Visit: Payer: Self-pay | Admitting: Family Medicine

## 2024-05-04 NOTE — Telephone Encounter (Signed)
 Called and LVM asking for patient to please return my call. Need to know if patient needs refill before his upcoming appointment.   OK for E2C2 to speak to patient and find out the above information if he calls back.

## 2024-05-05 NOTE — Telephone Encounter (Signed)
 Requested medications are due for refill today.  yes  Requested medications are on the active medications list.  yes  Last refill. 10/05/2023 #30 2 rf  Future visit scheduled.   yes  Notes to clinic.  Refill not delegated.     Requested Prescriptions  Pending Prescriptions Disp Refills   ALPRAZolam  (XANAX ) 0.5 MG tablet [Pharmacy Med Name: ALPRAZOLAM  0.5 MG TABLET] 30 tablet     Sig: TAKE 1 TABLET BY MOUTH 2 TIMES DAILY AS NEEDED FOR ANXIETY.     Not Delegated - Psychiatry: Anxiolytics/Hypnotics 2 Failed - 05/05/2024  7:46 AM      Failed - This refill cannot be delegated      Failed - Urine Drug Screen completed in last 360 days      Failed - Valid encounter within last 6 months    Recent Outpatient Visits           7 months ago Type 2 diabetes mellitus with hyperglycemia, without long-term current use of insulin  (HCC)   [place] [facility], Megan P, DO   10 months ago Type 2 diabetes mellitus with hyperglycemia, without long-term current use of insulin  [facility])   [place] [facility] [place], [place], [place]              Passed - Patient is not pregnant

## 2024-05-05 NOTE — Telephone Encounter (Signed)
Patient needs refill before upcoming appointment.

## 2024-05-15 ENCOUNTER — Ambulatory Visit: Payer: Self-pay | Admitting: Family Medicine

## 2024-05-24 ENCOUNTER — Encounter: Payer: Self-pay | Admitting: Family Medicine

## 2024-05-24 ENCOUNTER — Ambulatory Visit: Payer: Self-pay | Admitting: Family Medicine

## 2024-05-24 VITALS — BP 122/75 | HR 67 | Temp 97.3°F | Resp 17 | Ht 70.98 in | Wt 207.6 lb

## 2024-05-24 DIAGNOSIS — Z23 Encounter for immunization: Secondary | ICD-10-CM

## 2024-05-24 DIAGNOSIS — Z1211 Encounter for screening for malignant neoplasm of colon: Secondary | ICD-10-CM

## 2024-05-24 DIAGNOSIS — L309 Dermatitis, unspecified: Secondary | ICD-10-CM

## 2024-05-24 DIAGNOSIS — K219 Gastro-esophageal reflux disease without esophagitis: Secondary | ICD-10-CM

## 2024-05-24 DIAGNOSIS — F419 Anxiety disorder, unspecified: Secondary | ICD-10-CM

## 2024-05-24 DIAGNOSIS — E782 Mixed hyperlipidemia: Secondary | ICD-10-CM

## 2024-05-24 DIAGNOSIS — Z0000 Encounter for general adult medical examination without abnormal findings: Secondary | ICD-10-CM

## 2024-05-24 DIAGNOSIS — E1129 Type 2 diabetes mellitus with other diabetic kidney complication: Secondary | ICD-10-CM

## 2024-05-24 LAB — MICROALBUMIN, URINE WAIVED
Creatinine, Urine Waived: 300 mg/dL (ref 10–300)
Microalb, Ur Waived: 80 mg/L — ABNORMAL HIGH (ref 0–19)

## 2024-05-24 LAB — BAYER DCA HB A1C WAIVED: HB A1C (BAYER DCA - WAIVED): 7 % — ABNORMAL HIGH (ref 4.8–5.6)

## 2024-05-24 MED ORDER — ATORVASTATIN CALCIUM 80 MG PO TABS: 80.0000 mg | ORAL_TABLET | Freq: Every day | ORAL | 1 refills | Status: AC

## 2024-05-24 MED ORDER — BUPROPION HCL ER (XL) 300 MG PO TB24: 300.0000 mg | ORAL_TABLET | Freq: Every day | ORAL | 1 refills | Status: AC

## 2024-05-24 MED ORDER — OMEPRAZOLE 20 MG PO CPDR: 20.0000 mg | DELAYED_RELEASE_CAPSULE | Freq: Every day | ORAL | 1 refills | Status: AC

## 2024-05-24 MED ORDER — METFORMIN HCL ER 500 MG PO TB24: ORAL_TABLET | ORAL | 1 refills | Status: AC

## 2024-05-24 MED ORDER — ALPRAZOLAM 0.5 MG PO TABS: 0.5000 mg | ORAL_TABLET | Freq: Two times a day (BID) | ORAL | 2 refills | Status: AC | PRN

## 2024-05-24 MED ORDER — TRIAMCINOLONE ACETONIDE 0.5 % EX OINT: 1.0000 | TOPICAL_OINTMENT | Freq: Two times a day (BID) | CUTANEOUS | 0 refills | Status: AC

## 2024-05-24 MED ORDER — TRAZODONE HCL 100 MG PO TABS: ORAL_TABLET | ORAL | 1 refills | Status: AC

## 2024-05-24 MED ORDER — EMPAGLIFLOZIN 25 MG PO TABS: 25.0000 mg | ORAL_TABLET | Freq: Every day | ORAL | 1 refills | Status: AC

## 2024-05-24 MED ORDER — TIRZEPATIDE 2.5 MG/0.5ML ~~LOC~~ SOAJ: 2.5000 mg | SUBCUTANEOUS | 1 refills | Status: AC

## 2024-05-24 NOTE — Progress Notes (Unsigned)
 "  BP 122/75 (BP Location: Left Arm, Patient Position: Sitting, Cuff Size: Normal)   Pulse 67   Temp (!) 97.3 F (36.3 C) (Oral)   Resp 17   Ht 5' 10.98 (1.803 m)   Wt 207 lb 9.6 oz (94.2 kg)   SpO2 97%   BMI 28.97 kg/m    Subjective:    Patient ID: Glenn Flynn, male    DOB: 1970/05/13, 54 y.o.   MRN: [id number]  HPI: Glenn Flynn is a 54 y.o. male presenting on 05/24/2024 for comprehensive medical examination. Current medical complaints include:{Blank single:19197::none,***}  He currently lives with: Interim Problems from his last visit: {Blank single:19197::yes,no}  Depression Screen done today and results listed below:     05/24/2024   11:08 AM 10/05/2023    8:41 AM 07/02/2023    8:48 AM 05/13/2023    8:31 AM 03/29/2023    8:13 AM  Depression screen PHQ 2/9  Decreased Interest 0 1 0 0 1  Down, Depressed, Hopeless 1 1 1 1  0  PHQ - 2 Score 1 2 1 1 1   Altered sleeping 1 3 1  0 0  Tired, decreased energy 1 1 1 1 1   Change in appetite 0 0 0 0 0  Feeling bad or failure about yourself  0 0 0 0 0  Trouble concentrating 0 0 0 0 0  Moving slowly or fidgety/restless 0 0 0 0 0  Suicidal thoughts 0 0 0 0 0  PHQ-9 Score 3 6  3  2  2    Difficult doing work/chores Somewhat difficult Not difficult at all Not difficult at all Not difficult at all Not difficult at all     Data saved with a previous flowsheet row definition    The patient {has/does not have:19849} a history of falls. I {did/did not:19850} complete a risk assessment for falls. A plan of care for falls {was/was not:19852} documented.   Past Medical History:  Past Medical History:  Diagnosis Date   Agoraphobia    Allergy    Anxiety    Depression    Diabetes mellitus without complication (HCC)    Hyperlipidemia    Migraines    NSTEMI (non-ST elevation myocardial infarction) (HCC) 03/24/2019    Surgical History:  Past Surgical History:  Procedure Laterality Date   COLONOSCOPY WITH PROPOFOL  N/A  06/10/2021   Procedure: COLONOSCOPY WITH PROPOFOL ;  Surgeon: Therisa Bi, MD;  Location: [facility] ENDOSCOPY;  Service: Gastroenterology;  Laterality: N/A;   LEFT HEART CATH AND CORONARY ANGIOGRAPHY N/A 02/21/2018   Procedure: LEFT HEART CATH AND CORONARY ANGIOGRAPHY;  Surgeon: Hester Wolm PARAS, MD;  Location: [facility] INVASIVE CV LAB;  Service: Cardiovascular;  Laterality: N/A;   LEFT HEART CATH AND CORONARY ANGIOGRAPHY N/A 03/27/2019   Procedure: LEFT HEART CATH AND CORONARY ANGIOGRAPHY and possible PCI and stent;  Surgeon: Florencio Cara BIRCH, MD;  Location: [facility] INVASIVE CV LAB;  Service: Cardiovascular;  Laterality: N/A;   TONSILLECTOMY  Age 46    Medications:  Current Outpatient Medications on File Prior to Visit  Medication Sig   ALPRAZolam  (XANAX ) 0.5 MG tablet Take 1 tablet (0.5 mg total) by mouth 2 (two) times daily as needed for anxiety.   atorvastatin  (LIPITOR ) 80 MG tablet Take 1 tablet (80 mg total) by mouth daily at 6 PM.   buPROPion  (WELLBUTRIN  XL) 150 MG 24 hr tablet Take 1 pill day 1 and 2 pills day 2 for 1-2 weeks, then increase to 2 pills daily   metFORMIN  (  GLUCOPHAGE -XR) 500 MG 24 hr tablet TAKE 2 TABLETS TWO TIMES A DAY   Multiple Vitamins-Minerals (MULTIVITAMIN WITH MINERALS) tablet Take 1 tablet by mouth daily.   omeprazole  (PRILOSEC) 20 MG capsule Take 1 capsule (20 mg total) by mouth daily.   tirzepatide  (MOUNJARO ) 2.5 MG/0.5ML Pen Inject 2.5 mg into the skin once a week. Patient is taking injection every Friday.   traZODone  (DESYREL ) 100 MG tablet TAKE 1 TABLET BY MOUTH EVERYDAY AT BEDTIME   empagliflozin  (JARDIANCE ) 25 MG TABS tablet Take 1 tablet (25 mg total) by mouth daily before breakfast.   No current facility-administered medications on file prior to visit.    Allergies:  Allergies[1]  Social History:  Social History   Socioeconomic History   Marital status: Married    Spouse name: Not on file   Number of children: Not on file   Years of education: Not on file    Highest education level: Bachelor's degree (e.g., BA, AB, BS)  Occupational History   Not on file  Tobacco Use   Smoking status: Never   Smokeless tobacco: Never  Vaping Use   Vaping status: Never Used  Substance and Sexual Activity   Alcohol use: No    Alcohol/week: 0.0 standard drinks of alcohol    Comment: rare   Drug use: No   Sexual activity: Yes  Other Topics Concern   Not on file  Social History Narrative   Not on file   Social Drivers of Health   Tobacco Use: Low Risk (05/24/2024)   Patient History    Smoking Tobacco Use: Never    Smokeless Tobacco Use: Never    Passive Exposure: Not on file  Financial Resource Strain: Low Risk (10/04/2023)   Overall Financial Resource Strain (CARDIA)    Difficulty of Paying Living Expenses: Not very hard  Food Insecurity: No Food Insecurity (10/04/2023)   Epic    Worried About Programme Researcher, Broadcasting/film/video in the Last Year: Never true    Ran Out of Food in the Last Year: Never true  Transportation Needs: No Transportation Needs (10/04/2023)   Epic    Lack of Transportation (Medical): No    Lack of Transportation (Non-Medical): No  Physical Activity: Inactive (10/04/2023)   Exercise Vital Sign    Days of Exercise per Week: 0 days    Minutes of Exercise per Session: Not on file  Stress: Stress Concern Present (10/04/2023)   [employer] of Occupational Health - Occupational Stress Questionnaire    Feeling of Stress: To some extent  Social Connections: Moderately Isolated (10/04/2023)   Social Connection and Isolation Panel    Frequency of Communication with Friends and Family: Once a week    Frequency of Social Gatherings with Friends and Family: Once a week    Attends Religious Services: More than 4 times per year    Active Member of [employer] or Organizations: No    Attends Engineer, Structural: Not on file    Marital Status: Married  Catering Manager Violence: Not on file  Depression (PHQ2-9): Low Risk (05/24/2024)    Depression (PHQ2-9)    PHQ-2 Score: 3  Alcohol Screen: Not on file  Housing: Low Risk (10/04/2023)   Epic    Unable to Pay for Housing in the Last Year: No    Number of Times Moved in the Last Year: 1    Homeless in the Last Year: No  Utilities: Not on file  Health Literacy: Not on file   Tobacco Use  History[2] Social History   Substance and Sexual Activity  Alcohol Use No   Alcohol/week: 0.0 standard drinks of alcohol   Comment: rare    Family History:  Family History  Problem Relation Age of Onset   Diabetes Mother    Hypertension Mother    Diabetes Father    Hypertension Father    Heart disease Maternal Grandmother    Heart disease Maternal Grandfather    Stroke Paternal Grandfather    Cancer Neg Hx    COPD Neg Hx     Past medical history, surgical history, medications, allergies, family history and social history reviewed with patient today and changes made to appropriate areas of the chart.   Review of Systems  Constitutional: Negative.   HENT:  Positive for sore throat. Negative for congestion, ear discharge, ear pain, hearing loss, nosebleeds, sinus pain and tinnitus.   Eyes: Negative.   Respiratory: Negative.  Negative for stridor.   Cardiovascular: Negative.   Gastrointestinal:  Positive for diarrhea and nausea. Negative for abdominal pain, blood in stool, constipation, heartburn, melena and vomiting.  Genitourinary: Negative.   Musculoskeletal: Negative.   Skin:  Positive for rash. Negative for itching.  Neurological:  Positive for headaches (at work). Negative for dizziness, tingling, tremors, sensory change, speech change, focal weakness, seizures, loss of consciousness and weakness.  Endo/Heme/Allergies: Negative.   Psychiatric/Behavioral:  Negative for depression, hallucinations, memory loss, substance abuse and suicidal ideas. The patient is nervous/anxious. The patient does not have insomnia.    All other ROS negative except what is listed above and in  the HPI.      Objective:    BP 122/75 (BP Location: Left Arm, Patient Position: Sitting, Cuff Size: Normal)   Pulse 67   Temp (!) 97.3 F (36.3 C) (Oral)   Resp 17   Ht 5' 10.98 (1.803 m)   Wt 207 lb 9.6 oz (94.2 kg)   SpO2 97%   BMI 28.97 kg/m   Wt Readings from Last 3 Encounters:  05/24/24 207 lb 9.6 oz (94.2 kg)  10/05/23 204 lb 12.8 oz (92.9 kg)  07/02/23 213 lb 9.6 oz (96.9 kg)    Physical Exam  Results for orders placed or performed in visit on 10/05/23  Bayer DCA Hb A1c Waived   Collection Time: 10/05/23  8:44 AM  Result Value Ref Range   HB A1C (BAYER DCA - WAIVED) 7.0 (H) 4.8 - 5.6 %      Assessment & Plan:   Problem List Items Addressed This Visit       Endocrine   Type 2 diabetes mellitus with microalbuminuria, without long-term current use of insulin  (HCC)   Relevant Orders   Microalbumin, Urine Waived   Bayer DCA Hb A1c Waived     Other   Hyperlipidemia   Other Visit Diagnoses       Routine general medical examination at a health care facility    -  Primary   Relevant Orders   Comprehensive metabolic panel with GFR   CBC with Differential/Platelet   Lipid Panel w/o Chol/HDL Ratio   PSA   TSH   Hepatitis B surface antibody,quantitative        LABORATORY TESTING:  Health maintenance labs ordered today as discussed above.   The natural history of prostate cancer and ongoing controversy regarding screening and potential treatment outcomes of prostate cancer has been discussed with the patient. The meaning of a false positive PSA and a false negative PSA has been discussed. He  indicates understanding of the limitations of this screening test and wishes to proceed with screening PSA testing.   IMMUNIZATIONS:   - Tdap: Tetanus vaccination status reviewed: {tetanus status:315746}. - Influenza: {Blank single:19197::Up to date,Administered today,Postponed to flu season,Refused,Given elsewhere} - Pneumovax: {Blank single:19197::Up to  date,Administered today,Not applicable,Refused,Given elsewhere} - Prevnar: {Blank single:19197::Up to date,Administered today,Not applicable,Refused,Given elsewhere} - COVID: {Blank single:19197::Up to date,Administered today,Not applicable,Refused,Given elsewhere} - HPV: {Blank single:19197::Up to date,Administered today,Not applicable,Refused,Given elsewhere} - Shingrix  vaccine: {Blank single:19197::Up to date,Administered today,Not applicable,Refused,Given elsewhere}  SCREENING: - Colonoscopy: {Blank single:19197::Up to date,Ordered today,Not applicable,Refused,Done elsewhere}  Discussed with patient purpose of the colonoscopy is to detect colon cancer at curable precancerous or early stages   PATIENT COUNSELING:    Sexuality: Discussed sexually transmitted diseases, partner selection, use of condoms, avoidance of unintended pregnancy  and contraceptive alternatives.   Advised to avoid cigarette smoking.  I discussed with the patient that most people either abstain from alcohol or drink within safe limits (<=14/week and <=4 drinks/occasion for males, <=7/weeks and <= 3 drinks/occasion for females) and that the risk for alcohol disorders and other health effects rises proportionally with the number of drinks per week and how often a drinker exceeds daily limits.  Discussed cessation/primary prevention of drug use and availability of treatment for abuse.   Diet: Encouraged to adjust caloric intake to maintain  or achieve ideal body weight, to reduce intake of dietary saturated fat and total fat, to limit sodium intake by avoiding high sodium foods and not adding table salt, and to maintain adequate dietary potassium and calcium  preferably from fresh fruits, vegetables, and low-fat dairy products.    stressed the importance of regular exercise  Injury prevention: Discussed safety belts, safety helmets, smoke detector, smoking near  bedding or upholstery.   Dental health: Discussed importance of regular tooth brushing, flossing, and dental visits.   Follow up plan: NEXT PREVENTATIVE PHYSICAL DUE IN 1 YEAR. No follow-ups on file.     [1]  Allergies Allergen Reactions   Semaglutide  Nausea Only  [2]  Social History Tobacco Use  Smoking Status Never  Smokeless Tobacco Never   "

## 2024-05-25 ENCOUNTER — Ambulatory Visit: Payer: Self-pay | Admitting: Family Medicine

## 2024-05-25 DIAGNOSIS — L309 Dermatitis, unspecified: Secondary | ICD-10-CM | POA: Insufficient documentation

## 2024-05-25 LAB — LIPID PANEL W/O CHOL/HDL RATIO
Cholesterol, Total: 180 mg/dL (ref 100–199)
HDL: 45 mg/dL
LDL Chol Calc (NIH): 107 mg/dL — ABNORMAL HIGH (ref 0–99)
Triglycerides: 161 mg/dL — ABNORMAL HIGH (ref 0–149)
VLDL Cholesterol Cal: 28 mg/dL (ref 5–40)

## 2024-05-25 LAB — CBC WITH DIFFERENTIAL/PLATELET
Basophils Absolute: 0 10*3/uL (ref 0.0–0.2)
Basos: 0 %
EOS (ABSOLUTE): 0.4 10*3/uL (ref 0.0–0.4)
Eos: 6 %
Hematocrit: 47 % (ref 37.5–51.0)
Hemoglobin: 15.7 g/dL (ref 13.0–17.7)
Immature Grans (Abs): 0 10*3/uL (ref 0.0–0.1)
Immature Granulocytes: 0 %
Lymphocytes Absolute: 1.8 10*3/uL (ref 0.7–3.1)
Lymphs: 23 %
MCH: 30.1 pg (ref 26.6–33.0)
MCHC: 33.4 g/dL (ref 31.5–35.7)
MCV: 90 fL (ref 79–97)
Monocytes Absolute: 0.6 10*3/uL (ref 0.1–0.9)
Monocytes: 8 %
Neutrophils Absolute: 5 10*3/uL (ref 1.4–7.0)
Neutrophils: 63 %
Platelets: 331 10*3/uL (ref 150–450)
RBC: 5.21 x10E6/uL (ref 4.14–5.80)
RDW: 12.4 % (ref 11.6–15.4)
WBC: 7.9 10*3/uL (ref 3.4–10.8)

## 2024-05-25 LAB — COMPREHENSIVE METABOLIC PANEL WITH GFR
ALT: 18 [IU]/L (ref 0–44)
AST: 12 [IU]/L (ref 0–40)
Albumin: 4.3 g/dL (ref 3.8–4.9)
Alkaline Phosphatase: 80 [IU]/L (ref 47–123)
BUN/Creatinine Ratio: 29 — ABNORMAL HIGH (ref 9–20)
BUN: 23 mg/dL (ref 6–24)
Bilirubin Total: 0.6 mg/dL (ref 0.0–1.2)
CO2: 20 mmol/L (ref 20–29)
Calcium: 9.6 mg/dL (ref 8.7–10.2)
Chloride: 103 mmol/L (ref 96–106)
Creatinine, Ser: 0.78 mg/dL (ref 0.76–1.27)
Globulin, Total: 2.3 g/dL (ref 1.5–4.5)
Glucose: 176 mg/dL — ABNORMAL HIGH (ref 70–99)
Potassium: 4.8 mmol/L (ref 3.5–5.2)
Sodium: 139 mmol/L (ref 134–144)
Total Protein: 6.6 g/dL (ref 6.0–8.5)
eGFR: 105 mL/min/{1.73_m2}

## 2024-05-25 LAB — TSH: TSH: 1.1 u[IU]/mL (ref 0.450–4.500)

## 2024-05-25 LAB — HEPATITIS B SURFACE ANTIBODY, QUANTITATIVE: Hepatitis B Surf Ab Quant: <3.5 m[IU]/mL — ABNORMAL LOW

## 2024-05-25 LAB — PSA: Prostate Specific Ag, Serum: 0.9 ng/mL (ref 0.0–4.0)

## 2024-05-25 NOTE — Assessment & Plan Note (Signed)
 Will treat with triamcinalone ointment. Call if not getting better or getting worse.

## 2024-05-25 NOTE — Assessment & Plan Note (Signed)
 In exacerbation. Will monitor and give a few more xanax  a month. Call with any concerns. Continue to monitor. Recheck  3 months.

## 2024-05-25 NOTE — Assessment & Plan Note (Signed)
 Under good control on current regimen. Continue current regimen. Continue to monitor. Call with any concerns. Refills given. Labs drawn today.

## 2024-05-25 NOTE — Assessment & Plan Note (Signed)
 A1c stable at 7.0- will restart his jardiance  and continue current regimen. Recheck 3 months. Call with any concerns.

## 2024-06-08 ENCOUNTER — Ambulatory Visit: Payer: Self-pay | Admitting: Family Medicine

## 2024-06-13 ENCOUNTER — Ambulatory Visit: Payer: Self-pay | Admitting: Family Medicine

## 2024-08-21 ENCOUNTER — Ambulatory Visit: Admitting: Family Medicine
